# Patient Record
Sex: Male | Born: 1935 | Race: White | Hispanic: No | Smoking: Never smoker
Health system: Southern US, Community
[De-identification: ages and names within clinical notes are randomized; demographics above are authoritative.]

## PROBLEM LIST (undated history)

## (undated) DIAGNOSIS — Z87442 Personal history of urinary calculi: Secondary | ICD-10-CM

## (undated) DIAGNOSIS — I10 Essential (primary) hypertension: Secondary | ICD-10-CM

## (undated) DIAGNOSIS — E785 Hyperlipidemia, unspecified: Secondary | ICD-10-CM

## (undated) DIAGNOSIS — H269 Unspecified cataract: Secondary | ICD-10-CM

## (undated) DIAGNOSIS — I509 Heart failure, unspecified: Secondary | ICD-10-CM

## (undated) DIAGNOSIS — N200 Calculus of kidney: Secondary | ICD-10-CM

## (undated) DIAGNOSIS — R011 Cardiac murmur, unspecified: Secondary | ICD-10-CM

## (undated) DIAGNOSIS — I341 Nonrheumatic mitral (valve) prolapse: Secondary | ICD-10-CM

## (undated) DIAGNOSIS — I2510 Atherosclerotic heart disease of native coronary artery without angina pectoris: Secondary | ICD-10-CM

## (undated) DIAGNOSIS — K5790 Diverticulosis of intestine, part unspecified, without perforation or abscess without bleeding: Secondary | ICD-10-CM

## (undated) DIAGNOSIS — R339 Retention of urine, unspecified: Secondary | ICD-10-CM

## (undated) DIAGNOSIS — I219 Acute myocardial infarction, unspecified: Secondary | ICD-10-CM

## (undated) HISTORY — DX: Heart failure, unspecified: I50.9

## (undated) HISTORY — DX: Cardiac murmur, unspecified: R01.1

## (undated) HISTORY — DX: Diverticulosis of intestine, part unspecified, without perforation or abscess without bleeding: K57.90

## (undated) HISTORY — DX: Unspecified cataract: H26.9

## (undated) HISTORY — DX: Nonrheumatic mitral (valve) prolapse: I34.1

## (undated) HISTORY — PX: CORONARY ANGIOPLASTY: SHX604

## (undated) HISTORY — DX: Hyperlipidemia, unspecified: E78.5

## (undated) HISTORY — PX: CORONARY ARTERY BYPASS GRAFT: SHX141

## (undated) HISTORY — DX: Calculus of kidney: N20.0

## (undated) HISTORY — DX: Essential (primary) hypertension: I10

## (undated) HISTORY — DX: Atherosclerotic heart disease of native coronary artery without angina pectoris: I25.10

## (undated) HISTORY — PX: MITRAL VALVE REPAIR: SHX2039

## (undated) HISTORY — DX: Acute myocardial infarction, unspecified: I21.9

## (undated) HISTORY — PX: CARDIAC CATHETERIZATION: SHX172

---

## 2001-10-28 ENCOUNTER — Emergency Department (HOSPITAL_COMMUNITY): Admission: EM | Admit: 2001-10-28 | Discharge: 2001-10-28 | Payer: Self-pay | Admitting: Emergency Medicine

## 2001-10-28 ENCOUNTER — Encounter: Payer: Self-pay | Admitting: Emergency Medicine

## 2003-06-12 DIAGNOSIS — K5790 Diverticulosis of intestine, part unspecified, without perforation or abscess without bleeding: Secondary | ICD-10-CM

## 2003-06-12 HISTORY — DX: Diverticulosis of intestine, part unspecified, without perforation or abscess without bleeding: K57.90

## 2004-12-15 ENCOUNTER — Ambulatory Visit: Payer: Self-pay | Admitting: Cardiology

## 2004-12-29 ENCOUNTER — Ambulatory Visit: Payer: Self-pay

## 2005-05-26 ENCOUNTER — Ambulatory Visit: Payer: Self-pay | Admitting: Cardiology

## 2005-07-13 ENCOUNTER — Ambulatory Visit: Payer: Self-pay | Admitting: Cardiology

## 2005-11-02 ENCOUNTER — Ambulatory Visit: Payer: Self-pay | Admitting: Cardiology

## 2005-12-28 ENCOUNTER — Encounter: Payer: Self-pay | Admitting: Cardiology

## 2005-12-28 ENCOUNTER — Ambulatory Visit: Payer: Self-pay

## 2006-05-17 ENCOUNTER — Ambulatory Visit: Payer: Self-pay | Admitting: Cardiology

## 2006-11-29 ENCOUNTER — Ambulatory Visit: Payer: Self-pay | Admitting: Cardiology

## 2006-11-29 ENCOUNTER — Encounter: Payer: Self-pay | Admitting: Cardiology

## 2006-11-29 ENCOUNTER — Ambulatory Visit: Payer: Self-pay

## 2006-12-27 ENCOUNTER — Ambulatory Visit: Payer: Self-pay | Admitting: Cardiology

## 2006-12-27 ENCOUNTER — Ambulatory Visit (HOSPITAL_COMMUNITY): Admission: RE | Admit: 2006-12-27 | Discharge: 2006-12-27 | Payer: Self-pay | Admitting: Cardiology

## 2007-01-17 ENCOUNTER — Ambulatory Visit: Payer: Self-pay | Admitting: Cardiology

## 2007-02-27 ENCOUNTER — Ambulatory Visit: Payer: Self-pay | Admitting: Cardiology

## 2007-02-27 LAB — CONVERTED CEMR LAB
Chloride: 109 meq/L (ref 96–112)
Creatinine, Ser: 1.2 mg/dL (ref 0.4–1.5)
Eosinophils Absolute: 0.2 10*3/uL (ref 0.0–0.6)
Eosinophils Relative: 6.7 % — ABNORMAL HIGH (ref 0.0–5.0)
Glucose, Bld: 84 mg/dL (ref 70–99)
HCT: 38.6 % — ABNORMAL LOW (ref 39.0–52.0)
Hemoglobin: 13 g/dL (ref 13.0–17.0)
MCV: 94.6 fL (ref 78.0–100.0)
Monocytes Absolute: 0.5 10*3/uL (ref 0.2–0.7)
Neutrophils Relative %: 40.7 % — ABNORMAL LOW (ref 43.0–77.0)
RBC: 4.08 M/uL — ABNORMAL LOW (ref 4.22–5.81)
RDW: 12.6 % (ref 11.5–14.6)
Sodium: 142 meq/L (ref 135–145)
WBC: 3.5 10*3/uL — ABNORMAL LOW (ref 4.5–10.5)

## 2007-03-06 ENCOUNTER — Ambulatory Visit: Payer: Self-pay | Admitting: Cardiology

## 2007-03-06 ENCOUNTER — Inpatient Hospital Stay (HOSPITAL_BASED_OUTPATIENT_CLINIC_OR_DEPARTMENT_OTHER): Admission: RE | Admit: 2007-03-06 | Discharge: 2007-03-06 | Payer: Self-pay | Admitting: Cardiology

## 2007-03-06 ENCOUNTER — Observation Stay (HOSPITAL_COMMUNITY): Admission: RE | Admit: 2007-03-06 | Discharge: 2007-03-07 | Payer: Self-pay | Admitting: Cardiology

## 2007-03-22 ENCOUNTER — Ambulatory Visit: Payer: Self-pay | Admitting: Cardiology

## 2007-03-28 ENCOUNTER — Ambulatory Visit: Payer: Self-pay | Admitting: Thoracic Surgery (Cardiothoracic Vascular Surgery)

## 2007-04-05 ENCOUNTER — Ambulatory Visit (HOSPITAL_COMMUNITY)
Admission: RE | Admit: 2007-04-05 | Discharge: 2007-04-05 | Payer: Self-pay | Admitting: Thoracic Surgery (Cardiothoracic Vascular Surgery)

## 2007-04-05 ENCOUNTER — Encounter: Payer: Self-pay | Admitting: Thoracic Surgery (Cardiothoracic Vascular Surgery)

## 2007-04-09 ENCOUNTER — Inpatient Hospital Stay (HOSPITAL_COMMUNITY)
Admission: RE | Admit: 2007-04-09 | Discharge: 2007-04-13 | Payer: Self-pay | Admitting: Thoracic Surgery (Cardiothoracic Vascular Surgery)

## 2007-04-09 ENCOUNTER — Encounter: Payer: Self-pay | Admitting: Thoracic Surgery (Cardiothoracic Vascular Surgery)

## 2007-04-11 ENCOUNTER — Ambulatory Visit: Payer: Self-pay | Admitting: Thoracic Surgery (Cardiothoracic Vascular Surgery)

## 2007-04-15 ENCOUNTER — Encounter
Admission: RE | Admit: 2007-04-15 | Discharge: 2007-04-15 | Payer: Self-pay | Admitting: Thoracic Surgery (Cardiothoracic Vascular Surgery)

## 2007-04-15 ENCOUNTER — Ambulatory Visit: Payer: Self-pay | Admitting: Thoracic Surgery (Cardiothoracic Vascular Surgery)

## 2007-04-19 ENCOUNTER — Encounter
Admission: RE | Admit: 2007-04-19 | Discharge: 2007-04-19 | Payer: Self-pay | Admitting: Thoracic Surgery (Cardiothoracic Vascular Surgery)

## 2007-04-22 ENCOUNTER — Encounter
Admission: RE | Admit: 2007-04-22 | Discharge: 2007-04-22 | Payer: Self-pay | Admitting: Thoracic Surgery (Cardiothoracic Vascular Surgery)

## 2007-04-22 ENCOUNTER — Ambulatory Visit: Payer: Self-pay | Admitting: Thoracic Surgery (Cardiothoracic Vascular Surgery)

## 2007-04-25 ENCOUNTER — Ambulatory Visit: Payer: Self-pay | Admitting: Cardiovascular Disease

## 2007-04-25 ENCOUNTER — Ambulatory Visit: Payer: Self-pay

## 2007-04-25 ENCOUNTER — Encounter: Payer: Self-pay | Admitting: Cardiology

## 2007-05-01 ENCOUNTER — Ambulatory Visit: Payer: Self-pay | Admitting: Cardiology

## 2007-05-02 LAB — CONVERTED CEMR LAB
BUN: 31 mg/dL — ABNORMAL HIGH (ref 6–23)
Basophils Absolute: 0.2 10*3/uL — ABNORMAL HIGH (ref 0.0–0.1)
Eosinophils Absolute: 1.5 10*3/uL — ABNORMAL HIGH (ref 0.0–0.6)
GFR calc Af Amer: 63 mL/min
GFR calc non Af Amer: 52 mL/min
Hemoglobin: 11.1 g/dL — ABNORMAL LOW (ref 13.0–17.0)
Lymphocytes Relative: 18.4 % (ref 12.0–46.0)
MCHC: 32.3 g/dL (ref 30.0–36.0)
Monocytes Absolute: 0.4 10*3/uL (ref 0.2–0.7)
Monocytes Relative: 6.9 % (ref 3.0–11.0)
Neutro Abs: 2.7 10*3/uL (ref 1.4–7.7)
Potassium: 4.8 meq/L (ref 3.5–5.1)

## 2007-05-08 ENCOUNTER — Encounter: Admission: RE | Admit: 2007-05-08 | Discharge: 2007-05-08 | Payer: Self-pay | Admitting: Cardiology

## 2007-05-10 ENCOUNTER — Ambulatory Visit: Payer: Self-pay | Admitting: Cardiology

## 2007-05-14 ENCOUNTER — Ambulatory Visit: Payer: Self-pay | Admitting: Cardiology

## 2007-05-14 LAB — CONVERTED CEMR LAB
Basophils Absolute: 0.1 10*3/uL (ref 0.0–0.1)
Chloride: 106 meq/L (ref 96–112)
Creatinine, Ser: 1.3 mg/dL (ref 0.4–1.5)
HCT: 34.8 % — ABNORMAL LOW (ref 39.0–52.0)
MCHC: 32.3 g/dL (ref 30.0–36.0)
Neutrophils Relative %: 47.2 % (ref 43.0–77.0)
RBC: 3.71 M/uL — ABNORMAL LOW (ref 4.22–5.81)
RDW: 13.6 % (ref 11.5–14.6)
Sodium: 140 meq/L (ref 135–145)
WBC: 4.7 10*3/uL (ref 4.5–10.5)

## 2007-05-20 ENCOUNTER — Encounter
Admission: RE | Admit: 2007-05-20 | Discharge: 2007-05-20 | Payer: Self-pay | Admitting: Thoracic Surgery (Cardiothoracic Vascular Surgery)

## 2007-05-20 ENCOUNTER — Ambulatory Visit: Payer: Self-pay | Admitting: Thoracic Surgery (Cardiothoracic Vascular Surgery)

## 2007-05-28 ENCOUNTER — Encounter (HOSPITAL_COMMUNITY): Admission: RE | Admit: 2007-05-28 | Discharge: 2007-08-26 | Payer: Self-pay | Admitting: Cardiology

## 2007-06-18 ENCOUNTER — Ambulatory Visit: Payer: Self-pay | Admitting: Cardiology

## 2007-06-18 LAB — CONVERTED CEMR LAB
BUN: 28 mg/dL — ABNORMAL HIGH (ref 6–23)
Basophils Relative: 0 % (ref 0.0–1.0)
Calcium: 9.5 mg/dL (ref 8.4–10.5)
Creatinine, Ser: 1.2 mg/dL (ref 0.4–1.5)
Eosinophils Absolute: 0.2 10*3/uL (ref 0.0–0.7)
Eosinophils Relative: 4.8 % (ref 0.0–5.0)
GFR calc Af Amer: 76 mL/min
GFR calc non Af Amer: 63 mL/min
HCT: 38.8 % — ABNORMAL LOW (ref 39.0–52.0)
MCV: 91.4 fL (ref 78.0–100.0)
Monocytes Absolute: 0.7 10*3/uL (ref 0.1–1.0)
Neutro Abs: 1.9 10*3/uL (ref 1.4–7.7)
Platelets: 216 10*3/uL (ref 150–400)
RBC: 4.25 M/uL (ref 4.22–5.81)
WBC: 4 10*3/uL — ABNORMAL LOW (ref 4.5–10.5)

## 2007-07-26 ENCOUNTER — Ambulatory Visit: Payer: Self-pay | Admitting: Cardiology

## 2007-07-26 LAB — CONVERTED CEMR LAB
Basophils Absolute: 0 10*3/uL (ref 0.0–0.1)
CO2: 28 meq/L (ref 19–32)
GFR calc Af Amer: 76 mL/min
Glucose, Bld: 137 mg/dL — ABNORMAL HIGH (ref 70–99)
Lymphocytes Relative: 32 % (ref 12.0–46.0)
Monocytes Relative: 13.8 % — ABNORMAL HIGH (ref 3.0–12.0)
Platelets: 200 10*3/uL (ref 150–400)
Potassium: 4.3 meq/L (ref 3.5–5.1)
RDW: 13.6 % (ref 11.5–14.6)
Sodium: 141 meq/L (ref 135–145)

## 2007-07-31 ENCOUNTER — Ambulatory Visit: Payer: Self-pay | Admitting: Cardiology

## 2007-08-28 ENCOUNTER — Encounter (HOSPITAL_COMMUNITY): Admission: RE | Admit: 2007-08-28 | Discharge: 2007-11-26 | Payer: Self-pay | Admitting: Cardiology

## 2007-10-23 ENCOUNTER — Encounter: Payer: Self-pay | Admitting: Cardiology

## 2007-10-23 ENCOUNTER — Ambulatory Visit: Payer: Self-pay

## 2007-10-30 ENCOUNTER — Ambulatory Visit: Payer: Self-pay | Admitting: Cardiology

## 2007-11-18 ENCOUNTER — Ambulatory Visit: Payer: Self-pay | Admitting: Thoracic Surgery (Cardiothoracic Vascular Surgery)

## 2007-11-28 ENCOUNTER — Encounter (HOSPITAL_COMMUNITY): Admission: RE | Admit: 2007-11-28 | Discharge: 2008-02-24 | Payer: Self-pay | Admitting: Cardiology

## 2008-02-28 ENCOUNTER — Encounter (HOSPITAL_COMMUNITY): Admission: RE | Admit: 2008-02-28 | Discharge: 2008-04-27 | Payer: Self-pay | Admitting: Cardiology

## 2008-03-11 ENCOUNTER — Ambulatory Visit: Payer: Self-pay | Admitting: Cardiology

## 2008-03-11 DIAGNOSIS — E785 Hyperlipidemia, unspecified: Secondary | ICD-10-CM | POA: Insufficient documentation

## 2008-03-11 DIAGNOSIS — I25810 Atherosclerosis of coronary artery bypass graft(s) without angina pectoris: Secondary | ICD-10-CM | POA: Insufficient documentation

## 2008-03-11 DIAGNOSIS — E7800 Pure hypercholesterolemia, unspecified: Secondary | ICD-10-CM | POA: Insufficient documentation

## 2008-03-25 ENCOUNTER — Ambulatory Visit: Payer: Self-pay | Admitting: Internal Medicine

## 2008-03-25 ENCOUNTER — Encounter: Payer: Self-pay | Admitting: Internal Medicine

## 2008-07-16 ENCOUNTER — Ambulatory Visit: Payer: Self-pay | Admitting: Cardiology

## 2008-07-16 DIAGNOSIS — I059 Rheumatic mitral valve disease, unspecified: Secondary | ICD-10-CM | POA: Insufficient documentation

## 2008-07-21 ENCOUNTER — Telehealth: Payer: Self-pay | Admitting: Cardiology

## 2008-08-03 IMAGING — CR DG CHEST 2V
2 series · 2 of 2 positions shown · non-contrast
Comparison: 05/08/07

CLINICAL DATA: Post CABG with cough and shortness of breath. 
 CHEST - 2 VIEW:

[w chest pa]
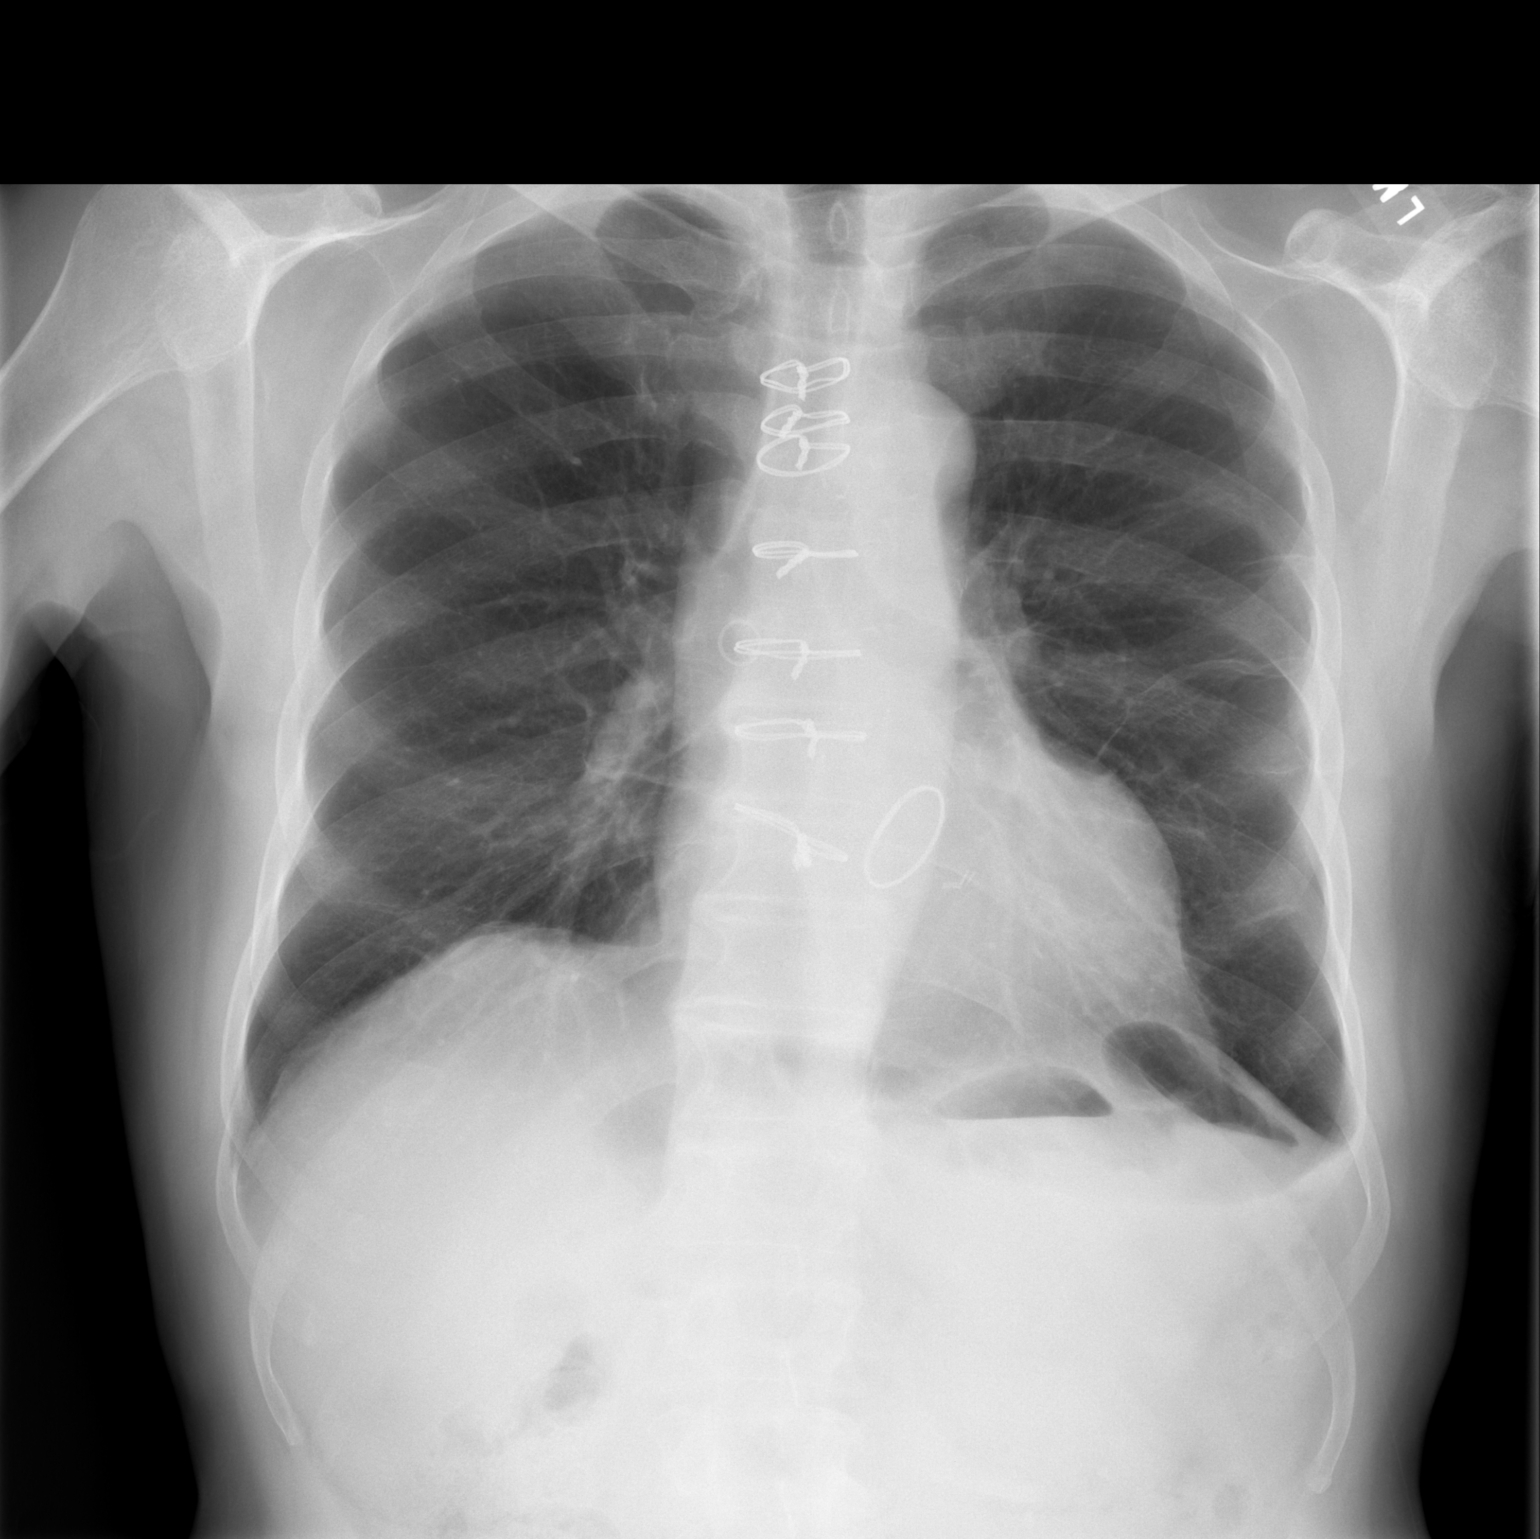

[w chest lat]
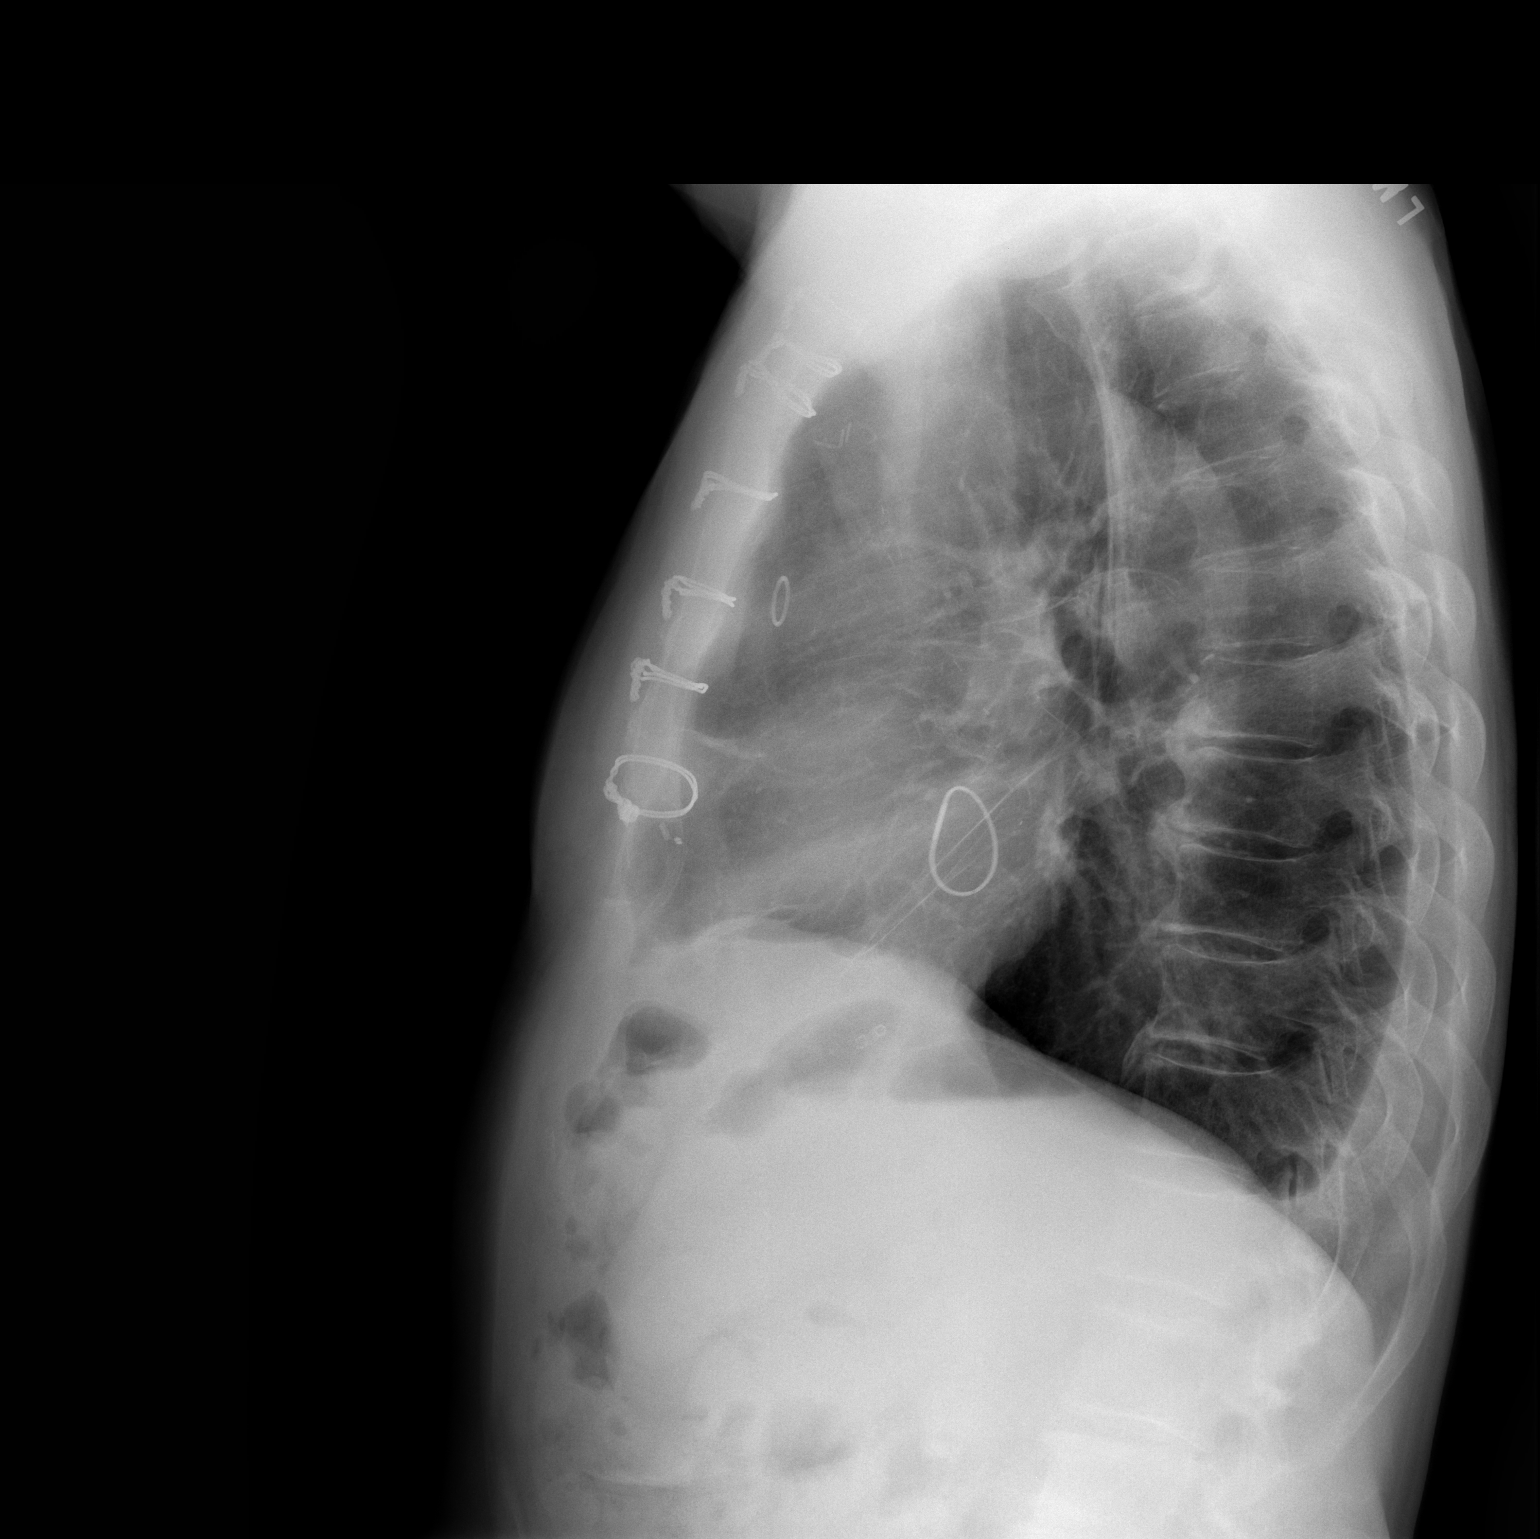

[2 of 2 positions shown; findings below may reference images not displayed]

FINDINGS: Trachea is midline.  Heart size stable.  Sternotomy wires are unchanged in position and alignment.  Pleural parenchymal scarring is seen in the left hemithorax.  Cannot exclude a tiny left pleural effusion.  Right lung clear.
IMPRESSION: Pleural parenchymal scarring in the left hemithorax.  Cannot exclude a tiny left pleural effusion.

## 2008-12-31 ENCOUNTER — Telehealth: Payer: Self-pay | Admitting: Cardiology

## 2009-01-13 ENCOUNTER — Ambulatory Visit: Payer: Self-pay | Admitting: Cardiology

## 2009-01-13 DIAGNOSIS — R0602 Shortness of breath: Secondary | ICD-10-CM | POA: Insufficient documentation

## 2009-01-28 ENCOUNTER — Encounter: Payer: Self-pay | Admitting: Cardiology

## 2009-01-28 ENCOUNTER — Ambulatory Visit: Payer: Self-pay

## 2009-01-28 ENCOUNTER — Ambulatory Visit: Payer: Self-pay | Admitting: Cardiology

## 2009-01-28 ENCOUNTER — Ambulatory Visit (HOSPITAL_COMMUNITY): Admission: RE | Admit: 2009-01-28 | Discharge: 2009-01-28 | Payer: Self-pay | Admitting: Cardiology

## 2009-02-09 LAB — CONVERTED CEMR LAB
CO2: 28 meq/L (ref 19–32)
Chloride: 108 meq/L (ref 96–112)
Creatinine, Ser: 1.2 mg/dL (ref 0.4–1.5)
Potassium: 4.9 meq/L (ref 3.5–5.1)

## 2009-06-30 ENCOUNTER — Ambulatory Visit: Payer: Self-pay | Admitting: Cardiology

## 2009-11-25 ENCOUNTER — Telehealth: Payer: Self-pay | Admitting: Cardiology

## 2009-12-22 ENCOUNTER — Ambulatory Visit: Payer: Self-pay | Admitting: Ophthalmology

## 2009-12-22 ENCOUNTER — Telehealth (INDEPENDENT_AMBULATORY_CARE_PROVIDER_SITE_OTHER): Payer: Self-pay | Admitting: *Deleted

## 2009-12-29 ENCOUNTER — Ambulatory Visit: Payer: Self-pay | Admitting: Ophthalmology

## 2010-01-19 ENCOUNTER — Ambulatory Visit: Payer: Self-pay | Admitting: Internal Medicine

## 2010-01-19 ENCOUNTER — Encounter: Payer: Self-pay | Admitting: Cardiology

## 2010-01-19 ENCOUNTER — Ambulatory Visit: Payer: Self-pay | Admitting: Cardiology

## 2010-01-19 ENCOUNTER — Ambulatory Visit: Payer: Self-pay

## 2010-01-19 ENCOUNTER — Ambulatory Visit (HOSPITAL_COMMUNITY): Admission: RE | Admit: 2010-01-19 | Discharge: 2010-01-19 | Payer: Self-pay | Admitting: Cardiology

## 2010-03-27 LAB — CONVERTED CEMR LAB
BUN: 24 mg/dL — ABNORMAL HIGH (ref 6–23)
Chloride: 106 meq/L (ref 96–112)
Magnesium: 2.3 mg/dL (ref 1.5–2.5)
Potassium: 4.2 meq/L (ref 3.5–5.1)
Sodium: 138 meq/L (ref 135–145)

## 2010-03-29 NOTE — Assessment & Plan Note (Signed)
Summary: [name] [name]   Visit Type:  Follow-up Primary Provider:  [facility]   History of Present Illness: no cardiology complaints patient had a echo done today 6 mth fu.  Overall he is doing pretty well.  He is able to do most things without any significant symptoms.  He has mild shortness of breath when he goes up stairs or does alot of activity, but it is not limiting.  Echo done today in office.   Current Medications (verified): 1)  Plavix 75 Mg Tabs (Clopidogrel Bisulfate) .... Take One Tablet By Mouth Daily 2)  Metoprolol Succinate 25 Mg Xr24h-Tab (Metoprolol Succinate) .... Take One Tablet By Mouth Daily 3)  Potassium Chloride Cr 10 Meq Cr-Tabs (Potassium Chloride) .Marland Kitchen.. 1 Tab Every Other Day 4)  Simvastatin 80 Mg Tabs (Simvastatin) .... Take 1/2 Tablet At Bedtime 5)  Furosemide 20 Mg Tabs (Furosemide) .... Take One Tablet By Mouth Every Other Day. 6)  Multivitamins  Tabs (Multiple Vitamin) .... Take 1 Tablet By Mouth Once A Day 7)  Aspirin 81 Mg Tbec (Aspirin) .... Take One Tablet By Mouth Daily 8)  Triazolam 0.25 Mg Tabs (Triazolam) .... Take 1 Tab By Mouth At Bedtime  Allergies (verified): No Known Drug Allergies  Comments:  Nurse/Medical Assistant: patient brought med list we reviewed stated all meds are correct  Past History:  Past Medical History: Last updated: 03/11/2008 hx of acute mi v12.59 hx of murmurs 785.2 hx of kidney  stones CAD Hyperlipidemia Hypertension  Past Surgical History: Last updated: 03/11/2008 CABG 2/09 Valve Repair-Mitral (S/P) 2/09  Family History: Last updated: 03/11/2008 Family History of Hypertension:   Social History: Last updated: 03/11/2008 Full Time Married  Tobacco Use - No.   Vital Signs:  Patient profile:   77 year old male Weight:      154 pounds BMI:     23.50 Pulse rate:   44 / minute BP sitting:   122 / 60  (right arm)  Vitals Entered By: Dreama Saa, CNA (January 19, 2010 11:31 AM)  Physical Exam  General:   Well developed, well nourished, in no acute distress. Head:  normocephalic and atraumatic Eyes:  PERRLA/EOM intact; conjunctiva and lids normal. Lungs:  Clear bilaterally to auscultation and percussion. Heart:  Normal S1 and S2. 1-2/6  SEM most notable at apical area.   No evident DM.   No rub or gallop. Pulses:  pulses normal in all 4 extremities Extremities:  No clubbing or cyanosis. Neurologic:  Alert and oriented x 3.   EKG  Procedure date:  01/19/2010  Findings:      Sinus bradycardia.  Incomplete RBBB.  leftward axis.  Echocardiogram  Procedure date:  01/19/2010  Findings:       Left ventricle: The cavity size was normal. Wall thickness was       increased in a pattern of moderate LVH. Systolic function was       normal. The estimated ejection fraction was in the range of 50% to       55%. Hypokinesis of the inferior and inferoseptal myocardium.     - Ventricular septum: Septal motion showed abnormal function and       dyssynergy.     - Aortic valve: Transvalvular velocity was minimally increased.       There was very mild stenosis. Mild regurgitation. Valve area:       2.1cm 2(VTI). Valve area: 2.24cm 2 (Vmax).     - Mitral valve: The findings are consistent with mild to  moderate       stenosis. Valve area by pressure half-time: 1.33cm 2. Valve area       by continuity equation (using LVOT flow): 1.9cm 2.     - Left atrium: The atrium was moderately dilated.     - Right atrium: The atrium was moderately dilated.  Impression & Recommendations:  Problem # 1:  MITRAL VALVE PROLAPSE, NON-RHEUMATIC (ICD-424.0) status post repair with findings on echo as noted.  Mild exertional dyspnea poss secondary residual impact of valve repair.  Not limited in significant way. His updated medication list for this problem includes:    Metoprolol Succinate 25 Mg Xr24h-tab (Metoprolol succinate) .Marland Kitchen... Take one tablet by mouth daily    Furosemide 20 Mg Tabs (Furosemide) .Marland Kitchen... Take one  tablet by mouth every other day.  Problem # 2:  CAD, ARTERY BYPASS GRAFT (ICD-414.04) No current angina. stable overall status His updated medication list for this problem includes:    Plavix 75 Mg Tabs (Clopidogrel bisulfate) .Marland Kitchen... Take one tablet by mouth daily    Metoprolol Succinate 25 Mg Xr24h-tab (Metoprolol succinate) .Marland Kitchen... Take one tablet by mouth daily    Aspirin 81 Mg Tbec (Aspirin) .Marland Kitchen... Take one tablet by mouth daily  Problem # 3:  HYPERLIPIDEMIA-MIXED (ICD-272.4) Followed by Dr. Chilton Si.  His updated medication list for this problem includes:    Simvastatin 80 Mg Tabs (Simvastatin) .Marland Kitchen... Take 1/2 tablet at bedtime  Patient Instructions: 1)  Your physician recommends that you schedule a follow-up appointment in: 6 months. 2)  Your physician recommends that you continue on your current medications as directed. Please refer to the Current Medication list given to you today.

## 2010-03-29 NOTE — Progress Notes (Signed)
Summary: Records Request  Faxed EKG to [facility] at [facility] Pre-Admit Testing ([phone]). Debby Freiberg  December 22, 2009 12:43 PM

## 2010-03-29 NOTE — Progress Notes (Signed)
Summary: pt's eye dr calling re going off plavix for 7 days  Phone Note From Other Clinic   Caller: mary  Summary of Call: mary with the pt's eye dr and they need her to be off plavix for 7 days pls call [phone] or fax to [phone] Initial call taken by: Glynda Jaeger,  November 25, 2009 2:40 PM  Follow-up for Phone Call        [facility] not in office today. Left message on nurse line to call back. Dossie Arbour, RN, BSN  November 26, 2009 8:43 AM   Additional Follow-up for Phone Call Additional follow up Details #1::        The patients records were reviewed in detail.  Has DES in CFX and PDA, but CABG to these vessels for mod disease.  He could come off of PLavix for seven days.  The relative contribution of stents vs bypass for blood supply might be unknown, so I would favor continuing ASA through procedure.   Additional Follow-up by: Ronaldo Miyamoto, MD, [facility],  November 29, 2009 11:08 AM     Appended Document: pt's eye dr calling re going off plavix for 7 days**LMTCB** The correct fax number is [phone]. I left a message for the pt to call our office in regards to instructions on plavix.   Appended Document: pt's eye dr calling re going off plavix for 7 days**LMTCB** I spoke with the pt and made him aware that he can hold Plavix for 7 days, but CANNOT hold ASA for procedure.  Pt agreed with plan.

## 2010-03-29 NOTE — Assessment & Plan Note (Signed)
Summary: f54m   Visit Type:  6 months folow up Primary Provider:  [facility]  CC:  No complains.  History of Present Illness: [name] [name] is in for followup.  He continues to do well, but does note a very mild degree of shortness of breath at higher levels of activity, such as going up stairs.  Denies chest pain, and his overall functional status remains quite good.  No major symptoms at this point.    Current Medications (verified): 1)  Plavix 75 Mg Tabs (Clopidogrel Bisulfate) .... Take One Tablet By Mouth Daily 2)  Metoprolol Succinate 25 Mg Xr24h-Tab (Metoprolol Succinate) .... Take One Tablet By Mouth Daily 3)  Potassium Chloride Cr 10 Meq Cr-Tabs (Potassium Chloride) .Marland Kitchen.. 1 Tab Every Other Day 4)  Simvastatin 80 Mg Tabs (Simvastatin) .... Take 1/2 Tablet At Bedtime 5)  Furosemide 20 Mg Tabs (Furosemide) .... Take One Tablet By Mouth Every Other Day. 6)  Multivitamins  Tabs (Multiple Vitamin) .... Take 1 Tablet By Mouth Once A Day 7)  Aspirin 81 Mg Tbec (Aspirin) .... Take One Tablet By Mouth Daily 8)  Triazolam 0.25 Mg Tabs (Triazolam) .... Take 1 Tab By Mouth At Bedtime  Allergies (verified): No Known Drug Allergies  Vital Signs:  Patient profile:   78 year old male Height:      68 inches Weight:      156.50 pounds BMI:     23.88 Pulse rate:   55 / minute Pulse rhythm:   irregular Resp:     18 per minute BP sitting:   140 / 76  (left arm) Cuff size:   large  Vitals Entered By: Vikki Ports (Jun 30, 2009 3:13 PM)  Physical Exam  General:  Well developed, well nourished, in no acute distress. Head:  normocephalic and atraumatic Eyes:  PERRLA/EOM intact; conjunctiva and lids normal. Lungs:  Clear bilaterally to auscultation and percussion. Heart:  PMI non displaced.  Apical murmur 2/6 no significant diastolic murmur.  No rub or S3 gallop. Abdomen:  Bowel sounds positive; abdomen soft and non-tender without masses, organomegaly, or hernias noted. No  hepatosplenomegaly. Pulses:  pulses normal in all 4 extremities Extremities:  No clubbing or cyanosis.  No edema. Neurologic:  Alert and oriented x 3.   EKG  Procedure date:  06/30/2009  Findings:      Sinus bradycardia.  Incomplete RBBB.  Echocardiogram  Procedure date:  01/28/2009  Findings:              - Left ventricle: The cavity size was normal. Wall thickness was       increased in a pattern of mild LVH. Systolic function was normal.       The estimated ejection fraction was in the range of 55% to 60%.       There does appear to be some septal-lateral dyssynchrony. Wall       motion was normal; there were no regional wall motion       abnormalities. Features are consistent with a pseudonormal left       ventricular filling pattern, with concomitant abnormal relaxation       and increased filling pressure (grade 2 diastolic dysfunction).     - Aortic valve: Trileaflet; moderately calcified leaflets. Sclerosis       without stenosis. Trivial regurgitation.     - Aorta: Mildly dilated aortic root. Aortic root dimension: 42mm       (ED).     - Mitral valve: Status post  mitral valve repair with annuloplasty       ring. Minimal mitral stenosis with mean gradient 3 mmHg and MVA by       PHT 1.34 cm 2. Trivial regurgitation. Valve area by pressure       half-time: 1.34cm 2.     - Left atrium: The atrium was mildly dilated.     - Right ventricle: The cavity size was normal. Systolic function was       normal.     - Pulmonary arteries: PA peak pressure: 32mm Hg (S).     - Inferior vena cava: The vessel was normal in size; the       respirophasic diameter changes were in the normal range (= 50%) ;       findings are consistent with normal central venous pressure.     Impressions:         Impression & Recommendations:  Problem # 1:  MITRAL VALVE PROLAPSE, NON-RHEUMATIC (ICD-424.0) sp repair.  Apical murmur 2/6---see echo, no sig MR.  LV diastolic abnormality.  Continue  every other day diuretic and K supplement.  repeat echo in December OV His updated medication list for this problem includes:    Metoprolol Succinate 25 Mg Xr24h-tab (Metoprolol succinate) .Marland Kitchen... Take one tablet by mouth daily    Furosemide 20 Mg Tabs (Furosemide) .Marland Kitchen... Take one tablet by mouth every other day.  Orders: EKG w/ Interpretation (93000)  Problem # 2:  DYSPNEA (ICD-786.05) Very mild, prob combo of diastolic and other findings. His updated medication list for this problem includes:    Metoprolol Succinate 25 Mg Xr24h-tab (Metoprolol succinate) .Marland Kitchen... Take one tablet by mouth daily    Furosemide 20 Mg Tabs (Furosemide) .Marland Kitchen... Take one tablet by mouth every other day.    Aspirin 81 Mg Tbec (Aspirin) .Marland Kitchen... Take one tablet by mouth daily  Orders: EKG w/ Interpretation (93000)  Problem # 3:  HYPERCHOLESTEROLEMIA  IIA (ICD-272.0) needs lipid and liver profile.  Sees Dr. Chilton Si next month.   His updated medication list for this problem includes:    Simvastatin 80 Mg Tabs (Simvastatin) .Marland Kitchen... Take 1/2 tablet at bedtime  Problem # 4:  HYPERCHOLESTEROLEMIA  IIA (ICD-272.0)  His updated medication list for this problem includes:    Simvastatin 80 Mg Tabs (Simvastatin) .Marland Kitchen... Take 1/2 tablet at bedtime  Patient Instructions: 1)  Your physician recommends that you continue on your current medications as directed. Please refer to the Current Medication list given to you today. 2)  Your physician wants you to follow-up in: 6 MONTHS.  You will receive a reminder letter in the mail two months in advance. If you don't receive a letter, please call our office to schedule the follow-up appointment. 3)  Your physician has requested that you have an echocardiogram in 6 MONTHS.  Echocardiography is a painless test that uses sound waves to create images of your heart. It provides your doctor with information about the size and shape of your heart and how well your heart's chambers and valves are working.   This procedure takes approximately one hour. There are no restrictions for this procedure.

## 2010-06-29 ENCOUNTER — Encounter: Payer: Self-pay | Admitting: Cardiology

## 2010-06-30 ENCOUNTER — Encounter: Payer: Self-pay | Admitting: Cardiology

## 2010-06-30 ENCOUNTER — Ambulatory Visit (INDEPENDENT_AMBULATORY_CARE_PROVIDER_SITE_OTHER): Payer: Self-pay | Admitting: Cardiology

## 2010-06-30 VITALS — BP 120/80 | HR 48 | Resp 18 | Ht 68.0 in | Wt 151.1 lb

## 2010-06-30 DIAGNOSIS — I2510 Atherosclerotic heart disease of native coronary artery without angina pectoris: Secondary | ICD-10-CM

## 2010-06-30 DIAGNOSIS — Z9889 Other specified postprocedural states: Secondary | ICD-10-CM

## 2010-06-30 DIAGNOSIS — I25810 Atherosclerosis of coronary artery bypass graft(s) without angina pectoris: Secondary | ICD-10-CM

## 2010-06-30 NOTE — Patient Instructions (Signed)
Your physician recommends that you schedule a follow-up appointment in: 6 MONTHS   Your physician recommends that you continue on your current medications as directed. Please refer to the Current Medication list given to you today.  

## 2010-07-07 DIAGNOSIS — I2510 Atherosclerotic heart disease of native coronary artery without angina pectoris: Secondary | ICD-10-CM | POA: Insufficient documentation

## 2010-07-07 DIAGNOSIS — Z9889 Other specified postprocedural states: Secondary | ICD-10-CM | POA: Insufficient documentation

## 2010-07-07 NOTE — Assessment & Plan Note (Signed)
Overall seems to be doing well.  Has very mild shortness of breath going up stairs or hills.  Would continue medical treatment.

## 2010-07-07 NOTE — Assessment & Plan Note (Signed)
See my note.  Basically he is asymptomatic.  DAPT continues because of first generation stents and their indications.  Not bleeding.  Discussed at length.

## 2010-07-07 NOTE — Progress Notes (Signed)
HPI:  He really is doing pretty well, and he and his wife and I had a visit with a thorough discussion today.  In specific, we reviewed the issues related to use of DAPT, and why we would consider continuing this.  Potential for graft occlusion early on in a patent stented area, combined with DES first generation in an ACS, particularly STEMI environment constituted the main reason.  He denies other ongoing problems at present.  He does note that when he climbs stairs he gets short of breath, and I reviewed this with him in detail.  Current Outpatient Prescriptions  Medication Sig Dispense Refill  . aspirin 81 MG tablet Take 81 mg by mouth daily.        . clopidogrel (PLAVIX) 75 MG tablet Take 75 mg by mouth daily.        . furosemide (LASIX) 20 MG tablet Take 20 mg by mouth every other day.       . metoprolol succinate (TOPROL-XL) 25 MG 24 hr tablet Take 25 mg by mouth daily.        . Multiple Vitamin (MULTIVITAMIN) tablet Take 1 tablet by mouth daily.        . potassium chloride (KLOR-CON) 10 MEQ CR tablet Take 10 mEq by mouth every other day.       . simvastatin (ZOCOR) 40 MG tablet Take 40 mg by mouth at bedtime.        . triazolam (HALCION) 0.25 MG tablet Take 0.25 mg by mouth at bedtime as needed.          No Known Allergies  Past Medical History  Diagnosis Date  . Acute MI   . Undiagnosed cardiac murmurs   . Kidney stones   . Coronary artery disease   . Hyperlipidemia   . Hypertension     Past Surgical History  Procedure Date  . Coronary artery bypass graft     2/09  . Mitral valve repair     2/09    Family History  Problem Relation Age of Onset  . Hypertension Other     History   Social History  . Marital Status: Married    Spouse Name: N/A    Number of Children: N/A  . Years of Education: N/A   Occupational History  . full time    Social History Main Topics  . Smoking status: Never Smoker   . Smokeless tobacco: Not on file  . Alcohol Use: Not on file  .  Drug Use: Not on file  . Sexually Active: Not on file   Other Topics Concern  . Not on file   Social History Narrative  . No narrative on file    ROS: Please see the HPI.  All other systems reviewed and negative.  PHYSICAL EXAM:  BP 120/80  Pulse 48  Resp 18  Ht 5\' 8"  (1.727 m)  Wt 151 lb 1.9 oz (68.548 kg)  BMI 22.98 kg/m2  General: Well developed, well nourished, in no acute distress. Head:  Normocephalic and atraumatic. Neck: no JVD Lungs: Clear to auscultation and percussion. Heart: Normal S1 and S2.  SEM at apex.  Not holosystolic. No rub Abdomen:  Normal bowel sounds; soft; non tender; no organomegaly Pulses: Pulses normal in all 4 extremities. Extremities: No clubbing or cyanosis. No edema. Neurologic: Alert and oriented x 3.  EKG:  Marked SB.  Incomplete RBBB.    ASSESSMENT AND PLAN:

## 2010-07-12 NOTE — H&P (Signed)
HISTORY AND PHYSICAL EXAMINATION   March 28, 2007   Re:  [name] [name], [name] [name]            DOB:  10-May-1928   Date of planned hospital admission April 09, 2007.   PRESENTING CHIEF COMPLAINT:  Exertional shortness of breath.   HISTORY OF PRESENT ILLNESS:  [name] is a 77 year old gentleman from  [facility] followed by Dr. Shawnie Pons and Dr. Nila Nephew with a  known history of mitral valve prolapse with mitral regurgitation and  coronary artery disease.  The patient reports that he first developed  symptoms of mild exertional shortness of breath five or six years ago.  He was noted to have a murmur on physical exam. An echocardiogram  revealed mitral valve prolapse with mitral regurgitation. This has been  followed conservatively since that time. In 2006, the patient suffered  an acute myocardial infarction complicated by V-fib arrest requiring DC  cardioversion in the field. 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  This occurred  in [place], [place] where he underwent percutaneous coronary  intervention and stenting of the left circumflex coronary artery in the  posterior descending coronary artery.  Since then, he has done well from  a cardiac standpoint. [name] continues to report stable symptoms of  class 2 exertional shortness of breath. He remains quite active  physically, and shortness of breath only occurs with fairly strenuous  activity. He denies any chest pain, chest tightness, chest pressure  eight with activity or at rest. He denies any resting shortness of  breath, PND, orthopnea, lower extremity edema, syncope, palpitations. He  has been followed recently by Dr. Riley Kill. Regular echocardiograms have  revealed progression of mitral regurgitation. In October, he underwent  transesophageal echocardiogram, which confirmed the presence of severe  (4+) mitral regurgitation. He  recently underwent follow up left and  right heart catheterization on March 06, 2007, by Dr. Riley Kill. This  also revealed progression of coronary artery disease with significant  three-vessel coronary artery disease. Pulmonary artery pressures  remained normal. [name] has now been referred for elective mitral  valve repair and coronary artery bypass grafting.   REVIEW OF SYSTEMS:  GENERAL: The patient reports overall feeling well.  His appetite is good. He has not been gaining or losing weight. He is 5  foot 8 inches tall and weighs roughly 158 pounds. He remains quite  active physically.  CARDIAC: Notable for stable symptoms of exertional shortness of breath.  RESPIRATORY: Notable for the absence of productive cough, hemoptysis,  wheezing.  GASTROINTESTINAL: Negative. The patient has no difficulty swallowing. He  denies hematochezia, hematemesis, melena.  GENITOURINARY: Negative. The patient denies urinary urgency or  frequency.  PERIPHERAL VASCULAR: Negative. The patient denies symptoms suggestive of  claudication. He has had sclerotherapy on varicose veins in both lower  legs in the past.  NEUROLOGICAL: Negative. The patient denies symptoms suggestive of  previous TIA or stroke.  MUSCULOSKELETAL: Negative. The patient denies significant problems with  arthritis or arthralgias.  PSYCHIATRIC: Negative.  HEENT: Negative. The patient sees his dentist regularly and has been  treated with routine antibiotics for dental prophylaxis in the past.  HEMATOLOGICAL: Negative. The patient denies any bleeding problems,  although he has been on Plavix since 2006.   PAST MEDICAL HISTORY:  1. Mitral valve prolapse with severe mitral regurgitation.  2. Coronary artery disease, status post acute myocardial infarction  July 2006. Complicated by V-fib arrest.  3. Hypertension.  4. Hyperlipidemia.  5. Kidney stones.   PAST SURGICAL HISTORY:  Superficial sclerotherapy for bilateral lower   extremity varicose veins.   FAMILY HISTORY:  Notable for the absence of premature coronary disease,  although several family members have had heart attacks and congestive  heart failure.   SOCIAL HISTORY:  The patient is married and lives with his wife here in  [place].  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  This requires a fair  amount of traveling. He remains very active physically and exercises  regularly.  He is a nonsmoker. He denies excessive alcohol consumption.   CURRENT MEDICATIONS:  1. Plavix 75 mg daily.  2. Aspirin 81 mg daily.  3. Lisinopril 5 mg daily.  4. Zocor 80 mg daily.  5. Multi-vitamin  one tablet daily.   DRUG ALLERGIES:  None known.   PHYSICAL EXAMINATION:  The patient is a well-appearing male, who appears  somewhat younger than his stated age in no acute distress.  Blood  pressure is 160/75. Pulse 44. Oxygen saturation 99% on room air.Marland Kitchen He is  afebrile.  HEENT EXAM: Is grossly unrevealing. The neck is supple. There  is no cervical nor supraclavicular lymphadenopathy. There is no jugular  venous distention. No carotid bruits are noted. Auscultation of the  chest demonstrates clear breath sounds, which are symmetrical  bilaterally. No wheezes or rhonchi are noted. CARDIOVASCULAR EXAM:  Reveals regular rate and rhythm. There is a prominent grade 4/6  holosystolic murmur heard at the apex with radiation all across the  precordium into the axilla and back.  No diastolic murmurs are noted.  ABDOMEN: Is soft and nontender. There are no palpable masses. Bowel  sounds are present. EXTREMITIES: Are warm and well perfused.  There is  no lower extremity edema. Distal pulses are palpable in the dorsalis  pedis position bilaterally. There are mild changes of superficial spider  veins and varicose veins in both lower legs with some scars from what  appears to be sclerotherapy. There are no scars on  the thighs. No signs  of significant varicosities above the knee.  RECTAL AND GU EXAMS: Are  both deferred. NEUROLOGIC EXAMINATION: Is grossly nonfocal and  symmetrical throughout.   DIAGNOSTIC TESTS:  Transesophageal echocardiogram performed December 27, 2006, by Dr. Simona Huh is reviewed.  This demonstrates severe (4+)  mitral regurgitation. There is mitral valve prolapse with a small flail  segment of what is probably a portion of the posterior leaflet that  seems to be centrally located towards the anterior commissure.  Both the  anterior and posterior leaflets have some additional prolapse redundancy  and thickening of the leaflets themselves. There is a broad jet of  mitral regurgitation with estimated regurgitant volume of 149 ml  corresponding to an effective regurgitant orifice area of 0.64.  The  aortic valve appears normal with trivial aortic regurgitation.  Left  ventricular size and function appears normal. There is mild left  ventricular hypertrophy. There is some atheroma in the descending  thoracic aorta.   Cardiac catheterization performed March 06, 2007, by Dr. Riley Kill is  reviewed.  This demonstrates three-vessel coronary artery disease with  at least moderate mitral regurgitation.  The left anterior descending  coronary artery does not have any significant flow limiting lesions.  There is 70 to 80% proximal stenosis of the first diagonal branch. There  is long segment 70% stenosis of the mid left circumflex coronary artery  involving bifurcation with the large circumflex marginal branch.  There  is diffuse atheroma in the right coronary artery, although only 30 to  40% lesions in the proximal and  mid portion.  There is long segment 60  to 70% stenosis in the distal right coronary artery. The posterior  descending coronary artery has a patent stent proximally. There is right  dominant coronary circulation. Pulmonary artery pressures were measured  30/10 with a  pulmonary capillary light pressure of 10, but a V wave of  17. Cardiac output at rest was 3.6 liters per minute corresponding to a  cardiac index of 1.9 with thermodilution .   IMPRESSION:  Mitral valve prolapse with severe (4+) mitral  regurgitation, stable symptoms of exertional shortness of breath, and  three-vessel coronary artery disease with preserved left ventricular  function.  I believe that [name] would best be treated with elective  coronary artery bypass grafting and mitral valve repair.   PLAN:  I have discussed options at length with [name] and his wife  here in the office today. Alternative treatments have been discussed.  Based upon on the appearance of transesophageal echocardiogram performed  last fall, I suspect there is a high likelihood that [name] valve  should be reparable with an excellent result.  I do not favor minimally  invasive approach due to the presence of atheroma in the descending  thoracic aorta and the need for concomitant coronary artery bypass  grafting.  [name] and his wife understand and accept all potential  associated risks of surgery including, but not limited to the risk of  death, stroke, myocardial infarction, congestive heart failure,  respiratory failure, pneumonia, bleeding requiring blood transfusion,  arrhythmia, infection and recurrent coronary artery disease as well as  late complications related to valve repair.  They understand there is a  small chance that valve replacement would be necessary, and if this were  the case, [name] specifically requests that a bioprosthetic tissue  valve be utilized in an effort to avoid the need for long term  anticoagulation with Coumadin. This seems to be relatively unlikely as I  believe his valve should be reparable.  All their prior questions have  been addressed.  We tentatively plan to proceed with surgery on Tuesday,  February 10.   Salvatore Decent. Cornelius [name] [name], M.D.  Electronically  Signed   CHO/MEDQ  D:  03/28/2007  T:  03/29/2007  Job:  [id number]   cc:   Arturo Morton. Riley Kill, MD, [facility]  Erskine Speed, M.D.

## 2010-07-12 NOTE — Assessment & Plan Note (Signed)
[facility]                            CARDIOLOGY OFFICE NOTE   SURYA, [name] [name]                         MRN:          [id number]  DATE:05/10/2007                            DOB:          1928/11/27    [name] is in for follow-up.  We spoke to him over the weekend. His  shortness of breath seemed to be slowly and gradually improving over  time.  He is still quite concerned about it, but he seems to be getting  gradually and slowly better.   CURRENT MEDICATIONS:  1. Plavix 75 mg daily.  2. Multivitamin one daily.  3. Toprol XL 25 mg daily.  4. Potassium 20 mEq daily.  5. Aspirin 81 mg daily.  6. Zocor 80 mg daily.  7. Lasix 40 mg 1-1/2 tablets daily.   PHYSICAL EXAMINATION:  The blood pressure is 112/70.  The pulse is 88.  On examination his jugular veins are not distended.  The lung fields are actually quite clear on exam.  The cardiac rhythm is regular.  There is a very soft minimal murmur at  the apex, but prominent mitral regurgitation is not noted.  The abdomen is soft.  There is not significant extremity edema.   [name] [name] and [name] [name] and I reviewed his chest x-ray in great detail  today.  No definite interstitial edema is noted although there is  blunting of both areas compatible with small effusion.   Echocardiogram done recently April 25, 2007 reveals some abnormal  septal motion with mild dilatation of the LV with EF in the 50-55%  range.  There is moderate calcification aortic valve but without  significant abnormality.  There is mild aortic root dilatation.  The  left atrium was minimally dilated. The pulmonary veins were grossly  normal and there was very small pleural and pericardial effusions noted.   I have tried to reassure [name] [name]. He will continue to try to exercise  on a regular basis.  If he is not reasonably improved in a fairly short  order, we may elect to go ahead and get a CT scan, but there is very  little evidence to suggest pulmonary embolus at the present time.  His  recent laboratory studies have included a rising hemoglobin of 11.1, and  his BUN and creatinine have been 31 and 1.4.  As a result, we will  continue to just follow him closely with the hope that he  will improve.  Of interest, his eosinophil count is somewhat elevated,  and I think we will go ahead and have him repeat his lab work.     Arturo Morton. Riley Kill, MD, [facility]  Electronically Signed    TDS/MedQ  DD: 05/14/2007  DT: 05/14/2007  Job #: [id number]

## 2010-07-12 NOTE — Assessment & Plan Note (Signed)
OFFICE VISIT   WILSON, DUSENBERY  DOB:  04-19-1928                                        November 18, 2007  CHART #:  [id number]   HISTORY OF PRESENT ILLNESS:  The patient returns for routine followup  now more than 6 months status post mitral valve repair and coronary  artery bypass grafting x2.  He was last seen here in our office on May 20, 2007.  Since then the patient has done well.  He was seen recently  in followup by Dr. Riley Kill and a 2D echocardiogram was performed.  This  confirmed the presence of essentially normal left ventricular size and  function with ejection fraction estimated 55%.  The mitral valve repair  looked good with trivial residual mitral regurgitation and no  significant mitral stenosis.  No other abnormalities were noted.  The  patient reports that overall, he is doing quite well.  His exercise  tolerance is pretty good, although he is eager to push things a bit  further.  He no longer has any residual soreness in his chest.  He  states that his breathing is back to being at least as good as it was  prior to surgery, if not, perhaps somewhat better.  The remainder of his  review of systems is unremarkable.  The remainder of his past medical  history is unchanged.   CURRENT MEDICATIONS:  Plavix, aspirin, Toprol-XL, Zocor, Lasix,  potassium, and multivitamin.   PHYSICAL EXAMINATION:  GENERAL:  Notable for well-appearing male.  VITAL SIGNS:  Blood pressure 115/70, pulse 56 and regular, and oxygen  saturation 98% on room air.  CHEST:  A median sternotomy scar that has healed completely.  The  sternum is stable on palpation and nontender.  Auscultation of the chest  demonstrates clear breath sounds, which are symmetrical bilaterally.  No  wheezes or rhonchi are noted.  CARDIOVASCULAR:  Regular rate and rhythm.  I do not appreciate a murmur  on exam.  ABDOMEN:  Soft and nontender.  EXTREMITIES:  Warm and well perfused.  There is  no lower extremity  edema.  The remainder of his physical exam is unremarkable.   DIAGNOSTIC TEST:  Report from recent 2D echocardiogram performed at the  [facility] is noted.  Images from this exam are reviewed.  The mitral valve repair looks good.   PLAN:  I have think the patient continues to increase his activity as  tolerated without any specific limitations with respect to his recent  surgery.  We will leave any further long-term adjustment to his  medications to Dr. Riley Kill and colleagues.  In the future, the patient  will call and return to see [place] as needed.  All of his questions have  been addressed.   Salvatore Decent. Cornelius Moras, M.D.  Electronically Signed   CHO/MEDQ  D:  11/18/2007  T:  11/18/2007  Job:  [id number]   cc:   Arturo Morton. Riley Kill, MD, [facility]  Erskine Speed, M.D.

## 2010-07-12 NOTE — Letter (Signed)
June 18, 2007    Erskine Speed, M.D.  [phone] [address], [address]  [place], [place] [address]   RE:  [name], [name]  MRN:  [id number]  /  DOB:  March 09, 1928   Dear Rosanne Ashing:   Your nice patient, [name] [name], was in the office today in a follow-up  visit.  He is slowly and gradually improving.  His shortness of breath  has improved rather substantially.  He denies any ongoing chest pain.  He still gets perhaps a slight degree of shortness of breath at night.   PHYSICAL EXAMINATION:  VITAL SIGNS:  On examination today, the weight is  154 pounds, blood pressure 118/74, the pulse is 79.  CHEST:  The mediastinotomy is well-healed.  HEART:  There is absolutely no murmur.  LUNGS:  The lung fields are clear all the way down to the angles, with  no dullness to percussion.  EXTREMITIES:  The extremities revealed no significant edema.   We have taken the opportunity of getting a CBC and basic metabolic  profile.  I have asked him to decrease his Lasix to 40 mg a day and also  to reduce his potassium to 10 mEq daily.  Over the next few weeks, we  will try to finish his potassium and his Lasix.  We have also gotten a  CBC in part, because he had borderline elevation of his eosinophils.  If  this persists, I will have  him see you in followup to evaluate this further.  He is on no  medications that would likely precipitate this.  We will have him to cut  down his Lasix as I mentioned to 40 mg daily, with the hope of getting  him off this completely.  I think he has had a very nice result from his  mitral valve surgery.  My hope is that he will continue to improve.  Best regards.    Sincerely,      Arturo Morton. Riley Kill, MD, [facility]  Electronically Signed    TDS/MedQ  DD: 06/18/2007  DT: 06/18/2007  Job #: [id number]

## 2010-07-12 NOTE — Assessment & Plan Note (Signed)
[facility]                            CARDIOLOGY OFFICE NOTE   KAYN, HAYMORE                         MRN:          [id number]  DATE:01/17/2007                            DOB:          14-Dec-1928    Mr. Leoni comes in for followup.  I recently had him undergo a  transesophageal echocardiography.  The study was performed by Dr.  Diona Browner.  Overall, left ventricular function was well preserved.  The  aortic valve leaflets were intact.  There was noted atheroma to the  ascending aorta.  There was a thickened mitral valve with moderate  prolapse of the posterior leaflet and evidence of partial sub-leaflet  tear with small freely mobile portion of the subvalvular apparatus.  There was moderate to moderately severe mitral regurgitation.  The left  atrium was mildly dilated.  No left atrial appendage thrombus was  identified.   The patient has done really quite well.  In careful questioning, he says  perhaps he gets some shortness of breath at maximum exercise.   CURRENT MEDICATIONS:  1. Plavix 75 mg daily.  2. Aspirin 325 mg daily.  3. Lisinopril 5 mg daily.  4. Zocor 80 mg daily.  5. Multivitamin daily.   PHYSICAL:  The blood pressure is 150/76, pulse 60.  The lung fields are clear.  There is a fairly prominent holosystolic murmur compatible with mitral  valve regurgitation with extension into the left axillary area.  The extremities reveal no edema.   Mr. Molchan and I have talked about the various options.  The patient is  status post multivessel stenting in [place].  He has moderate  severe to severe mitral regurgitation, but somewhat advanced age.  He  will clearly require catheterization before consideration of surgery,  and if he considers surgery, he should has strongly considered going to  [facility] for treatment by Dr. Lisabeth Register.  The  patient will set up a time.  We will consider going ahead with the  catheterization to identify his coronary anatomy, and also to get  hemodynamics before any consideration is given to this.     Arturo Morton. Riley Kill, MD, [facility]  Electronically Signed    TDS/MedQ  DD: 02/01/2007  DT: 02/01/2007  Job #: [id number]   cc:   Erskine Speed, M.D.

## 2010-07-12 NOTE — Discharge Summary (Signed)
NAME:  KENZIE, FLAKES NO.:  [id number]   MEDICAL RECORD NO.:  [id number]          PATIENT TYPE:  INP   LOCATION:  2018                         FACILITY:  [facility]   PHYSICIAN:  Salvatore Decent. Cornelius Moras, M.D. DATE OF BIRTH:  [birth year]   DATE OF ADMISSION:  04/09/2007  DATE OF DISCHARGE:  04/13/2007                               DISCHARGE SUMMARY   FINAL DIAGNOSES:  1. Mitral valve prolapse with severe mitral regurgitation.  2. Three-vessel coronary artery disease.   IN-HOSPITAL DIAGNOSES:  1. Volume overload postoperatively.  2. Acute blood loss anemia postoperatively.  3. Acute renal insufficiency postoperatively.   SECONDARY DIAGNOSES:  1. History of coronary artery disease, status post acute myocardial      infarction in July 2006 complicated by ventricular fibrillation      arrest.  2. Hypertension.  3. Hyperlipidemia.  4. History of kidney stones.  5. Status post superficial sclerotherapy for bilateral lower extremity      varicose veins.   IN-HOSPITAL OPERATIONS AND PROCEDURES:  1. Mitral valve repair with quadrangular resection posterior leaflet,      with 28 mm electrophysiology ring annuloplasty.  2. Coronary artery bypass grafting x2, using a left internal mammary      artery to circumflex marginal branch, saphenous vein graft to      posterior descending coronary artery.  3. Endoscopic saphenous vein harvest from bilateral lower legs.   HISTORY AND PHYSICAL AND HOSPITAL COURSE:  The patient is a 77 year old  gentleman with known history of mitral valve prolapse and mitral  regurgitation.  This was first identified several years ago, and he was  noted to have a heart murmur on routine physical exam.  The patient also  has history of coronary artery disease dating back to 2006, when he  suffered an acute myocardial infarction complicated by ventricular  fibrillation over cardiac arrest.  He was treated with percutaneous  coronary intervention and  stenting of the left circumflex coronary  artery and a posterior descending coronary artery.  Since then he has  done well.  He has been followed regularly by Dr. Riley Kill.   Follow-up echocardiograms have identified progression of severity of the  mitral regurgitation, to the point it is verified 4+ mitral  regurgitation.  Left ventricular function remains preserved.  The  patient has stable symptoms of exertional shortness of breath, that  remained fairly mild.  Cardiac catheterization done revealed progression  of coronary artery disease and preserved left ventricular function.   Following this, Dr. Cornelius Moras was consulted.  Dr. Cornelius Moras evaluated and  discussed with the patient about undergoing coronary artery bypass  grafting as well as mitral valve repair.  He discussed the risks and  benefits of this procedure with the patient.  The patient acknowledged  understanding and agreed to proceed.  The surgery was scheduled for  April 09, 2007.  For details of the patient's past medical history  and physical exam, please see dictated H&P.   The patient was taken to the operating room on April 09, 2007, where  he underwent mitral valve repair with  quadrangular resection of  posterior leaflet, with 28 mm electrophysiology ring annuloplasty.  He  also had coronary bypass grafting x2 done, using a left internal mammary  artery to a circumflex marginal branch, a saphenous vein graft to a  posterior descending coronary artery.  Endoscopic saphenous vein  harvesting from bilateral lower legs was done.   The patient tolerated this procedure well and was transferred to the  intensive care unit in stable condition.   Postoperatively the patient was noted to be hemodynamically stable.  He  was extubated the evening of surgery.  Post extubation the patient was  placed on nasal cannula.  Saturations were greater than 90%.  He was  noted to be alert and oriented x4.  Neurologically intact post   extubation.   A follow-up chest x-ray on postop day #1 was stable.  He had minimal  drainage from chest tubes, and the chest tube was discontinued in normal  fashion.  Repeat chest x-ray on postop day #2 remained stable.  The  patient was able to be weaned off oxygen, with saturations greater than  90% on room air.  He was continued to use his incentive spirometer.  Postoperatively on day #1 the patient developed acute blood loss anemia.  Hemoglobin and hematocrit dropped to 7.4 and 21.9%.  He received 2 units  of packed red blood cells at this point.  The patient's platelet count  was also noted to be low at this lab work, down 272.  Aspirin and Plavix  were placed on hold.  Repeat CBC in the a.m. showed improved in his  hemoglobin and hematocrit to 9.9 and 28.9 and  also had increased.  His  aspirin and Plavix were restarted.  Repeat CBC done on postop day #3  showed hemoglobin and hematocrit remaining stable at 9.8 and 28.3%.  Platelet count was also increased up further to 89.  No further  transfusions were required.   Postoperatively, the patient required a pacing AAI.  Underlying rhythm  was noted to be brady, with heart rate in the 50s.  Blood pressure was  stable.  He was weaned from all drips on postoperative day #1.  The  patient remained A-pacing on postoperative day #2 at 80.  Blood pressure  was stable and he was started on low-dose beta blocker, as well as  restarted on his ACE inhibitor.  By postoperative day #3 the patient's  underlying rhythm was in the 60s at normal sinus.  External pacer was  discontinued.   The patient remained on his beta blocker as well as his ACE inhibitor,  and heart rate and blood pressure remained stable.  External pacing  wires were discontinued in normal fashion.  The patient tolerated this  well.   Postoperatively the patient had some volume overload.  He was started on  diuretics.  Daily weights obtained.  The patient's volume overload  was  improving and his weight was back near baseline prior to discharge home.  He did receive several doses of IV Lasix, and his creatinine had  increased to 1.56 by postop day #3.  He was started on p.o. Lasix postop  day #3, and will repeat a BMP in the a.m. for evaluation of creatinine  level.  Postoperatively the patient was ambulating well with cardiac  rehabilitation.  He was tolerating diet well.  No nausea or vomiting  noted.   On Postop day #3 the patient's vital signs were stable.  He was  afebrile.  He  was sating greater than 90% on room air.   LABS:  White count 6.9, hemoglobin 9.8, hematocrit 28.3, platelet count  89.  Sodium 138, potassium 4.1, chloride 102, bicarb 30, BUN 17,  creatinine 1.56, glucose 116.   The patient was noted to be in normal sinus rhythm.  Heart rate in the  60s.  Pulmonary status was stable.  Incisions were all clean, dry and  intact and healing well.  The patient is felt to be stable and ready for  discharge home the next 1-2 days.   FOLLOW-UP APPOINTMENTS:  A follow-up appointment has been arranged with  Dr. Cornelius Moras for May 20, 2007 at 1:45 p.m..  The patient will need to  obtain a PMI chest x-ray 30 minutes prior to this appointment.  The  patient will need to follow up with Dr. Riley Kill in 2 weeks.  He will  need to contact Dr. Rosalyn Charters office to make these arrangements.  Prior  to the appointment with Dr. Cornelius Moras, the patient will need to obtain a PMI  chest x-ray 30 minutes beforehand.   ACTIVITY:  The patient is instructed no driving until released to do so,  and no lifting over 10 pounds.  He is told to ambulate 3-4 times per day  and progress as tolerated; and to continue his breathing exercises.   INCISIONAL CARE:  The patient is told to shower, washing his incisions  using soap and water.  He is to contact the office if he develops any  drainage or opening from any of his incision sites.   DIET:  The patient educated on diet to be  low-fat, low-salt.   DISCHARGE MEDICATIONS:  1. Aspirin 81 mg daily.  2. Toprol XL 25 mg daily.  3. Lisinopril 5 mg daily.  4. Zocor 80 mg at night.  5. Lasix 40 mg daily times 7 days.  6. Potassium chloride 20 mEq daily times 7 days.  7. Plavix 75 mg daily.  8. Multivitamin daily.  9. Oxycodone 5 mg 1-2 tablets q.4-6 h. p.r.n..      Theda Belfast, PA      Salvatore Decent. Cornelius Moras, M.D.  Electronically Signed    KMD/MEDQ  D:  04/12/2007  T:  04/13/2007  Job:  [id number]   cc:   Arturo Morton. Riley Kill, MD, [facility]

## 2010-07-12 NOTE — Assessment & Plan Note (Signed)
OFFICE VISIT   [name], [name] [name]  DOB:  1928-12-02                                        May 20, 2007  CHART #:  [id number]   HISTORY OF PRESENT ILLNESS:  This is a 77 year old male, a patient of  Dr. Riley [name], who is status post mitral valve repair (28-mm annuloplasty  ring)  and 2-vessel CABG (LIMA to LAD and SVG to PDA) on 04/09/2007 with  Dr. [name].  Previously he was seen postoperatively and was treated for  bronchitis.  He was also given Lasix and potassium supplement for volume  overload.  In the interim, he also had complaints of some dizziness.  His  Lisinopril has been stopped by Dr. Riley [name] since then and currently on  today's office visit, the patient states that he is no longer dizzy and  he had less shortness of breath.  Most of his shortness of breath occurs  later in the evening but it is less so than previously.  The patient is  currently on Lasix 60 mg p.o. daily.  It should be noted that the patient had an echocardiogram performed on  04/25/2007, which showed that the EF was preserved at 50-55%.  The left  atrium was mildly dilated.  Small pericardial effusion.  Left pleural  effusion.  No mitral stenosis and very trivial mitral valve regurg.   PHYSICAL EXAMINATION:  Vital signs:  As follows, his pulse rate is 86,  respirations 18, O2 sat 97% on room air, BP is 110/74.  Cardiovascular:  Regular rate and rhythm.  S1 S2 without any murmurs, gallops, or rubs.  Pulmonary:  Lungs are clear to auscultation bilaterally.  No rales,  wheezes, or rhonchi.  Extremities:  No cyanosis, clubbing, or edema.  Wounds:  Sternum is solid.  No crepitus noted.  Wound is clean and dry.  Leg incision is clean and dry.   Chest x-ray done today shows a small left pleural effusion.   IMPRESSION AND PLAN:  The patient is going to continue taking his Lasix  until Dr. Riley [name] stops it.  It should be noted that the patient has  stopped his Lisinopril.  This was  discontinued because of hypotension.  The patient was instructed he may drive.  He was also instructed he may  not return to work until at least 3 months postoperatively.  The patient  inquired about resuming golf and he was instructed he may chip and putt  only at this time.  Dr. [name] is going to see the patient in 6 months  followup.  He is to call for any questions or concerns in the meantime  and in addition, he will see Dr. Riley [name] on June 18, 2007.   [name] [name] [name] dictator   /MEDQ  D:  05/20/2007  T:  05/20/2007  Job:  [id number]   cc:   [name] [name] [name]. [name] [name] Kill, MD, [facility]

## 2010-07-12 NOTE — Assessment & Plan Note (Signed)
[facility]                            CARDIOLOGY OFFICE NOTE   [name] [name], [name] [name]                         MRN:          [id number]  DATE:05/01/2007                            DOB:          April 18, 1928    [name] [name] is in for a follow-up visit.  To basically summarize, he was  seen recently by Dr. Excell Seltzer in the office in my absence.  He had been  restarted on diuretics as well as potassium supplementation.  [name] [name]  has been moderately short of breath and has had some difficulty lying  down has been sitting up most of the time.  However, follow-up studies  have not been highly suggestive of significant volume overload.  He has  remained on diuretics, and has really kind of improved in the last day  or two.  A follow-up echocardiogram was done on April 25, 2007.  Overall left ventricular function was thought to be normal.  There was  mild concentric hypertrophy, and there were thought to be some mild  abnormal left ventricular relaxation abnormalities.  The aortic valve  was intact but moderately calcified.  There was mild ascending aortic  dilatation.  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  There was no evidence for significant mitral  stenosis, and no significant mitral valvular regurgitation.  The LA was  mildly dilated, and RV studies were felt to be unremarkable.  There was  a small posterior pericardial effusion and left pleural effusion as  well.  He was seen by the surgeons, and also placed on diuretics and  potassium.  He says the past 48 hours he has actually been somewhat  better.   Today on examination, the blood pressure is 110/70, and the pulse is 95.  The lung fields actually are quite clear all the way down.  There may be  very decreased breath sounds at the bases.  His median sternotomy is  healing reasonably well.  There is a fairly prominent apical impulse.  There is a soft systolic ejection  murmur.  I cannot appreciate an apical  mitral regurgitation murmur.  The abdomen is soft, and the extremities  reveal basically no significant edema.   The patient currently is stable on a medical regimen.  He is back on his  Plavix, given his underlying coronary disease.  We should go ahead, and  check a CBC as well as basic metabolic profile.  I will see him back in  followup in approximately 1 week's time.  At that time, we will repeat  his chest x-ray to make sure we are on the mend.  My hope is that he  will continue to slowly improve.  It does appear that he has gotten a  good operation.     Arturo Morton. Riley Kill, MD, [facility]  Electronically Signed    TDS/MedQ  DD: 05/02/2007  DT: 05/02/2007  Job #: [id number]

## 2010-07-12 NOTE — Assessment & Plan Note (Signed)
OFFICE VISIT   [name] [name], [name] [name]  DOB:  March 24, 1928                                        April 22, 2007  CHART #:  [id number]   REASON FOR FOLLOW-UP:  Shortness of breath status post treatment with  diuretics and antibiotics.   The patient is a 77 year old gentleman who underwent coronary artery  bypass grafting x2 and mitral valve repair on February 10.  He did not  have any significant postoperative complications.  He was discharged  home.  He was seen last week by Salvatore Decent. Cornelius Moras, M.D. for complaints of  shortness of breath.  He had abnormal sputum.  He was started on Avelox  for bronchitis and also treated with Tussionex and also given a  prescription for Lasix and potassium for volume overload.  He now  returns for one-week follow-up.  He says he has noted improvement,  although, not resolution of his shortness of breath.  His main complaint  today is dizziness when he first stands up or gets up to walk.  It is  transient and then improves and he is able to move along after that.  He  finished his Lasix yesterday.   PHYSICAL EXAMINATION:  GENERAL:  The patient is a 77 year old gentleman  in no acute distress.  VITAL SIGNS:  Blood pressure 94/65, pulse 89, and respirations 18.  Oxygen saturation is 99% on room air.  LUNGS:  Diminished breath sounds at both bases.  HEART:  Regular rate and rhythm, questionable soft rub.  His sternal  incision is clean, dry, and intact.  Sternum is stable.  Leg incision is  healing well.  He does have some mild edema at the left lower extremity  at the ankle.   Chest x-ray shows small bilateral effusions with some resolution of his  right perihilar atelectasis.   IMPRESSION:  The patient seems to be making progress at this point in  time.  His cough has improved.  He has completed a one-week course of  Avelox and I do not see any reason to continue antibiotics at this point  in time.  He does have some continued  shortness of breath and hopefully  that will improve over time.  He complains of dizziness and his symptoms  are very orthostatic in nature.  He just finished his Lasix and I do not  want to continue that.  I asked him to hold his lisinopril until he sees  Dr. Riley Kill next week and then he can decide based on his blood pressure  and symptoms whether or not to restart that medication.  He will  continue to take his Toprol.   Salvatore Decent Dorris Fetch, M.D.  Electronically Signed   SCH/MEDQ  D:  04/22/2007  T:  04/22/2007  Job:  [id number]

## 2010-07-12 NOTE — H&P (Signed)
NAME:  [name] [name], TSCHANTZ NO.:  [id number]   MEDICAL RECORD NO.:  [id number]          PATIENT TYPE:  OIB   LOCATION:  1961                         FACILITY:  [facility]   PHYSICIAN:  Arturo Morton. Riley Kill, MD, FACCDATE OF BIRTH:  1928-04-17   DATE OF ADMISSION:  03/06/2007  DATE OF DISCHARGE:                              HISTORY & PHYSICAL   CHIEF COMPLAINT:  Exertional fatigue.   HISTORY OF PRESENT ILLNESS:  [name] [name] is a delightful 77 year old  gentleman who underwent prior stenting of both the diagonal and  circumflex coronary arteries at [place] in 2006. He  was exercising at a [employer] and developed chest pain and subsequently went in  for emergent treatment.  He was noted at that time to have mitral  regurgitation, and serial echocardiograms have been performed. Most  recently he had a TEE study.  This revealed a normal LV function with EF  of 60-65%, atheroma of the descending aorta, and some leaflet tear of  the mitral valve with moderately severe to severe mitral regurgitation.  The transthoracic felt that there was a flail, and this was confirmed by  the TEE.  It was felt that the MR was significant and possibly some  change from 2007.  The LAD was dilated.  As a result of this, we have  had extensive discussions with the patient who noticed perhaps some  exertional fatigue.  As a result, mitral valve repair has been  considered.  Cardiac catheterization is being done to further assess the  situation.   PAST MEDICAL HISTORY:  Remarkable for renal calculi and dyslipidemia.  He had stents in 2006 at which time a 2.75 x 32 TAXUS was placed and a  2.5 x 8 TAXUS placed.  This was done on the diagonal and circumflex   FAMILY HISTORY:  Father died at 5 and his mother at 72.  He has a  brother that has a stent.   SOCIAL HISTORY:  The patient is a nonsmoker.  He has been married and  has two daughters.  He is an Art gallery manager.   REVIEW OF SYSTEMS:   Noncontributory.   MEDICATIONS:  1. Plavix 75 mg daily.  2. Aspirin 325 mg daily.  3. Lisinopril 5 mg daily.  4. Zocor 80 mg daily.  5. Multivitamin daily.   PHYSICAL EXAMINATION:  GENERAL:  He is alert and oriented, in no  distress.  VITAL SIGNS:  Blood pressure 150/76, pulse 60.  LUNGS:  Lung fields clear.  CARDIOVASCULAR:  There is a loud apical systolic murmur compatible with  mitral regurgitation.  ABDOMEN:  Soft.  EXTREMITIES:  Pulses are intact.  NEUROLOGIC:  No neurologic findings.   EKG reveals sinus bradycardia with right bundle branch block.   Chest x-ray reveals normal heart size.  There was DJD in the lower  thoracic spine noted.   White count is 3500. BUN 30, creatinine 1.2. Hematocrit 36   IMPRESSION:  1. Coronary artery disease with prior stenting of the diagonal and      circumflex coronary arteries.  2. 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   PLAN:  The patient will undergo right and left heart catheterization.  The risks and options have been discussed with the patient in detail  when we met with both the patient and his wife.  He is agreeable to  proceed.      Arturo Morton. Riley Kill, MD, [facility]  Electronically Signed     TDS/MEDQ  D:  03/06/2007  T:  03/06/2007  Job:  [id number]   cc:   Arturo Morton. Riley Kill, MD, [facility]  Erskine Speed, M.D.  CV Laboratory

## 2010-07-12 NOTE — Assessment & Plan Note (Signed)
[facility]                            CARDIOLOGY OFFICE NOTE   [name] [name], [name] [name]                         MRN:          [id number]  DATE:10/30/2007                            DOB:          [birth year]    [name] [name] is in for followup.  In general, really he is doing well.  He is  slowly and surely getting better.  He denies any ongoing chest pain.  He  feels well overall.  He still thinks he is little short of breath with  exertion.  He underwent 2-D echocardiography.  This suggested that the  mitral valve repair with annuloplasty ring was without significant  abnormalities.  The mitral valve area was thought to be between 1.4 and  1.7 cm with mild left atrial dilatation, right ventricle dilatation but  with normal systolic function, PA pressure was estimated at 33, EF was  55% with overall well-preserved LV function overall.  In general, he is  doing well.  There was no pericardial effusion noted.   CURRENT MEDICATIONS:  1. Plavix 75 mg daily.  2. Multivitamins daily.  3. Toprol XL 25 mg daily.  4. Aspirin 81 mg daily.  5. Zocor 80 mg daily.  6. Potassium 10 mEq daily.  7. Lasix 20 mEq daily.   PHYSICAL EXAMINATION:  He is alert and oriented in no distress.  The  blood pressure is 110/64, the pulse is 60.  There is a very soft  systolic ejection murmur compatible with aortic sclerosis.  No diastolic  murmurs appreciated.  There is no apical systolic murmur noted.   Electrocardiogram demonstrates normal sinus rhythm with right bundle-  branch block.   IMPRESSION:  1. Status post mitral valve repair.  2. Coronary artery disease status post multivessel stenting.  3. Hypercholesterolemia.  4. Advanced age.   PLAN:  1. Continue current medical regimen.  2. Follow up in clinic in 4-6 months.     Arturo Morton. Riley Kill, MD, [facility]  Electronically Signed    TDS/MedQ  DD: 10/30/2007  DT: 10/31/2007  Job #: [id number]

## 2010-07-12 NOTE — Op Note (Signed)
NAME:  [name] [name], [name] [name] NO.:  [id number]   MEDICAL RECORD NO.:  [id number]          PATIENT TYPE:  INP   LOCATION:  2308                         FACILITY:  [facility]   PHYSICIAN:  Salvatore Decent. Cornelius Moras, M.D. DATE OF BIRTH:  07-23-1928   DATE OF PROCEDURE:  04/09/2007  DATE OF DISCHARGE:                               OPERATIVE REPORT   PREOPERATIVE DIAGNOSIS:  Mitral valve prolapse with severe mitral  regurgitation and three-vessel coronary artery disease.   POSTOPERATIVE DIAGNOSIS:  Mitral valve prolapse with severe mitral  regurgitation and three-vessel coronary artery disease.   PROCEDURE:  Median sternotomy for mitral valve repair (quadrangular  resection of the posterior leaflet with 28 mm Edwards Physio ring  annuloplasty) and coronary artery bypass grafting x2 (left internal  mammary artery to circumflex marginal branch, saphenous vein graft to  posterior descending coronary artery, endoscopic saphenous vein harvest  from bilateral lower legs).   SURGEON:  Dr. Purcell Nails.   ASSISTANT:  Ms. Zadie Rhine.   ANESTHESIA:  General.   BRIEF CLINICAL NOTE:  The patient is a 77 year old gentleman with known  history of mitral valve prolapse and mitral regurgitation.  This was  first identified several years ago when he was noted to have a heart  murmur on routine physical exam.  The patient also has history of  coronary artery disease dating back to 2006 when he suffered an acute  myocardial infarction complicated by V-fib cardiac arrest.  He was  treated with percutaneous coronary intervention and stenting of the left  circumflex coronary artery and the posterior descending coronary artery.  Since then he has done well and he has been followed regularly by Dr.  Bonnee Quin.  Follow-up echocardiograms have identified progression of  the severity of mitral regurgitation to the point of severe (4+) mitral  regurgitation.  Left ventricular function remains  preserved.  The  patient has stable symptoms of exertional shortness of breath that  remained fairly mild.  Cardiac catheterization revealed progression of  coronary artery disease and preserved left ventricular function.  A full  consultation has been dictated previously.  The patient and his wife  have been counseled at length regarding the indications, risks, and  potential benefits of surgery.  Alternative treatment strategies have  been discussed.  They desire to proceed as described.   OPERATIVE FINDINGS:  1. Normal left ventricular systolic function.  2. 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  3. Good-quality left internal mammary artery conduit for grafting.  4. Poor quality saphenous vein and both lower extremities related to      previous sclerotherapy.  5. Good-quality target vessels for grafting.  6. No residual mitral regurgitation after successful mitral valve      repair on follow-up transesophageal echocardiogram.   OPERATIVE NOTE IN DETAIL:  The patient is brought to the operating room  on the above-mentioned date and central monitoring was established  by  the anesthesia service under the care and direction of Dr. Judie Petit.  Specifically, a Swan-Ganz catheter is placed through the right  internal jugular approach.  A radial arterial line is placed.  Intravenous antibiotics were administered.  Following induction with  general endotracheal anesthesia, Foley catheter is placed.  The  patient's chest, abdomen, both groins, and both lower extremities were  prepared, draped in sterile manner.  Baseline transesophageal  echocardiogram was performed by Dr. Randa Evens.  This confirms findings  similar to the preoperative transesophageal echocardiogram with evidence  of prolapse involving a portion of the  posterior leaflet of mitral valve  with severe mitral regurgitation.  The aortic valve appears normal with  mild sclerosis in each of the commissures.  There is normal left  ventricular function.   A median sternotomy incision is performed and left internal mammary  artery is dissected from the chest wall and prepared for bypass  grafting.  The left internal mammary artery is good-quality conduit.  Simultaneously saphenous vein is obtained from both of the patient's  lower legs.  Initially small incisions were made just above the knee on  both thighs.  The greater saphenous vein was located on both lower  extremities, with further endoscopic exploration, both greater saphenous  veins appeared to stop acutely in scar tissue a short distance above the  knee.  Therefore the saphenous vein was harvested from below the knee  using endoscopic vein harvest technique on both lower legs.  After the  saphenous vein is been removed, portions of the vein are unusable due to  sclerosis as well as a few areas of chronic scarring and thrombosis.  Ultimately one segment of saphenous vein is identified which is felt to  be satisfactory to be utilized for conduit for grafting.  The remainder  of the saphenous vein was not usable and is discarded.  The patient is  heparinized systemically and the left internal mammary artery is  transected distally.  The left internal mammary artery is noted to have  excellent flow.   The pericardium is opened.  The ascending aorta is mildly dilated but  otherwise normal in appearance.  The ascending aorta was cannulated for  cardiopulmonary bypass.  A venous cannula is placed directly in the  superior vena cava and a second venous cannula was placed low in the  right atrium with tip extending down the inferior vena cava.  A  retrograde cardioplegic catheter was placed through the right atrium  into the coronary sinus.  Adequate heparinization is verified.   Cardiopulmonary bypass is begun and the surface of the heart was  inspected.  Distal target vessels were selected for coronary bypass  grafting.  Of note, the diagonal branch off the left anterior descending  coronary artery is quite small and given the lack of suitable conduit,  felt to be not necessary as a target vessel for grafting.  There is no  significant disease in the left anterior descending coronary artery.  A  temperature probe is placed in left ventricular septum and a  cardioplegic catheter is placed in the ascending aorta.   The patient is allowed to cool passively of 28 degrees systemic  temperature.  The aortic crossclamp was applied and cold blood  cardioplegia is delivered initially in antegrade fashion through the  aortic root.  Iced saline slush was applied for topical hypothermia and  supplemental cardioplegia is administered retrograde through the  coronary sinus catheter.  The initial cardioplegic arrest  and myocardial  cooling are felt to be excellent.  Repeat doses of cardioplegia are  administered intermittently throughout the crossclamp portion of the  operation through the aortic root, down the subsequently placed vein  graft, and retrograde through the coronary sinus catheter to maintain  left ventricular septal temperature below 15 degrees centigrade.   The following distal coronary anastomoses were performed:  1. The posterior descending coronary artery is grafted with a      saphenous vein graft in end-to-side fashion.  This vessel measured      1.5 mm in diameter and is a good-quality target vessel at the site      of distal grafting.  There is a stent immediately proximal to the      site of distal grafting in this vessel.  2. The circumflex marginal branch is grafted with left internal      mammary artery in end-to-side fashion.  This vessel measures 2.0 mm      in diameter and is a good-quality target vessel for grafting.   The left atriotomy  incision is performed posteriorly through the  interatrial groove.  The mitral valve was exposed using a self-retaining  retractor.  Exposure is felt to be excellent.  The mitral valve was  carefully analyzed.  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  There is mild thickening of both the anterior and posterior  leaflets.  There is one flail segment of a portion of the middle scallop  (P2) of the posterior leaflet.  This is somewhat eccentrically located  towards the posterior commissure.  At this level there is a single  primary chordae tendineae which was ruptured and flail.  There is mild  prolapse otherwise within the posterior leaflet and the posterior  commissure.  There is no prolapse involving the anterior leaflet and the  remainder of the subvalvular apparatus appears normal.  There is no  significant calcification in the mitral annulus.   Mitral valve repair was accomplished uneventfully.  Initially 2-0  Ethibond horizontal mattress sutures were placed circumferentially  around the entire mitral annulus ultimately to be utilized for  annuloplasty ring placement and to facilitate exposure of this portion  of the procedure.  A small quadrangular resection of the flail segment  of the posterior leaflet (P2) is subsequently performed.  The amount of  leaflet removed is quite small and encompasses only a small portion of  the middle scallop.  The remaining vertical defect in the posterior  leaflet is closed using interrupted 4-0 Ethibond everting simple  sutures.  Compression sutures were not necessary due to the small size  of the quadrangular resection.  Ring annuloplasty is now completed after  sizing the mitral valve to a 28 mm ring.  An Edwards Physio ring (model  number [id number], serial number [id number]) is secured in place uneventfully.  After completion of valve repair, the valve was tested  by instilling  iced saline into the left ventricular chamber.  There is a symmetrical  line of coaptation of the anterior posterior leaflet with broad surface  area of coaptation and no residual leak.  Rewarming was begun.   The left atrial appendage is oversewn from within the left atrium using  a two-layer closure of running 3-0 Prolene suture.  The left atriotomy  incision is closed using a two-layer closure of running 3-0 Prolene  suture.  The single proximal saphenous vein anastomoses is performed  directly to the ascending aorta prior to removal of the aortic  crossclamp.  One final dose of warm retrograde hot shot cardioplegia is  administered.  The patient is placed in Trendelenburg position and the  lungs were ventilated to evacuate any residual air through the aortic  root.  The aortic crossclamp was removed after total crossclamp time of  123 minutes.   The heart was defibrillated and initially junctional rhythm resumes.  The proximal and all distal coronary anastomoses are inspected for  hemostasis and appropriate graft orientation.  Epicardial pacing wires  fixed to the right ventricular free wall and to the right atrial  appendage.  The patient is rewarmed to 37 degrees centigrade  temperature.  The patient is weaned from cardiopulmonary bypass without  difficulty.  The patient's rhythm at separation from bypass is sinus  rhythm with second-degree AV block.  AV sequential pacing is employed.  Total cardiopulmonary bypass time for the operation is 144 minutes.  No  inotropic support is required.  Follow-up transesophageal echocardiogram  performed by Dr. Randa Evens after separation from bypass demonstrates  normal left ventricular function.  There is a well seated annuloplasty  ring in the mitral position.  There is no residual mitral regurgitation.  There is no significant residual air.   The aortic root vent is removed.  The IVC cannula is removed and its  cannulation site  oversewn with Prolene suture.  The SVC cannula is  similarly removed and the aortic cannula is removed uneventfully.  Protamine is administered to reverse anticoagulation.  The mediastinum  was irrigated with saline solution containing vancomycin.  Meticulous  surgical hemostasis ascertained.  The mediastinum and both pleural  spaces were drained with four chest tubes exited through separate stab  incisions inferiorly.  The pericardium and soft tissues anterior to the  aorta are reapproximated loosely.  The sternum was closed with double-  strength sternal wire.  The On-Q continuous pain management system is  utilized to facilitate postoperative pain control.  Two 10-inch  catheters supplied with the On-Q kit are tunneled into the deep  subcutaneous tissues and positioned just lateral to the lateral border  of the sternum on either side.  Each catheter is flushed with 5 mL of  0.5% bupivacaine solution and ultimately connected to continuous  infusion pump.  The soft tissues anterior to the sternum are closed in  multiple layers and the skin is closed with a running subcuticular skin  closure.   The patient tolerated the procedure well and was transported to the  surgical intensive care unit in stable condition.  There are no  intraoperative complications.  All sponge, instrument and needle counts  verified correct at completion of the operation.  No blood products were  administered.      Salvatore Decent. Cornelius Moras, M.D.  Electronically Signed     CHO/MEDQ  D:  04/09/2007  T:  04/10/2007  Job:  [id number]   cc:   Arturo Morton. Riley Kill, MD, [facility]  Erskine Speed, M.D.

## 2010-07-12 NOTE — Letter (Signed)
July 31, 2007    Erskine Speed, M.D.  [address], [address]  [place], [place] [address]   RE:  [name] [name], [name] [name]  MRN:  [id number]  /  DOB:  February 07, 1929   Dear [name],   Your are going to be seeing Su Grand in the near future.  We have been  following his coronary revascularization surgery, and his mitral valve  repair.  As you know, he has done reasonably well.  He has noticed a  mild degree of shortness of breath, but the shortness of breath has not  limited him from walking 3 miles, up hills and a variety of other  things.  He denies any current chest discomfort.  He did undergo an  echocardiogram after his surgery.  This revealed left ventricular  function that was overall normal with mild dilatation of the LV. The  mitral ring was intact and there was no significant evidence of  significant mitral regurgitation.  RV structures at that time were  normal and the inferior vena cava was not dilated.  The right atrial  size was normal as well.  There has been a small left pleural effusion  and he has been on some diuretic therapy.  He notices just mild  shortness of breath.   CURRENT MEDICATIONS:  1. Plavix 75 mg daily.  2. Multivitamin daily.  3. Toprol XL 25 mg daily.  4. Aspirin 81 mg daily.  5. Zocor 80 mg daily.  6. Lasix 40 mg daily.  7. Potassium 10 mEq daily.   PHYSICAL EXAM:  He is alert and oriented, in no distress.  The weight is 157 pounds, the blood pressure 126/70 and the pulse is 62.  The jugular veins are not distended.  The lung fields are entirely clear to auscultation and percussion.  Careful examination over the precordium does not reveal a significant  murmur.  The extremities do not reveal significant edema.   The electrocardiogram demonstrates normal sinus rhythm with incomplete  right bundle and nonspecific T-wave abnormality.   Recent laboratory studies have been performed.  The notable findings are  a BUN of 30 and creatinine of 1.2.  The glucose is 137.   As a function  of that, we have elected to decrease his furosemide.   In addition,  his white cell count was 2800.  His hemoglobin is normal  at 13 and his hematocrit 38.5.  He has borderline monocytes and  eosinophils at 13.8% and 5.3% , both just above the normal range.   In going back through his records, his white cell count has never been  significantly elevated.  At times it has been higher, 3600 in your  office in 2007, 3200 in 2007.   In general, he has been stable.  We will decrease his Lasix to 20 mg.  I  have asked him to discuss with you the lower white cell counts.  Whether  this needs to be evaluated further,  we would certainly leave up to your  discretion.  From a cardiac standpoint, I think he is doing well.  With  the mild shortness of breath, we are keeping a close eye on his overall  situation.  We will have him come in for a 2-D echo in another 2 months,  which will be 6 months after his surgery.  We will also do a chest x-ray  at that time to ensure that things have resolved.  My hope is that he  will continue  to improve.  I did mention that there was a possibility  that his exercise tolerance might not be quite as good as he had prior  to surgery, but I think the echocardiogram will be helpful in helping [place]  assess that.  Nonetheless, my hope is that he will do well.   Thanks so much for allowing me to share in his care.  I will forward to  your office a copy of his records.  Again, thanks for referring him.    Sincerely,      Arturo Morton. Riley Kill, MD, [facility]  Electronically Signed    TDS/MedQ  DD: 07/31/2007  DT: 07/31/2007  Job #: [id number]

## 2010-07-12 NOTE — Assessment & Plan Note (Signed)
OFFICE VISIT   [name], [name]  DOB:  12-28-1928                                        April 15, 2007  CHART #:  [id number]   HISTORY OF PRESENT ILLNESS:  The patient recently underwent mitral valve  repair and coronary artery bypass grafting x2 on April 09, 2007, for  mitral valve prolapse with severe mitral regurgitation and 3-vessel  coronary artery disease.  His initial postoperative recovery was  uneventful.  He was discharged from the hospital 2 days ago.  Since  then, he has developed increased productive cough as well as some  shortness of breath.  He telephoned over the weekend and was instructed  to come to the office today for further evaluation.  He denies any  fevers or chills.  He has been coughing up a small amount of yellowish,  thick sputum over the last 48 hours.  This has been somewhat  intermittent.  He has had some shortness of breath that stems back to  the time of surgery.  His weight has continued to gradually fall over  the last 3 days, and he states that he thinks he is responding nicely to  the oral diuretic.  His appetite is good.  He is not sleeping well at  night yet but, otherwise, he feels well.   PHYSICAL EXAMINATION:  Notable for a well-appearing male with blood  pressure 129/80, pulse 83 and regular.  Oxygen saturation is 91% on room  air.  He is afebrile.  Chest:  Notable for median sternotomy incision  that is healing nicely.  The sternum is stable on palpation.  Auscultation of the chest demonstrates somewhat diminished breath sounds  at the right lung base with a few inspiratory crackles scattered  bilaterally.  No wheezes or rales are noted.  Cardiovascular:  Notable  for regular rate and rhythm.  No murmurs, rubs, or gallops noted.  Abdomen:  Soft and nontender.  Extremities:  Warm and well-perfused.  There is no lower extremity edema.  The saphenous vein harvest incisions  are healing nicely.   DIAGNOSTIC  TESTS:  Chest x-ray obtained, today, at the [facility], is reviewed.  This demonstrates mild bibasilar  atelectasis and pleural effusions, more so on the right than the left.  This may be slightly worse than the last x-ray prior to hospital  discharge, particularly on the right side.  The lung fields are, for the  most part, otherwise clear.  No other abnormalities are noted.   LABORATORY DATA:  The laboratory data obtained today is notable for a  hemoglobin of 10.0, hematocrit 30%, white blood count 6,400, platelet  count 290,000.   IMPRESSION:  Worsening productive cough with bibasilar atelectasis and  small pleural effusions, right greater than left.  Overall, the patient  looks pretty well but he is coughing up some thick, yellowish sputum.  We will treat him, empirically, for possible tracheobronchitis.   PLAN:  I have given the patient a prescription for Avelox 400 mg p.o.  daily to begin today.  He will continue daily Lasix as prescribed at the  time of hospital discharge for treatment of volume excess.  He will  return in 1 week with a repeat chest x-ray to make sure that his  atelectasis and pleural effusions are improving.   [name] Decent.  [name], M.D.  Electronically Signed   CHO/MEDQ  D:  04/15/2007  T:  04/16/2007  Job:  [id number]   cc:   Arturo Morton. Riley Kill, MD, [facility]  Erskine Speed, M.D.

## 2010-07-12 NOTE — Assessment & Plan Note (Signed)
[facility]                            CARDIOLOGY OFFICE NOTE   [name] [name], [name] [name]                         MRN:          [id number]  DATE:05/10/2007                            DOB:          Nov 28, 1928    [name] is in for followup.  Overall, I think he is doing a little bit  better.  In general, he is perhaps not quite as short of breath.  We  continue to examine him closely following his surgery.  His wife is  still not quite sure that he is up to where he was previously.   PHYSICAL EXAMINATION:  VITAL SIGNS:  The blood pressure is 112/70, pulse  is 88.  NECK:  The neck veins are not distended.  CARDIAC:  He has a minimal if any systolic murmur at the apex.   Of note, his recent echocardiogram reveals a minimally dilated left  ventricle.  There is trivial mitral valve regurgitation noted.  He has a  small pericardial effusion and a left pleural effusion, although his  recent x-ray followup was okay.  Recent laboratory studies include 25%  eosinophils, and we will get him back in for a followup on this.  The  white count overall is normal.     Arturo Morton. Riley Kill, MD, [facility]  Electronically Signed    TDS/MedQ  DD: 05/20/2007  DT: 05/20/2007  Job #: [id number]

## 2010-07-12 NOTE — Discharge Summary (Signed)
NAME:  COREE, BRAME NO.:  [id number]   MEDICAL RECORD NO.:  [id number]          PATIENT TYPE:  OBV   LOCATION:  3728                         FACILITY:  [facility]   PHYSICIAN:  Arturo Morton. Riley Kill, MD, FACCDATE OF BIRTH:  07-06-1928   DATE OF ADMISSION:  03/06/2007  DATE OF DISCHARGE:  03/07/2007                               DISCHARGE SUMMARY   PRIMARY CARDIOLOGIST:  Maisie Fus D. Riley Kill, M.D.   PRIMARY CARE PHYSICIAN:  Erskine Speed, M.D.   DISCHARGE DIAGNOSIS:  Right femoral venous bleeding following diagnostic  cardiac catheterization.   SECONDARY DIAGNOSIS:  1. Prior history of coronary artery disease, status post stenting, now      with nonobstructive disease and patent stent on catheterization      this admission.  2. Moderate to severe mitral regurgitation secondary to leaflet tear      of the mitral valve.  3. Hyperlipidemia.  4. History of renal calculi.  5. Mild hypertension.   ALLERGIES:  No known drug allergies.   PROCEDURES:  Right and left heart cardiac catheterization.   HISTORY OF PRESENT ILLNESS:  Mr. Saxton is a 77 year old male with prior  history of moderate to severe mitral regurgitation felt to be secondary  to leaflet tear as well as coronary artery disease status post prior  stenting to the diagonal and circumflex.  He has been followed by Dr.  Riley Kill and recently has been noted to have exertional fatigue and it  was felt the patient would benefit from right and left heart diagnostic  cardiac catheterization to re-evaluate his coronary anatomy as well as  his left and right heart pressures and determine the significance of his  mitral regurgitation.   HOSPITAL COURSE:  Mr. Kirksey underwent diagnostic catheterization in our  outpatient lab on March 06, 2007.  This revealed nonobstructive  coronary artery disease and normal right heart pressures.  Pulmonary  capillary wedge pressure was 10 with an LVEDP of 5.  Please refer to  catheterization dictation for complete details.  It was felt that the  patient suffered from moderate to severe mitral regurgitation.  Initially plans were for discharge from the outpatient laboratory,  however, post catheterization the patient developed recurrent bleeding  following sheath pull requiring additional manual compression along with  injection of Lidocaine with epinephrine.  Hemostasis was achieved and  the patient was observed overnight without complications and stable  hemoglobin and hematocrit.  Mr. Fiveash has been ambulating this morning  and will be discharged home in satisfactory condition with plans for  followup with Dr. Riley Kill on Friday, March 22, 2007 at 10:00 A.M.   DISCHARGE LABORATORY DATA:  Hemoglobin 11.2, hematocrit 32.7, white  blood cell count 4.8, platelet count 201,000.   DISPOSITION:  The patient is being discharged home today.   FOLLOWUP PLANS AND APPOINTMENTS:  Follow up with Dr. Riley Kill on March 22, 2007 at 10:00 A.M.   DISCHARGE MEDICATIONS:  1. Plavix 75 mg daily.  2. Aspirin 81 mg daily.  3. Lisinopril 5 mg daily.  4. Zocor 80 mg daily.  5. Multivitamin  one daily.   OUTSTANDING LAB STUDIES:  None.   Duration discharge encounter:  30 minutes including physician time.      Nicolasa Ducking, ANP      Arturo Morton. Riley Kill, MD, [facility]  Electronically Signed    CB/MEDQ  D:  03/07/2007  T:  03/07/2007  Job:  [id number]   cc:   Erskine Speed, M.D.

## 2010-07-12 NOTE — Letter (Signed)
June 18, 2007    Erskine Speed, MD.  [phone] [address], [address]  [place], [place], [address]   RE:  [name] [name], [name] [name]  MRN:  [id number]  /  DOB:  [birth year]    Sincerely,      [name] Morton. Riley Kill, MD, [facility]    TDS/MedQ  DD: 06/18/2007  DT: 06/18/2007  Job #: [id number]

## 2010-07-12 NOTE — Assessment & Plan Note (Signed)
[facility]                            CARDIOLOGY OFFICE NOTE   [name] [name], [name] [name]                         MRN:          [id number]  DATE:04/25/2007                            DOB:          December 28, 1928    [name] [name] was seen in follow-up at [facility] office on  April 25, 2007.  He was scheduled to follow up with Dr. Riley Kill, but  Dr. Riley Kill had an emergency angioplasty, and I am seeing [name] [name].  He is a 77 year old gentleman with mitral valve  prolapse and severe mitral regurgitation, who underwent mitral valve  repair with a 28-mm annuloplasty ring and two vessel CABG using a LIMA  to OM branch and saphenous vein to PDA branch on February 10 by Dr.  Cornelius Moras.  [name] [name] has had some had an excellent initial recovery from  surgery and was discharged from the hospital on February 14.  Since  discharge home,  he has had some difficulty with lightheadedness, as  well as shortness of breath.  He has had a left pleural effusion, and  his shortness of breath worsened after stopping his Lasix several days  ago.  He was started back on Lasix yesterday, and feels like he has had  slight improvement in his breathing since then.  He describes orthopnea,  but has not had PND.  He has had no edema.  He denies chest pain other  than expected post sternotomy pain.  [name] [name] admits to postural  lightheadedness, but this resolves quickly after a few seconds.  He has  not had any episodes of near syncope.   CURRENT MEDICATIONS:  1. Plavix 75 mg daily.  2. Aspirin 81 mg daily.  3. Zocor 80 mg daily.  4. Multivitamin daily.  5. Toprol XL 25 mg daily.  6. Lasix 40 mg daily.  7. Potassium 20 mEq daily.   EXAMINATION:  GENERAL:  On exam, he is alert and oriented, in no acute  distress.  VITAL SIGNS:  Weight 150, blood pressure 102/70, heart rate 95,  respiratory rate is 16.  HEENT:  Normal.  NECK:  Normal carotid upstrokes without  bruits.  Jugular venous pressure  is normal.  LUNGS:  Lungs are clear bilaterally.  HEART:  Regular rate and rhythm with a soft systolic ejection murmur at  the base.  There are no apical murmurs present.  ABDOMEN:  Soft,  nontender no organomegaly.  EXTREMITIES:  No clubbing, cyanosis or edema.  CHEST:  Shows a well-healing sternotomy scar.   EKG shows sinus rhythm with a incomplete right bundle branch block and  lateral T-wave abnormality.  Echocardiogram done in the office, just  prior to his visit, showed normal left ventricular systolic function  with an LVEF of 50% to 55% with marked increase in LV wall thickness and  abnormal.  The mitral valve repair was intact with myxomatous  proliferation of the valve.  The mitral valve area was calculated at 3.3  cm2.  There is a small pericardial effusion, and a left pleural effusion  was  noted as well.   ASSESSMENT:  [name] [name] is making a slow recovery from surgery, although  he is only a few weeks out.  I have advised them to continue on Lasix  and potassium, until he sees Dr. Riley Kill back in followup next week.  I  agree with continuing to hold his lisinopril, due to his borderline  blood pressure.  He will remain on his other medications without  changes.  He has been monitoring his home blood pressure carefully, and  I asked him to hold his Lasix if he has any systolic blood pressures  less than 90 mmHg.  His chest x-ray shows a resolving left pleural  effusion in his clinical exam that is not suggestive of significant  volume overload.  Etiology of his dyspnea is not completely clear to me,  but it may be related to his pleural effusion or possibly due to  diastolic dysfunction with marked LV hypertrophy by echo.  In any event,  I think he is slowly improving, and he will follow up next week as  outlined above.     Veverly Fells. Excell Seltzer, MD  Electronically Signed    MDC/MedQ  DD: 04/25/2007  DT: 04/26/2007  Job #: [id number]   cc:    Arturo Morton. Riley Kill, MD, [facility]  Salvatore Decent. Cornelius Moras, M.D.

## 2010-07-12 NOTE — Assessment & Plan Note (Signed)
[facility]                            CARDIOLOGY OFFICE NOTE   LASHAN, GLUTH                         MRN:          [id number]  DATE:03/11/2008                            DOB:          1928-07-31    Mr. Marineau is in for followup.  He has undergone mitral valve repair by  Dr. Barry Dienes.  He has done well and subsequently been released from Dr.  Barry Dienes' followup.  The patient has had prior stenting as well.  The  mitral valve repair appears to been successful.  Follow-up  echocardiography has revealed normal left ventricular size and overall  ejection fraction in the range of 55%.  There was mild diastolic  dysfunction.  There was moderate calcification of the aortic valve with  no evidence of significant valvular stenosis.  The valve repair  consistent of angioplasty ring.  There was not significant mitral valve  stenosis.  There was mild right ventricular dilatation.  Pulmonary  artery pressure estimates are in the range of 33.  Overall, the patient  has continued to remain stable.  He does get short of breath when he  goes upstairs as a result, he remains a little bit concerned about these  findings.   MEDICATIONS:  1. Plavix 75 mg daily.  2. Multivitamin daily.  3. Toprol-XL 25 mg daily.  4. Aspirin 81 mg daily.  5. Zocor 80 mg daily.  6. Potassium 10 mEq daily.  7. Lasix 20 mg daily.   EKG reveals sinus bradycardia with incomplete right bundle-branch block.   Last chest x-ray dated March 2009 revealed pleural parenchymal scarring  in the left hemithorax and possibly a tiny left effusion.   IMPRESSION:  1. Status post mitral valve repair and coronary bypass graft surgery.  2. Hypercholesterolemia.  3. Advanced age.   RECOMMENDATIONS:  1. Continued followup with Dr. Nila Nephew.  2. The patient has suggested and we have agreed that he can have some      pulmonary function studies as well as repeat chest x-ray.  3. He will return to  Cardiology clinic in 4 months at which time we      will recommend repeat 2-D echocardiogram.     Arturo Morton. Riley Kill, MD, [facility]  Electronically Signed    TDS/MedQ  DD: 03/11/2008  DT: 03/12/2008  Job #: [id number]   cc:   Erskine Speed, M.D.

## 2010-07-12 NOTE — Assessment & Plan Note (Signed)
[facility]                            CARDIOLOGY OFFICE NOTE   NATASHA, PAULSON                           MRN:          [id number]  DATE:11/29/2006                            DOB:          [birth year]    Trip is in for a follow up visit. In general, he is doing well. He  denies any ongoing chest pain or shortness of breath. He continues to  remain very active, and is working on a regular basis. The patient has  had prior stenting in multiple vessels. He is known to have mitral  regurgitation. Dr. Jens Som and I have reviewed the echocardiograms in  the past in great detail and we elected this spring to defer further  evaluation until current follow up. He denies any exertional shortness  of breath or major cardiac symptoms.   MEDICATIONS:  1. Plavix 75 mg daily.  2. Aspirin 325 mg daily.  3. Lisinopril 5 mg daily.  4. Zocor 80 mg daily.  5. Multivitamin daily.   PHYSICAL EXAMINATION:  GENERAL:  He is alert, oriented, and in no acute  distress.  VITAL SIGNS:  Blood pressure 140/70, pulse 42 and regular.  LUNGS:  Fields are entirely clear.  CARDIAC:  There is an apical systolic murmur that would be graded at 3/6  compatible with mitral regurgitation.   Electrocardiogram today reveals sinus bradycardia with incomplete right  bundle morphology.   2D echocardiogram demonstrates overall well preserved left ventricular  function. There is mild increase in aortic valve thickening. There is  mild prolapse in the interior mitral leaflet and moderate prolapse to  the posterior leaflet. There is probably not much change in the  echocardiogram in terms of the mitral valve findings, and Dr. Myrtis Ser has  suggested that there might be a partial flail. Left atrium was dilated  with an atrial size of 43 millimeters. Trey Paula thought that the MR was  possibly changed from before.   IMPRESSION:  1. Coronary artery disease status post multivessel percutaneous drug-     eluting stent implantation.  2. Moderately severe to severe mitral regurgitation, currently      asymptomatic.   RECOMMENDATIONS:  I have discussed this with the patient. My  recommendation would be that we consider doing a transesophageal  echocardiogram. I have recommended this for evaluation of the severity  of his mitral regurgitation, and even though and despite his advanced  age, I think that this would be worthwhile. I have explained this to him  in detail and we will proceed when it is convenient for the patient. It  is being set up for Dr. Diona Browner to do.     Arturo Morton. Riley Kill, MD, [facility]  Electronically Signed    TDS/MedQ  DD: 12/09/2006  DT: 12/09/2006  Job #: [id number]   cc:   Erskine Speed, M.D.  Jonelle Sidle, MD

## 2010-07-12 NOTE — Assessment & Plan Note (Signed)
[facility]                            CARDIOLOGY OFFICE NOTE   [name] [name], [name] [name]                         MRN:          [id number]  DATE:03/22/2007                            DOB:          28-Oct-1928    [name] [name] is in for follow-up.  He underwent repeat cardiac  catheterization.  To summarize, he had right and left heart  catheterization with some difficulty getting into the femoral vein.  Once we hydrated him, we were subsequently able to access this.  With  this, he did not have significant elevations pulmonary pressures.  His  stents were patent, but he does have some progression of disease.  Specifically, there is a 70% diagonal stenosis in a moderate size  vessel.  The stent in the circumflex is widely patent, but there is  segmental plaquing of 70% leading into this.  Marginal also has a focal  stenosis of about 70%.  The right coronary artery has a 50-60% stenosis  distally and the previously placed stent in the PDA remains widely  patent.   Given the patient's coronary disease, it seems likely that he would need  to have his mitral valve repair in the setting of coronary artery bypass  graft surgery.  We have talked about a variety of possibilities, and  these include having his surgery here or alternative places.  He seems  to be comfortable with the possibility of Dr. Cornelius Moras.  At the present time  we will pursue that path.     [name] [name]. Riley Kill, MD, [facility]  Electronically Signed    TDS/MedQ  DD: 04/17/2007  DT: 04/17/2007  Job #: [id number]

## 2010-07-15 NOTE — Cardiovascular Report (Signed)
NAME:  [name] [name], [name] [name] NO.:  [id number]   MEDICAL RECORD NO.:  [id number]          PATIENT TYPE:  OIB   LOCATION:  1961                         FACILITY:  [facility]   PHYSICIAN:  [name] Morton. Riley Kill, MD, FACCDATE OF BIRTH:  November 15, 1928   DATE OF PROCEDURE:  03/05/2004  DATE OF DISCHARGE:                            CARDIAC CATHETERIZATION   INDICATIONS:  [name] [name] is a delightful 77 year old gentleman well-known  to me.  He previously presented with a myocardial infarction in 2006 and  had drug-eluting stents placed in the circumflex artery and posterior  descending artery.  He has had known mitral regurgitation and a  transesophageal ultrasound study suggested fairly severe MR.  As a  result, we have discussed the possible options and he was brought in for  right and left heart catheterization today.   PROCEDURE:  1. Right and left heart catheterization  2. Selective coronary arteriography.  3. Selective left ventriculography.   DESCRIPTION OF PROCEDURE:  The patient was brought to the  catheterization laboratory and prepped and draped in usual fashion.  We  attempted using a SMART needle to gain access to the right femoral vein.  We were initially unsuccessful.  We then attempted the left femoral  vein.  Multiple attempts using the SMART needle were tried, and without  success despite careful analysis of landmarks.  As a result, we elected  to give the patient IV fluids.  We then performed left heart  catheterization including coronary arteriography and left  ventriculography.  Following this, an additional attempt was made in the  right femoral vein and we were able to gain access after about 500 mL of  fluid.  The femoral vein sheath was then placed.  A thermodilution Swan-  Ganz catheter was then taken to the superior vena cava where a  saturation was obtained.  Serial right heart pressures were then  obtained.  Following wedge pressure, the thermodilution cardiac  outputs  were performed.  We then obtained additional superior vena cava  saturation and RA saturation because of some differential between the  SVC and the PA.  Following this, the Swan-Ganz catheter was subsequently  removed.  All catheters were subsequently removed and hemostasis  achieved by direct manual compression.  There were no significant  complications.  I then reviewed the findings with the patient and then  subsequently with his wife.   HEMODYNAMIC DATA:  1. Right atrial pressure 4.  2. RV 30/6.  3. Pulmonary artery 30/10.  4. Pulmonary capillary wedge mean 10 with a V to 17.  5. LV 112/50, mean 73.  6. LV 110/5.  7. No gradient on pullback across the aortic valve.  8. Superior vena cava saturation 56/58.  9. RA saturation 63%.  10.Pulmonary artery saturation 65%.  11.Thermodilution cardiac output 3.6 L per minute.  12.Thermodilution cardiac index 1.9 L per minute per meters squared.  13.Fick cardiac output 3.2 L per minute.  14.Fick cardiac index 1.7 L per minute per meters squared.   ANGIOGRAPHIC DATA:  1. Ventriculography was done in the RAO projection.  Systolic function  appeared to be preserved with ejection fraction estimate of around      55-60%.  There was moderate mitral regurgitation angiographically      graded at 3+.  2. The left main is free of critical disease.  3. The LAD has some calcification.  The diagonal has a 70% stenosis      and is a moderate-sized vessel.  The LAD just beyond this has mild      luminal irregularity and the distal LAD is a smaller caliber      vessel.  The septal has 70% narrowing.  4. The circumflex has a previously stented vessel.  The stent is      widely patent.  There is segmental plaquing of about 70% leading      into the marginal.  The marginal has a focal stenosis of about 70%      at the takeoff of the AV circumflex.  5. The right coronary artery has about 30% mid narrowing, then a 50-      60% stenosis  distally.  The proximal PDA, which has been previously      stented, is widely patent.   CONCLUSIONS:  1. Preserved overall left ventricular function.  2. Mitral regurgitation 3+ with relatively normal right heart      pressures after intravenous fluids.  3. Continued patency of the circumflex and posterior descending artery      stents.  4. A 70% obtuse marginal, 70% diagonal, and 50-60% right coronary      artery stenoses as noted in the above text.   DISPOSITION:  I plan to have the patient return to the office for a  thorough discussion of potential options.  We will consider whether or  not referral to a surgeon or continued medical therapy with possible  percutaneous intervention is warranted.      [name] Morton. Riley Kill, MD, [facility]  Electronically Signed     TDS/MEDQ  D:  03/06/2007  T:  03/06/2007  Job:  [id number]   cc:   Erskine Speed, M.D.  [facility]

## 2010-07-26 ENCOUNTER — Other Ambulatory Visit: Payer: Self-pay | Admitting: Cardiology

## 2010-07-27 ENCOUNTER — Other Ambulatory Visit: Payer: Self-pay | Admitting: Cardiology

## 2010-09-05 ENCOUNTER — Other Ambulatory Visit: Payer: Self-pay | Admitting: Cardiology

## 2010-09-19 ENCOUNTER — Other Ambulatory Visit: Payer: Self-pay | Admitting: Cardiology

## 2010-09-21 ENCOUNTER — Encounter: Payer: Self-pay | Admitting: Gastroenterology

## 2010-09-21 ENCOUNTER — Ambulatory Visit (INDEPENDENT_AMBULATORY_CARE_PROVIDER_SITE_OTHER): Payer: No Typology Code available for payment source | Admitting: Gastroenterology

## 2010-09-21 DIAGNOSIS — I499 Cardiac arrhythmia, unspecified: Secondary | ICD-10-CM | POA: Insufficient documentation

## 2010-09-21 DIAGNOSIS — Z1211 Encounter for screening for malignant neoplasm of colon: Secondary | ICD-10-CM

## 2010-09-21 NOTE — Progress Notes (Signed)
History of Present Illness: [name] [name] is a pleasant 77 year old white male here for followup colonoscopy. Screening colonoscopy in 2005 was negative except for diverticulosis. He has no GI complaints including change of bowel habits, abdominal pain, melena or hematochezia. He received a notification that he is due for followup colonoscopy. He is on Plavix for because of coronary artery disease and mitral valve prolapse.    Review of Systems: He complains of occasional dyspnea on exertion Pertinent positive and negative review of systems were noted in the above HPI section. All other review of systems were otherwise negative.    Current Medications, Allergies, Past Medical History, Past Surgical History, Family History and Social History were reviewed in [employer] electronic medical record  Vital signs were reviewed in today's medical record. Physical Exam: General: Well developed , well nourished, no acute distress Head: Normocephalic and atraumatic Eyes:  sclerae anicteric, EOMI Ears: Normal auditory acuity Mouth: No deformity or lesions Lungs: Clear throughout to auscultation Heart: Heart beat is irregularly irregular. There is a 2-3/6 early systolic murmur heard throughout the precordium. Abdomen: Soft, non tender and non distended. No masses, hepatosplenomegaly or hernias noted. Normal Bowel sounds Rectal:deferred Musculoskeletal: Symmetrical with no gross deformities  Pulses:  Normal pulses noted Extremities: No clubbing, cyanosis, edema or deformities noted Neurological: Alert oriented x 4, grossly nonfocal Psychological:  Alert and cooperative. Normal mood and affect

## 2010-09-21 NOTE — Assessment & Plan Note (Signed)
I instructed the patient to contact Dr. Chilton Si since he is unaware of any history of arrhythmias.

## 2010-09-21 NOTE — Patient Instructions (Signed)
Call back as needed 

## 2010-09-21 NOTE — Assessment & Plan Note (Addendum)
In the absence of symptoms I don't think recall colonoscopy needs to be done at this time. I will reevaluate him in 3 years which would be 10 years since his last colonoscopy. A decision whether or not to do screening colonoscopy will be determined by his medical status at that time.

## 2010-11-17 LAB — CBC
Hemoglobin: 11.2 — ABNORMAL LOW
MCHC: 34.3
RDW: 13.1

## 2010-11-17 LAB — POCT I-STAT 3, VENOUS BLOOD GAS (G3P V)
Acid-base deficit: 1
Acid-base deficit: 2
Acid-base deficit: 7 — ABNORMAL HIGH
Bicarbonate: 19.9 — ABNORMAL LOW
Bicarbonate: 23.5
O2 Saturation: 45
O2 Saturation: 56
O2 Saturation: 63
O2 Saturation: 65
Operator id: 194801
TCO2: 21
TCO2: 25
TCO2: 26
TCO2: 26
pCO2, Ven: 42.2 — ABNORMAL LOW
pCO2, Ven: 43.7 — ABNORMAL LOW
pH, Ven: 7.339 — ABNORMAL HIGH
pO2, Ven: 32
pO2, Ven: 34

## 2010-11-17 LAB — POCT I-STAT 3, ART BLOOD GAS (G3+)
Acid-base deficit: 1
O2 Saturation: 98
TCO2: 23
pCO2 arterial: 32.9 — ABNORMAL LOW
pO2, Arterial: 95

## 2010-11-18 LAB — CBC
HCT: 21.9 — ABNORMAL LOW
HCT: 22.9 — ABNORMAL LOW
HCT: 36.4 — ABNORMAL LOW
Hemoglobin: 7.4 — CL
Hemoglobin: 8 — ABNORMAL LOW
Hemoglobin: 9.8 — ABNORMAL LOW
Hemoglobin: 9.9 — ABNORMAL LOW
MCHC: 33.5
MCHC: 34
MCHC: 34.3
MCV: 94
MCV: 94.1
MCV: 95
Platelets: 196
Platelets: 59 — ABNORMAL LOW
Platelets: 79 — ABNORMAL LOW
Platelets: 86 — ABNORMAL LOW
Platelets: 89 — ABNORMAL LOW
RDW: 13.5
RDW: 13.8
RDW: 13.9
RDW: 14.1
RDW: 14.2
WBC: 3.2 — ABNORMAL LOW
WBC: 4.2

## 2010-11-18 LAB — PREPARE FRESH FROZEN PLASMA

## 2010-11-18 LAB — COMPREHENSIVE METABOLIC PANEL
Albumin: 3.7
BUN: 21
Calcium: 9.1
Creatinine, Ser: 1.29
Potassium: 4.8
Total Protein: 6.3

## 2010-11-18 LAB — BASIC METABOLIC PANEL
BUN: 13
BUN: 14
CO2: 22
CO2: 25
Calcium: 7.8 — ABNORMAL LOW
Calcium: 8.3 — ABNORMAL LOW
Creatinine, Ser: 1.3
GFR calc Af Amer: 53 — ABNORMAL LOW
GFR calc non Af Amer: 43 — ABNORMAL LOW
GFR calc non Af Amer: 54 — ABNORMAL LOW
Glucose, Bld: 116 — ABNORMAL HIGH
Glucose, Bld: 129 — ABNORMAL HIGH
Glucose, Bld: 146 — ABNORMAL HIGH
Glucose, Bld: 103 — ABNORMAL HIGH
Potassium: 4
Potassium: 4.1
Sodium: 135
Sodium: 138
Sodium: 136

## 2010-11-18 LAB — TYPE AND SCREEN: Antibody Screen: NEGATIVE

## 2010-11-18 LAB — POCT I-STAT 3, ART BLOOD GAS (G3+)
Acid-base deficit: 5 — ABNORMAL HIGH
Bicarbonate: 18.9 — ABNORMAL LOW
Bicarbonate: 21.7
Bicarbonate: 22.5
Bicarbonate: 23.4
Operator id: 113031
Operator id: 113031
Operator id: 297551
Operator id: 3406
Patient temperature: 35.8
TCO2: 22
TCO2: 25
pCO2 arterial: 27.7 — ABNORMAL LOW
pCO2 arterial: 30.4 — ABNORMAL LOW
pCO2 arterial: 39.7
pH, Arterial: 7.379
pH, Arterial: 7.406
pH, Arterial: 7.514 — ABNORMAL HIGH
pH, Arterial: 7.591 — ABNORMAL HIGH
pO2, Arterial: 150 — ABNORMAL HIGH
pO2, Arterial: 165 — ABNORMAL HIGH
pO2, Arterial: 194 — ABNORMAL HIGH
pO2, Arterial: 340 — ABNORMAL HIGH

## 2010-11-18 LAB — POCT I-STAT 4, (NA,K, GLUC, HGB,HCT)
Glucose, Bld: 116 — ABNORMAL HIGH
Glucose, Bld: 98
HCT: 17 — ABNORMAL LOW
HCT: 32 — ABNORMAL LOW
HCT: 34 — ABNORMAL LOW
Hemoglobin: 10.9 — ABNORMAL LOW
Hemoglobin: 6.8 — CL
Operator id: 113031
Operator id: 3406
Operator id: 3406
Operator id: 3406
Potassium: 4.5
Potassium: 4.5
Potassium: 6.1 — ABNORMAL HIGH
Sodium: 137
Sodium: 138
Sodium: 138

## 2010-11-18 LAB — CREATININE, SERUM
Creatinine, Ser: 1.03
GFR calc non Af Amer: >60

## 2010-11-18 LAB — BLOOD GAS, ARTERIAL
FIO2: 0.21
O2 Saturation: 97.7
Patient temperature: 98.6
TCO2: 25.3
pH, Arterial: 7.412

## 2010-11-18 LAB — PROTIME-INR
INR: 1
INR: 1.7 — ABNORMAL HIGH
Prothrombin Time: 13.2
Prothrombin Time: 20.9 — ABNORMAL HIGH

## 2010-11-18 LAB — I-STAT EC8
Acid-base deficit: 5 — ABNORMAL HIGH
Bicarbonate: 19.6 — ABNORMAL LOW
Glucose, Bld: 123 — ABNORMAL HIGH
Sodium: 141
TCO2: 21
pH, Arterial: 7.404

## 2010-11-18 LAB — ABO/RH: ABO/RH(D): O NEG

## 2010-11-18 LAB — HEMOGLOBIN AND HEMATOCRIT, BLOOD
HCT: 21.5 — ABNORMAL LOW
Hemoglobin: 7.3 — CL

## 2010-11-18 LAB — MAGNESIUM: Magnesium: 2.5

## 2010-11-18 LAB — URINALYSIS, ROUTINE W REFLEX MICROSCOPIC
Bilirubin Urine: NEGATIVE
Ketones, ur: NEGATIVE
Nitrite: NEGATIVE
Protein, ur: NEGATIVE

## 2010-11-18 LAB — HEMOGLOBIN A1C
Hgb A1c MFr Bld: 5.3
Mean Plasma Glucose: 111

## 2010-11-18 LAB — PREPARE PLATELET PHERESIS

## 2010-11-18 LAB — PLATELET COUNT: Platelets: 93 — ABNORMAL LOW

## 2010-11-18 LAB — APTT: aPTT: 32

## 2011-02-26 ENCOUNTER — Other Ambulatory Visit: Payer: Self-pay | Admitting: Cardiology

## 2011-03-27 ENCOUNTER — Telehealth: Payer: Self-pay | Admitting: Cardiology

## 2011-03-27 NOTE — Telephone Encounter (Signed)
I spoke with the pt and his wife and scheduled the pt to see Dr Riley Kill on 05/03/11.

## 2011-03-27 NOTE — Telephone Encounter (Signed)
New Msg: pt wife calling wanting to speak with nurse to see if pt needs to have 6 month check up or if pt needs to fu with MD yearly. Please return pt call to discuss further.

## 2011-03-27 NOTE — Telephone Encounter (Signed)
Left message to call back.  Pt last seen 06/30/10 and Dr Riley Kill recommended follow-up in 6 MONTHS.

## 2011-05-03 ENCOUNTER — Ambulatory Visit: Payer: No Typology Code available for payment source | Admitting: Cardiology

## 2011-06-05 ENCOUNTER — Ambulatory Visit (INDEPENDENT_AMBULATORY_CARE_PROVIDER_SITE_OTHER): Payer: Medicare Other | Admitting: Cardiology

## 2011-06-05 ENCOUNTER — Encounter: Payer: Self-pay | Admitting: Cardiology

## 2011-06-05 VITALS — BP 122/78 | HR 45 | Ht 68.0 in | Wt 152.8 lb

## 2011-06-05 DIAGNOSIS — I2510 Atherosclerotic heart disease of native coronary artery without angina pectoris: Secondary | ICD-10-CM

## 2011-06-05 DIAGNOSIS — E7800 Pure hypercholesterolemia, unspecified: Secondary | ICD-10-CM

## 2011-06-05 DIAGNOSIS — R0602 Shortness of breath: Secondary | ICD-10-CM

## 2011-06-05 DIAGNOSIS — Z9889 Other specified postprocedural states: Secondary | ICD-10-CM

## 2011-06-05 DIAGNOSIS — I359 Nonrheumatic aortic valve disorder, unspecified: Secondary | ICD-10-CM

## 2011-06-05 DIAGNOSIS — I350 Nonrheumatic aortic (valve) stenosis: Secondary | ICD-10-CM | POA: Insufficient documentation

## 2011-06-05 NOTE — Progress Notes (Signed)
HPI:  He is in for follow up.  He is doing well.  He denies chest pain.  He and his wife walk multiple miles per day, and he really does not get short of breath.  He denies major symptoms, noting only that he will occasionally get mildly short of breath when he starts to go on the treadmill.  He and I again covered in detail the details behind discussion regarding continuation of dual antiplatelet therapy.    Current Outpatient Prescriptions  Medication Sig Dispense Refill  . aspirin 81 MG tablet Take 81 mg by mouth daily.        Marland Kitchen K-DUR 10 MEQ tablet TAKE (1) TABLET EVERY OTHER DAY.  30 each  6  . LASIX 20 MG tablet TAKE (1) TABLET EVERY OTHER DAY.  30 each  12  . Multiple Vitamin (MULTIVITAMIN) tablet Take 1 tablet by mouth daily.        Marland Kitchen PLAVIX 75 MG tablet TAKE 1 TABLET ONCE DAILY.  90 each  3  . simvastatin (ZOCOR) 40 MG tablet Take 40 mg by mouth at bedtime.        . Tamsulosin HCl (FLOMAX) 0.4 MG CAPS Take 0.4 mg by mouth daily.      . TOPROL XL 25 MG 24 hr tablet TAKE 1 TABLET ONCE DAILY.  30 each  9  . triazolam (HALCION) 0.25 MG tablet Take 0.25 mg by mouth at bedtime as needed.          No Known Allergies  Past Medical History  Diagnosis Date  . Acute MI   . Undiagnosed cardiac murmurs   . Kidney stones   . Coronary artery disease   . Hyperlipidemia   . Hypertension   . Diverticulosis 06-12-2003    Colonoscopy  . MVP (mitral valve prolapse)     Past Surgical History  Procedure Date  . Coronary artery bypass graft     2/09  . Mitral valve repair     2/09    Family History  Problem Relation Age of Onset  . Hypertension Other   . Colon cancer Neg Hx     History   Social History  . Marital Status: Married    Spouse Name: N/A    Number of Children: 2  . Years of Education: N/A   Occupational History  . Engineering    Social History Main Topics  . Smoking status: Never Smoker   . Smokeless tobacco: Never Used  . Alcohol Use: Yes     rare  . Drug  Use: No  . Sexually Active: Not on file   Other Topics Concern  . Not on file   Social History Narrative   One caffeine drink daily     ROS: Please see the HPI.  All other systems reviewed and negative.  PHYSICAL EXAM:  BP 122/78  Pulse 45  Ht 5\' 8"  (1.727 m)  Wt 152 lb 12.8 oz (69.31 kg)  BMI 23.23 kg/m2  General: Well developed, well nourished, in no acute distress. Head:  Normocephalic and atraumatic. Neck: no JVD Lungs: Clear to auscultation and percussion.  Slightly prolonged expiration.   Heart: Normal S1 and S2. SEM  About 1-2/6 without diastolic murmur.   Abdomen:  Normal bowel sounds; soft; non tender; no organomegaly Pulses: Pulses normal in all 4 extremities. Extremities: No clubbing or cyanosis. No edema. Neurologic: Alert and oriented x 3.  EKG:  SB.  Borderline first degree av block.  ECHO 2011  Study Conclusions  - Left ventricle: The cavity size was normal. Wall thickness was increased in a pattern of moderate LVH. Systolic function was normal. The estimated ejection fraction was in the range of 50% to 55%. Hypokinesis of the inferior and inferoseptal myocardium. - Ventricular septum: Septal motion showed abnormal function and dyssynergy. - Aortic valve: Transvalvular velocity was minimally increased. There was very mild stenosis. Mild regurgitation. Valve area: 2.1cm^2(VTI). Valve area: 2.24cm^2 (Vmax). - Mitral valve: The findings are consistent with mild to moderate stenosis. Valve area by pressure half-time: 1.33cm^2. Valve area by continuity equation (using LVOT flow): 1.9cm^2. - Left atrium: The atrium was moderately dilated. - Right atrium: The atrium was moderately dilated. - Atrial septum: No defect or patent foramen ovale was identified. - Pulmonary arteries: PA peak pressure: 31mm Hg (S).    ASSESSMENT AND PLAN:

## 2011-06-05 NOTE — Patient Instructions (Signed)
Please have your potassium and kidney function checked with your PCP.  Your physician wants you to follow-up in: 6 MONTHS.  You will receive a reminder letter in the mail two months in advance. If you don't receive a letter, please call our office to schedule the follow-up appointment.  Your physician recommends that you continue on your current medications as directed. Please refer to the Current Medication list given to you today.

## 2011-06-05 NOTE — Assessment & Plan Note (Signed)
May be a combo of factors.  Does well on very low dose furosemide with KCL supplement.  Needs to have BMET checked when seeing Dr. Chilton Si.

## 2011-06-05 NOTE — Assessment & Plan Note (Signed)
Had patent stents at time of cath, but first generation placed in setting of MI.  As such, there is risk of VLST.  Issues discussed.  He will remain on DAPT.  Patient agreeable.  If risk of bleeding goes up, then revise strategy.

## 2011-06-05 NOTE — Assessment & Plan Note (Signed)
Patient had mitral repair.  Has mild AS and compatible murmur with this.  Recommend continued observation.  Would repeat echo at the end of this year  (for AS)

## 2011-06-05 NOTE — Assessment & Plan Note (Signed)
Has murmur and echo.  No real symptoms.  Gradient low.  Will need follow up echo over time.

## 2011-06-05 NOTE — Assessment & Plan Note (Signed)
Needs lipid and liver assessed with Dr. Chilton Si.

## 2011-06-27 ENCOUNTER — Other Ambulatory Visit: Payer: Self-pay

## 2011-06-27 ENCOUNTER — Other Ambulatory Visit: Payer: Self-pay | Admitting: Cardiology

## 2011-06-27 MED ORDER — METOPROLOL SUCCINATE ER 25 MG PO TB24: 25.0000 mg | ORAL_TABLET | Freq: Every day | ORAL | Status: DC

## 2011-07-21 ENCOUNTER — Other Ambulatory Visit: Payer: Self-pay | Admitting: Cardiology

## 2011-07-21 MED ORDER — CLOPIDOGREL BISULFATE 75 MG PO TABS: 75.0000 mg | ORAL_TABLET | Freq: Every day | ORAL | Status: DC

## 2011-10-04 ENCOUNTER — Other Ambulatory Visit: Payer: Self-pay | Admitting: Cardiology

## 2011-10-04 NOTE — Telephone Encounter (Signed)
Fax Received. Refill Completed. [name] [name] (R.M.A)   

## 2011-12-04 ENCOUNTER — Encounter: Payer: Self-pay | Admitting: Cardiology

## 2011-12-04 ENCOUNTER — Ambulatory Visit (INDEPENDENT_AMBULATORY_CARE_PROVIDER_SITE_OTHER): Payer: Medicare Other | Admitting: Cardiology

## 2011-12-04 VITALS — BP 126/80 | HR 58 | Resp 18 | Ht 68.0 in | Wt 149.0 lb

## 2011-12-04 DIAGNOSIS — Z9889 Other specified postprocedural states: Secondary | ICD-10-CM

## 2011-12-04 DIAGNOSIS — I2510 Atherosclerotic heart disease of native coronary artery without angina pectoris: Secondary | ICD-10-CM

## 2011-12-04 DIAGNOSIS — I350 Nonrheumatic aortic (valve) stenosis: Secondary | ICD-10-CM

## 2011-12-04 DIAGNOSIS — I25810 Atherosclerosis of coronary artery bypass graft(s) without angina pectoris: Secondary | ICD-10-CM

## 2011-12-04 DIAGNOSIS — R0602 Shortness of breath: Secondary | ICD-10-CM

## 2011-12-04 DIAGNOSIS — I359 Nonrheumatic aortic valve disorder, unspecified: Secondary | ICD-10-CM

## 2011-12-04 NOTE — Progress Notes (Signed)
   HPI:  The patient is doing extremely well. He has perhaps very mild shortness of breath with exertion. He's not had any unusual bleeding, and he, his wife, and I went into great detail today about the issue of first generation stents, or placement in acute coronary syndromes, and continued or not continued use of dual antiplatelet therapy. He feels really quite well and has had regular followup with Dr. Chilton Si including laboratory studies.  Current Outpatient Prescriptions  Medication Sig Dispense Refill  . aspirin 81 MG tablet Take 81 mg by mouth daily.        . clopidogrel (PLAVIX) 75 MG tablet Take 1 tablet (75 mg total) by mouth daily.  90 tablet  3  . K-DUR 10 MEQ tablet TAKE (1) TABLET EVERY OTHER DAY.  30 each  6  . LASIX 20 MG tablet TAKE (1) TABLET EVERY OTHER DAY.  15 each  5  . metoprolol succinate (TOPROL XL) 25 MG 24 hr tablet Take 1 tablet (25 mg total) by mouth daily.  30 tablet  5  . Multiple Vitamin (MULTIVITAMIN) tablet Take 1 tablet by mouth daily.        . simvastatin (ZOCOR) 40 MG tablet Take 40 mg by mouth at bedtime.        . Tamsulosin HCl (FLOMAX) 0.4 MG CAPS Take 0.4 mg by mouth daily.      . triazolam (HALCION) 0.25 MG tablet Take 0.25 mg by mouth at bedtime as needed.          No Known Allergies  Past Medical History  Diagnosis Date  . Acute MI   . Undiagnosed cardiac murmurs   . Kidney stones   . Coronary artery disease   . Hyperlipidemia   . Hypertension   . Diverticulosis 06-12-2003    Colonoscopy  . MVP (mitral valve prolapse)     Past Surgical History  Procedure Date  . Coronary artery bypass graft     2/09  . Mitral valve repair     2/09    Family History  Problem Relation Age of Onset  . Hypertension Other   . Colon cancer Neg Hx     History   Social History  . Marital Status: Married    Spouse Name: N/A    Number of Children: 2  . Years of Education: N/A   Occupational History  . Engineering    Social History Main Topics  .  Smoking status: Never Smoker   . Smokeless tobacco: Never Used  . Alcohol Use: Yes     rare  . Drug Use: No  . Sexually Active: Not on file   Other Topics Concern  . Not on file   Social History Narrative   One caffeine drink daily     ROS: Please see the HPI.  All other systems reviewed and negative.  PHYSICAL EXAM:  BP 126/80  Pulse 58  Resp 18  Ht 5\' 8"  (1.727 m)  Wt 149 lb (67.586 kg)  BMI 22.66 kg/m2  General: Well developed, well nourished, in no acute distress. Head:  Normocephalic and atraumatic. Neck: no JVD Lungs: Clear to auscultation and percussion. Heart: Normal S1 and S2.  2/6 SEM.  No DM>   Pulses: Pulses normal in all 4 extremities. Extremities: No clubbing or cyanosis. No edema. Neurologic: Alert and oriented x 3.  EKG:  NSR.  First degree av block.  Left axis deviation.  ASSESSMENT AND PLAN:

## 2011-12-04 NOTE — Assessment & Plan Note (Signed)
The patient's last chest x-ray was in 2009. His shortness of breath has not changed since then.  Will repeat.

## 2011-12-04 NOTE — Patient Instructions (Addendum)
Your physician has requested that you have an echocardiogram in February 2014. Echocardiography is a painless test that uses sound waves to create images of your heart. It provides your doctor with information about the size and shape of your heart and how well your heart's chambers and valves are working. This procedure takes approximately one hour. There are no restrictions for this procedure.  Your physician recommends that you schedule a follow-up appointment in: February 2014 with Dr Riley Kill  A chest x-ray takes a picture of the organs and structures inside the chest, including the heart, lungs, and blood vessels. This test can show several things, including, whether the heart is enlarges; whether fluid is building up in the lungs; and whether pacemaker / defibrillator leads are still in place. PLEASE HAVE THIS PERFORMED IN THE [facility] ACROSS FROM [facility].  Your physician recommends that you continue on your current medications as directed. Please refer to the Current Medication list given to you today.

## 2011-12-04 NOTE — Assessment & Plan Note (Signed)
Stable at the present time.  Will have him get a follow up echo in February when he is here for his final visit.

## 2011-12-04 NOTE — Assessment & Plan Note (Signed)
The patient will need to have followup, we will repeat his echo in February. At that time we will reassess his aortic valve. I will see him back in February, and after that I will turn over the care of Dr. Excell Seltzer.

## 2011-12-04 NOTE — Assessment & Plan Note (Signed)
The patient still has an intact drug-eluting stent placed in the setting of acute coronary syndrome. We have had a lengthy discussion regarding his Plavix. He's not had a problem with bleeding. As such, he will continue for now and we'll make a final decision will see him back early next spring.  He has a first generation drug-eluting stent the proximal portion of his posterior descending artery.

## 2011-12-25 ENCOUNTER — Other Ambulatory Visit: Payer: Self-pay | Admitting: Cardiology

## 2012-03-18 ENCOUNTER — Ambulatory Visit (INDEPENDENT_AMBULATORY_CARE_PROVIDER_SITE_OTHER)
Admission: RE | Admit: 2012-03-18 | Discharge: 2012-03-18 | Disposition: A | Discharge status: Home / Self Care | Payer: Medicare Other | Source: Ambulatory Visit | Attending: Cardiology | Admitting: Cardiology

## 2012-03-18 ENCOUNTER — Other Ambulatory Visit: Payer: Self-pay

## 2012-03-18 DIAGNOSIS — Z9889 Other specified postprocedural states: Secondary | ICD-10-CM

## 2012-03-18 DIAGNOSIS — I350 Nonrheumatic aortic (valve) stenosis: Secondary | ICD-10-CM

## 2012-03-18 DIAGNOSIS — I359 Nonrheumatic aortic valve disorder, unspecified: Secondary | ICD-10-CM

## 2012-03-18 DIAGNOSIS — I2510 Atherosclerotic heart disease of native coronary artery without angina pectoris: Secondary | ICD-10-CM

## 2012-03-18 DIAGNOSIS — R0602 Shortness of breath: Secondary | ICD-10-CM

## 2012-03-18 MED ORDER — FUROSEMIDE 20 MG PO TABS: ORAL_TABLET | ORAL | Status: DC

## 2012-03-18 MED ORDER — POTASSIUM CHLORIDE ER 10 MEQ PO TBCR: EXTENDED_RELEASE_TABLET | ORAL | Status: DC

## 2012-04-03 ENCOUNTER — Other Ambulatory Visit (HOSPITAL_COMMUNITY): Payer: Medicare Other

## 2012-04-08 ENCOUNTER — Other Ambulatory Visit: Payer: Self-pay

## 2012-04-08 ENCOUNTER — Ambulatory Visit (HOSPITAL_COMMUNITY): Payer: Medicare Other | Attending: Cardiology | Admitting: Radiology

## 2012-04-08 DIAGNOSIS — I2510 Atherosclerotic heart disease of native coronary artery without angina pectoris: Secondary | ICD-10-CM

## 2012-04-08 DIAGNOSIS — I080 Rheumatic disorders of both mitral and aortic valves: Secondary | ICD-10-CM | POA: Insufficient documentation

## 2012-04-08 DIAGNOSIS — Z9889 Other specified postprocedural states: Secondary | ICD-10-CM

## 2012-04-08 DIAGNOSIS — I359 Nonrheumatic aortic valve disorder, unspecified: Secondary | ICD-10-CM

## 2012-04-08 DIAGNOSIS — R0602 Shortness of breath: Secondary | ICD-10-CM

## 2012-04-08 DIAGNOSIS — I059 Rheumatic mitral valve disease, unspecified: Secondary | ICD-10-CM

## 2012-04-08 DIAGNOSIS — I252 Old myocardial infarction: Secondary | ICD-10-CM | POA: Insufficient documentation

## 2012-04-08 DIAGNOSIS — E785 Hyperlipidemia, unspecified: Secondary | ICD-10-CM | POA: Insufficient documentation

## 2012-04-08 DIAGNOSIS — I350 Nonrheumatic aortic (valve) stenosis: Secondary | ICD-10-CM

## 2012-04-08 DIAGNOSIS — R0609 Other forms of dyspnea: Secondary | ICD-10-CM | POA: Insufficient documentation

## 2012-04-08 DIAGNOSIS — I10 Essential (primary) hypertension: Secondary | ICD-10-CM | POA: Insufficient documentation

## 2012-04-08 DIAGNOSIS — R0989 Other specified symptoms and signs involving the circulatory and respiratory systems: Secondary | ICD-10-CM | POA: Insufficient documentation

## 2012-04-08 NOTE — Progress Notes (Signed)
Echocardiogram performed.  

## 2012-04-10 ENCOUNTER — Ambulatory Visit (INDEPENDENT_AMBULATORY_CARE_PROVIDER_SITE_OTHER): Payer: Medicare Other | Admitting: Cardiology

## 2012-04-10 ENCOUNTER — Encounter: Payer: Self-pay | Admitting: Cardiology

## 2012-04-10 VITALS — BP 132/70 | HR 47 | Ht 68.0 in | Wt 151.0 lb

## 2012-04-10 DIAGNOSIS — I359 Nonrheumatic aortic valve disorder, unspecified: Secondary | ICD-10-CM

## 2012-04-10 DIAGNOSIS — I25810 Atherosclerosis of coronary artery bypass graft(s) without angina pectoris: Secondary | ICD-10-CM

## 2012-04-10 DIAGNOSIS — Z9889 Other specified postprocedural states: Secondary | ICD-10-CM

## 2012-04-10 DIAGNOSIS — E7800 Pure hypercholesterolemia, unspecified: Secondary | ICD-10-CM

## 2012-04-10 DIAGNOSIS — I350 Nonrheumatic aortic (valve) stenosis: Secondary | ICD-10-CM

## 2012-04-10 DIAGNOSIS — R0602 Shortness of breath: Secondary | ICD-10-CM

## 2012-04-10 DIAGNOSIS — I2510 Atherosclerotic heart disease of native coronary artery without angina pectoris: Secondary | ICD-10-CM

## 2012-04-10 LAB — BASIC METABOLIC PANEL
GFR: 69.68 mL/min (ref >60.00)
Glucose, Bld: 80 mg/dL (ref 70–99)
Potassium: 4 mEq/L (ref 3.5–5.1)
Sodium: 136 mEq/L (ref 135–145)

## 2012-04-10 NOTE — Assessment & Plan Note (Signed)
Had CABG but also prior PCI with first gen stent.  Flow to any of these territories is suspect as he probably had normal pre op flow.  As such, supply to those territories uncertain and protected by first GEN DES placed for MI.  As such, would continue clopidogrel which he has taken since first placement.  Prior studies discussed with patient.

## 2012-04-10 NOTE — Patient Instructions (Addendum)
BMET today.  Your physician wants you to follow-up in: 4 months with Dr. Shirlee Latch. You will receive a reminder letter in the mail two months in advance. If you don't receive a letter, please call our office to schedule the follow-up appointment.

## 2012-04-10 NOTE — Assessment & Plan Note (Signed)
Followed by Dr Green 

## 2012-04-10 NOTE — Assessment & Plan Note (Signed)
Chronic and unchanged.  Continue monitoring with follow up echo.

## 2012-04-10 NOTE — Assessment & Plan Note (Signed)
He has class II symptoms that precede an AV disease.  Natural history reviewed.  He will likely need TAVR when this gets worse.

## 2012-04-10 NOTE — Assessment & Plan Note (Signed)
Stable at present.  Continue to monitor.  Prior surgery by Barry Dienes.

## 2012-04-10 NOTE — Progress Notes (Signed)
HPI:  This very nice gentleman returns today in a followup visit. To briefly summarize, he has had mitral valve repair. His prior myocardial infarction with placement of first generation drug-eluting stents. As such, based upon the clinical presentation and first generation we have recommended continued dual antiplatelet therapy. He's always had some mild shortness of breath, and this basically has not changed. We reviewed in detail today with the findings of his chest x-ray an echocardiogram. He has no new or progressive symptoms. I have arranged his followup with Dr. Jearld Pies.    Current Outpatient Prescriptions  Medication Sig Dispense Refill  . aspirin 81 MG tablet Take 81 mg by mouth daily.        . clopidogrel (PLAVIX) 75 MG tablet Take 1 tablet (75 mg total) by mouth daily.  90 tablet  3  . furosemide (LASIX) 20 MG tablet TAKE (1) TABLET EVERY OTHER DAY.  15 tablet  5  . K-DUR 10 MEQ tablet TAKE (1) TABLET EVERY OTHER DAY.  30 each  6  . metoprolol succinate (TOPROL-XL) 25 MG 24 hr tablet TAKE 1 TABLET ONCE DAILY.  30 tablet  6  . Multiple Vitamin (MULTIVITAMIN) tablet Take 1 tablet by mouth daily.        . potassium chloride (K-DUR) 10 MEQ tablet TAKE (1) TABLET EVERY OTHER DAY.  15 tablet  5  . simvastatin (ZOCOR) 40 MG tablet Take 40 mg by mouth at bedtime.        . Tamsulosin HCl (FLOMAX) 0.4 MG CAPS Take 0.4 mg by mouth daily.      . triazolam (HALCION) 0.25 MG tablet Take 0.25 mg by mouth at bedtime as needed.         No current facility-administered medications for this visit.    No Known Allergies  Past Medical History  Diagnosis Date  . Acute MI   . Undiagnosed cardiac murmurs   . Kidney stones   . Coronary artery disease   . Hyperlipidemia   . Hypertension   . Diverticulosis 06-12-2003    Colonoscopy  . MVP (mitral valve prolapse)     Past Surgical History  Procedure Laterality Date  . Coronary artery bypass graft      2/09  . Mitral valve repair      2/09     Family History  Problem Relation Age of Onset  . Hypertension Other   . Colon cancer Neg Hx     History   Social History  . Marital Status: Married    Spouse Name: N/A    Number of Children: 2  . Years of Education: N/A   Occupational History  . Engineering    Social History Main Topics  . Smoking status: Never Smoker   . Smokeless tobacco: Never Used  . Alcohol Use: Yes     Comment: rare  . Drug Use: No  . Sexually Active: Not on file   Other Topics Concern  . Not on file   Social History Narrative   One caffeine drink daily     ROS: Please see the HPI.  All other systems reviewed and negative.  PHYSICAL EXAM:  BP 132/70  Pulse 47  Ht 5\' 8"  (1.727 m)  Wt 151 lb (68.493 kg)  BMI 22.96 kg/m2  SpO2 99%  General: Well developed, well nourished, in no acute distress. Head:  Normocephalic and atraumatic. Neck: no JVD Lungs: Clear to auscultation and percussion. Heart: Normal S1 and S2. SEM 2-3/6  No diastolic  murmur.  Preserved S2.  No rumble.   Abdomen:  Normal bowel sounds; soft; non tender; no organomegaly Pulses: Pulses normal in all 4 extremities. Extremities: No clubbing or cyanosis. No edema. Neurologic: Alert and oriented x 3.  EKG:  SB with first degree av block.  i RBBB.  ECHO  Study Conclusions  - Left ventricle: The cavity size was normal. There was mild focal basal hypertrophy of the septum. Systolic function was normal. The estimated ejection fraction was in the range of 50% to 55%. There is hypokinesis of the mid-distalinferior myocardium. - Aortic valve: There was mild stenosis. Mild regurgitation. Mean gradient: 12mm Hg (S). Peak gradient: 24mm Hg (S). - Mitral valve: Prior procedures included surgical repair. The findings are consistent with mild to moderate stenosis. Mild regurgitation. Mean gradient: 3mm Hg (D). Peak gradient: 11mm Hg (D). Valve area by pressure half-time: 1.36cm^2. Valve area by continuity equation (using  LVOT flow): 1.62cm^2. - Left atrium: The atrium was mildly to moderately dilated. - Right ventricle: The cavity size was mildly dilated. - Pulmonary arteries: PA peak pressure: 37mm Hg (S).   CXR  RADIOLOGY REPORT*  Clinical Data: Shortness of breath.  CHEST - 2 VIEW  Comparison: 05/20/2007  Findings: No confluent airspace opacities or effusions. Heart is normal size. Prior CABG and valve replacement. No acute bony abnormality.  IMPRESSION: No acute cardiopulmonary disease.   Original Report Authenticated By: Charlett Nose, M.D.        Last Resulted: 03/18/12 11:14 AM             ASSESSMENT AND PLAN:

## 2012-04-13 ENCOUNTER — Other Ambulatory Visit: Payer: Self-pay

## 2012-04-17 ENCOUNTER — Other Ambulatory Visit: Payer: Self-pay

## 2012-04-17 MED ORDER — CLOPIDOGREL BISULFATE 75 MG PO TABS: 75.0000 mg | ORAL_TABLET | Freq: Every day | ORAL | Status: DC

## 2012-07-22 ENCOUNTER — Other Ambulatory Visit: Payer: Self-pay | Admitting: Cardiology

## 2012-08-07 ENCOUNTER — Ambulatory Visit (INDEPENDENT_AMBULATORY_CARE_PROVIDER_SITE_OTHER): Payer: Medicare Other | Admitting: Cardiology

## 2012-08-07 ENCOUNTER — Encounter: Payer: Self-pay | Admitting: Cardiology

## 2012-08-07 VITALS — BP 132/80 | HR 54 | Ht 68.0 in | Wt 145.0 lb

## 2012-08-07 DIAGNOSIS — I350 Nonrheumatic aortic (valve) stenosis: Secondary | ICD-10-CM

## 2012-08-07 DIAGNOSIS — I359 Nonrheumatic aortic valve disorder, unspecified: Secondary | ICD-10-CM

## 2012-08-07 DIAGNOSIS — Z9889 Other specified postprocedural states: Secondary | ICD-10-CM

## 2012-08-07 DIAGNOSIS — R0602 Shortness of breath: Secondary | ICD-10-CM

## 2012-08-07 DIAGNOSIS — I2510 Atherosclerotic heart disease of native coronary artery without angina pectoris: Secondary | ICD-10-CM

## 2012-08-07 DIAGNOSIS — E785 Hyperlipidemia, unspecified: Secondary | ICD-10-CM

## 2012-08-07 NOTE — Patient Instructions (Addendum)
Your physician wants you to follow-up in: 4 months with Dr McLean. (October 2014).  You will receive a reminder letter in the mail two months in advance. If you don't receive a letter, please call our office to schedule the follow-up appointment.  

## 2012-08-08 NOTE — Progress Notes (Signed)
Patient ID: [name] [name], male   DOB: 1928-03-25, 79 y.o.   MRN: [id number] PCP: Dr. Chilton Si  79 yo with history of CAD s/p CABG and mitral regurgitation s/p mitral valve repair presents for cardiology followup.  He has been seen by Dr. Riley Kill in the past and is seen by me for the first time today.  I have reviewed all his recoreds. Patient has a long history of stable exertional dyspnea.  He continues to jog for exercise several times a week.   He will note shortness of breath if he starts jogging too fast initially and does not gradually warm up.  He is short of breath if he jogs up a flight of steps.  Otherwise, he does well in general.  No chest pain, orthopnea, or tachypalpitations.  He exercises on most days.   Labs (2/14): K 4, creatinine 1.1  PMH: 1. CAD: Acute MI in 2006 with ventricular fibrillation arrest.  Patient had 1st generation DES to LCx.  Patient then had CABG 2009 with LIMA-OM and SVG-PDA.  Plan has been to continue DAPT long-term because of 1st generation DES.  2. Nephrolithiasis 3. HTN 4. Hyperlipidemia 5. Diverticulosis 6. Aortic stenosis: Mild on 2/14 echo. 7. Mitral valve prolapse with severe MR: MV repair in 2009.  Echo (2/14) with EF 50-55%, inferior HK, mild AS with mean gradient 12 mmHg, s/p MV repair with mild MR and no significant MS (mean gradient 3 mmHg), mild RV dilation, PA systolic pressure 37 mmHg.   SH: Married with 2 children, lives in [place], runs Public relations account executive company.   FH: HTN  ROS: All systems reviewed and negative except as per HPI.   Current Outpatient Prescriptions  Medication Sig Dispense Refill  . aspirin 81 MG tablet Take 81 mg by mouth daily.        . clopidogrel (PLAVIX) 75 MG tablet Take 1 tablet (75 mg total) by mouth daily.  90 tablet  3  . furosemide (LASIX) 20 MG tablet TAKE (1) TABLET EVERY OTHER DAY.  15 tablet  5  . K-DUR 10 MEQ tablet TAKE (1) TABLET EVERY OTHER DAY.  30 each  6  . metoprolol succinate (TOPROL-XL) 25 MG 24 hr  tablet Take 1 tablet (25 mg total) by mouth daily.  30 tablet  11  . Multiple Vitamin (MULTIVITAMIN) tablet Take 1 tablet by mouth daily.        . simvastatin (ZOCOR) 40 MG tablet Take 40 mg by mouth at bedtime.        . Tamsulosin HCl (FLOMAX) 0.4 MG CAPS Take 0.4 mg by mouth daily.      . triazolam (HALCION) 0.25 MG tablet Take 0.25 mg by mouth at bedtime as needed.         No current facility-administered medications for this visit.    BP 132/80  Pulse 54  Ht 5\' 8"  (1.727 m)  Wt 145 lb (65.772 kg)  BMI 22.05 kg/m2  SpO2 98% General: NAD Neck: No JVD, no thyromegaly or thyroid nodule.  Lungs: Clear to auscultation bilaterally with normal respiratory effort. CV: Nondisplaced PMI.  Heart regular S1/S2, no S3/S4, 3/6 crescendo-decrescendo murmur RUSB with clear S2.  No peripheral edema.  No carotid bruit.  Normal pedal pulses.  Abdomen: Soft, nontender, no hepatosplenomegaly, no distention.   Neurologic: Alert and oriented x 3.  Psych: Normal affect. Extremities: No clubbing or cyanosis.   Assessment/Plan: 1. CAD: S/p 1st generation DES to LCx and later CABG.  Per review of past  notes, patient has been on ASA and Plavix long-term and plan had been to continue this regimen.  He also will continue on statin and beta blocker.  No ischemic symptoms.  2. S/p MV repair: Stable valve on echo this year.  3. Aortic stenosis: Mild on echo this year.  4. Dyspnea: This is chronic and mild.  He has good exercise tolerance overall.  He is not volume overloaded on exam.  Would continue Lasix as he has been doing (every other day).  5. Hyperlipidemia: Will call for lipids from PCP's office.  Continue statin.   Marca Ancona 08/08/2012

## 2012-09-03 ENCOUNTER — Other Ambulatory Visit: Payer: Self-pay | Admitting: *Deleted

## 2012-09-03 MED ORDER — FUROSEMIDE 20 MG PO TABS: ORAL_TABLET | ORAL | Status: DC

## 2012-09-03 MED ORDER — POTASSIUM CHLORIDE ER 10 MEQ PO TBCR: 10.0000 meq | EXTENDED_RELEASE_TABLET | ORAL | Status: DC

## 2012-10-02 ENCOUNTER — Other Ambulatory Visit: Payer: Self-pay

## 2013-01-02 ENCOUNTER — Other Ambulatory Visit: Payer: Self-pay

## 2013-02-23 ENCOUNTER — Other Ambulatory Visit: Payer: Self-pay | Admitting: Cardiology

## 2013-03-10 ENCOUNTER — Ambulatory Visit (INDEPENDENT_AMBULATORY_CARE_PROVIDER_SITE_OTHER): Payer: Medicare Other | Admitting: Cardiology

## 2013-03-10 ENCOUNTER — Encounter: Payer: Self-pay | Admitting: Cardiology

## 2013-03-10 VITALS — BP 144/70 | HR 46 | Ht 68.0 in | Wt 150.4 lb

## 2013-03-10 DIAGNOSIS — I350 Nonrheumatic aortic (valve) stenosis: Secondary | ICD-10-CM

## 2013-03-10 DIAGNOSIS — I2510 Atherosclerotic heart disease of native coronary artery without angina pectoris: Secondary | ICD-10-CM

## 2013-03-10 DIAGNOSIS — Z9889 Other specified postprocedural states: Secondary | ICD-10-CM

## 2013-03-10 DIAGNOSIS — R0602 Shortness of breath: Secondary | ICD-10-CM

## 2013-03-10 DIAGNOSIS — E785 Hyperlipidemia, unspecified: Secondary | ICD-10-CM

## 2013-03-10 DIAGNOSIS — I359 Nonrheumatic aortic valve disorder, unspecified: Secondary | ICD-10-CM

## 2013-03-10 MED ORDER — FUROSEMIDE 20 MG PO TABS: 20.0000 mg | ORAL_TABLET | Freq: Every day | ORAL | Status: DC

## 2013-03-10 MED ORDER — POTASSIUM CHLORIDE ER 10 MEQ PO TBCR: 10.0000 meq | EXTENDED_RELEASE_TABLET | Freq: Every day | ORAL | Status: DC

## 2013-03-10 NOTE — Progress Notes (Signed)
Patient ID: [name] [name], male   DOB: 1929/02/17, 80 y.o.   MRN: [id number] PCP: Dr. Chilton Si  80 yo with history of CAD s/p CABG and mitral regurgitation s/p mitral valve repair presents for cardiology followup.  Patient has a long history of stable exertional dyspnea.  He walks several miles with his wife a 3-4 times a week and occasionally jogs.  He is still working. He notes that right when he first starts to exercise, he feels short of breath.  He then warms up and continues to exercise without dyspnea.   His HR is in the 40s today but has been chronically in the 40s for many years.  Otherwise, he does well in general.  No chest pain, orthopnea, or tachypalpitations.  He exercises on most days.   Labs (2/14): K 4, creatinine 1.1  ECG: NSR, 1st degree AV block, iRBBB  PMH: 1. CAD: Acute MI in 2006 with ventricular fibrillation arrest.  Patient had 1st generation DES to LCx.  Patient then had CABG 2009 with LIMA-OM and SVG-PDA.  Plan has been to continue DAPT long-term because of 1st generation DES.  2. Nephrolithiasis 3. HTN 4. Hyperlipidemia 5. Diverticulosis 6. Aortic stenosis: Mild on 2/14 echo. 7. Mitral valve prolapse with severe MR: MV repair in 2009.  Echo (2/14) with EF 50-55%, inferior HK, mild AS with mean gradient 12 mmHg, s/p MV repair with mild MR and no significant MS (mean gradient 3 mmHg), mild RV dilation, PA systolic pressure 37 mmHg.   SH: Married with 2 children, lives in [place], runs Public relations account executive company.   FH: HTN  ROS: All systems reviewed and negative except as per HPI.   Current Outpatient Prescriptions  Medication Sig Dispense Refill  . aspirin 81 MG tablet Take 81 mg by mouth daily.        . clopidogrel (PLAVIX) 75 MG tablet Take 1 tablet (75 mg total) by mouth daily.  90 tablet  3  . metoprolol succinate (TOPROL-XL) 25 MG 24 hr tablet Take 1 tablet (25 mg total) by mouth daily.  30 tablet  11  . Multiple Vitamin (MULTIVITAMIN) tablet Take 1 tablet by mouth  daily.        . simvastatin (ZOCOR) 40 MG tablet Take 40 mg by mouth at bedtime.        . Tamsulosin HCl (FLOMAX) 0.4 MG CAPS Take 0.4 mg by mouth daily.      . triazolam (HALCION) 0.25 MG tablet Take 0.25 mg by mouth at bedtime as needed.        . furosemide (LASIX) 20 MG tablet Take 1 tablet (20 mg total) by mouth daily.  30 tablet  3  . potassium chloride (KLOR-CON 10) 10 MEQ tablet Take 1 tablet (10 mEq total) by mouth daily.  30 tablet  11   No current facility-administered medications for this visit.    BP 144/70  Pulse 46  Ht 5\' 8"  (1.727 m)  Wt 68.221 kg (150 lb 6.4 oz)  BMI 22.87 kg/m2 General: NAD Neck: No JVD, no thyromegaly or thyroid nodule.  Lungs: Clear to auscultation bilaterally with normal respiratory effort. CV: Nondisplaced PMI.  Heart regular S1/S2, no S3/S4, 2/6 crescendo-decrescendo murmur RUSB with clear S2.  No peripheral edema.  No carotid bruit.  Normal pedal pulses.  Abdomen: Soft, nontender, no hepatosplenomegaly, no distention.   Neurologic: Alert and oriented x 3.  Psych: Normal affect. Extremities: No clubbing or cyanosis.   Assessment/Plan: 1. CAD: S/p 1st generation DES to  LCx and later CABG.  Patient has been on ASA and Plavix long-term and plan to continue this regimen.  He also will continue on statin and beta blocker.  No ischemic symptoms.  2. S/p MV repair: Stable valve on last echo.  3. Aortic stenosis: Mild on last echo.  4. Dyspnea:  He has good exercise tolerance overall but gets short of breath when he first starts to exercise (feels better with time).  He is not volume overloaded on exam.  I am going to have him try taking Lasix and KCl daily rather than qod.  If this helps, continue daily Lasix.  I will check BMET and BNP in 2 wks.  5. Hyperlipidemia: Continue statin. Check lipids today.   Marca AnconaDalton McLean 03/10/2013

## 2013-03-10 NOTE — Patient Instructions (Signed)
Increase lasix(furosemide) to 20mg  daily.  Increase KCL(potassium) to 10 mEq daily.  Your physician recommends that you return for a FASTING lipid profile /BMET/BNP in 2 weeks.   Your physician wants you to follow-up in: 1 year with Dr Shirlee Latch. (January 2016).  You will receive a reminder letter in the mail two months in advance. If you don't receive a letter, please call our office to schedule the follow-up appointment.

## 2013-03-17 ENCOUNTER — Other Ambulatory Visit (INDEPENDENT_AMBULATORY_CARE_PROVIDER_SITE_OTHER): Payer: Medicare Other

## 2013-03-17 DIAGNOSIS — I2510 Atherosclerotic heart disease of native coronary artery without angina pectoris: Secondary | ICD-10-CM

## 2013-03-17 DIAGNOSIS — R0602 Shortness of breath: Secondary | ICD-10-CM

## 2013-03-17 DIAGNOSIS — I359 Nonrheumatic aortic valve disorder, unspecified: Secondary | ICD-10-CM

## 2013-03-17 LAB — BASIC METABOLIC PANEL
BUN: 20 mg/dL (ref 6–23)
CO2: 27 mEq/L (ref 19–32)
Calcium: 8.8 mg/dL (ref 8.4–10.5)
Chloride: 106 mEq/L (ref 96–112)
Creatinine, Ser: 1.1 mg/dL (ref 0.4–1.5)
GFR: 71.81 mL/min (ref >60.00)
Glucose, Bld: 97 mg/dL (ref 70–99)
POTASSIUM: 4 meq/L (ref 3.5–5.1)
SODIUM: 139 meq/L (ref 135–145)

## 2013-03-17 LAB — LIPID PANEL
Cholesterol: 130 mg/dL (ref 0–200)
HDL: 57.3 mg/dL (ref >39.00)
LDL CALC: 64 mg/dL (ref 0–99)
Total CHOL/HDL Ratio: 2
Triglycerides: 42 mg/dL (ref 0.0–149.0)
VLDL: 8.4 mg/dL (ref 0.0–40.0)

## 2013-03-17 LAB — BRAIN NATRIURETIC PEPTIDE: Pro B Natriuretic peptide (BNP): 112 pg/mL — ABNORMAL HIGH (ref 0.0–100.0)

## 2013-03-18 ENCOUNTER — Telehealth: Payer: Self-pay | Admitting: Cardiology

## 2013-03-18 NOTE — Telephone Encounter (Signed)
Pt.notified

## 2013-03-18 NOTE — Telephone Encounter (Signed)
New message     Pt is calling wanting his blood test results

## 2013-03-24 ENCOUNTER — Other Ambulatory Visit: Payer: Medicare Other

## 2013-04-20 ENCOUNTER — Other Ambulatory Visit: Payer: Self-pay | Admitting: Cardiology

## 2013-07-23 ENCOUNTER — Other Ambulatory Visit: Payer: Self-pay | Admitting: Cardiology

## 2014-01-24 ENCOUNTER — Other Ambulatory Visit: Payer: Self-pay | Admitting: Cardiology

## 2014-02-25 ENCOUNTER — Other Ambulatory Visit: Payer: Self-pay | Admitting: Cardiology

## 2014-03-23 ENCOUNTER — Other Ambulatory Visit: Payer: Self-pay | Admitting: Cardiology

## 2014-03-29 ENCOUNTER — Other Ambulatory Visit: Payer: Self-pay | Admitting: Cardiology

## 2014-04-24 ENCOUNTER — Ambulatory Visit (INDEPENDENT_AMBULATORY_CARE_PROVIDER_SITE_OTHER): Payer: Medicare Other | Admitting: Cardiology

## 2014-04-24 ENCOUNTER — Encounter: Payer: Self-pay | Admitting: Cardiology

## 2014-04-24 VITALS — BP 182/62 | HR 57 | Ht 68.0 in | Wt 151.6 lb

## 2014-04-24 DIAGNOSIS — I350 Nonrheumatic aortic (valve) stenosis: Secondary | ICD-10-CM

## 2014-04-24 DIAGNOSIS — I25810 Atherosclerosis of coronary artery bypass graft(s) without angina pectoris: Secondary | ICD-10-CM

## 2014-04-24 DIAGNOSIS — Z9889 Other specified postprocedural states: Secondary | ICD-10-CM

## 2014-04-24 LAB — LIPID PANEL
CHOLESTEROL: 128 mg/dL (ref 0–200)
HDL: 53.1 mg/dL (ref >39.00)
LDL CALC: 66 mg/dL (ref 0–99)
NonHDL: 74.9
TRIGLYCERIDES: 46 mg/dL (ref 0.0–149.0)
Total CHOL/HDL Ratio: 2
VLDL: 9.2 mg/dL (ref 0.0–40.0)

## 2014-04-24 LAB — BASIC METABOLIC PANEL
BUN: 24 mg/dL — ABNORMAL HIGH (ref 6–23)
CHLORIDE: 106 meq/L (ref 96–112)
CO2: 29 meq/L (ref 19–32)
Calcium: 9.1 mg/dL (ref 8.4–10.5)
Creatinine, Ser: 1.03 mg/dL (ref 0.40–1.50)
GFR: 73.23 mL/min (ref >60.00)
Glucose, Bld: 94 mg/dL (ref 70–99)
POTASSIUM: 4.1 meq/L (ref 3.5–5.1)
SODIUM: 138 meq/L (ref 135–145)

## 2014-04-24 NOTE — Patient Instructions (Signed)
Your physician recommends that you have lab work today: bmp/ lipid  Your physician has requested that you have an echocardiogram. Echocardiography is a painless test that uses sound waves to create images of your heart. It provides your doctor with information about the size and shape of your heart and how well your heart's chambers and valves are working. This procedure takes approximately one hour. There are no restrictions for this procedure.  Your physician wants you to follow-up in: 1 year with Dr. Shirlee Latch (February 2016). You will receive a reminder letter in the mail two months in advance. If you don't receive a letter, please call our office to schedule the follow-up appointment.  Please monitor your blood pressure once daily for the next 2 weeks. Please take this at least 30 minutes - 1 hour after taking your medications. Please call us in 2 weeks and let us know how these are doing for you.

## 2014-04-26 NOTE — Progress Notes (Signed)
Patient ID: [name] [name], male   DOB: 1928/06/15, 81 y.o.   MRN: [id number] PCP: Dr. Chilton Si  81 yo with history of CAD s/p CABG and mitral regurgitation s/p mitral valve repair presents for cardiology followup.  Patient has a long history of stable exertional dyspnea.  He walks several miles with his wife a 3-4 times a week and occasionally jogs.  He is still working. He notes that right when he first starts to exercise, he feels short of breath.  He then warms up and continues to exercise without dyspnea.  Otherwise, he does well in general.  No chest pain, orthopnea, or tachypalpitations.  He exercises on most days.    BP is high today.  He has taken his meds.  He says that he has been under a lot of stress today from work.    Labs (2/14): K 4, creatinine 1.1 Labs (1/15): K 4, creatinine 1.1, LDL 64, HDL 57, pro-BNP 112  ECG: NSR, PVC, iRBBB  PMH: 1. CAD: Acute MI in 2006 with ventricular fibrillation arrest.  Patient had 1st generation DES to LCx.  Patient then had CABG 2009 with LIMA-OM and SVG-PDA.  Plan has been to continue DAPT long-term because of 1st generation DES.  2. Nephrolithiasis 3. HTN 4. Hyperlipidemia 5. Diverticulosis 6. Aortic stenosis: Mild on 2/14 echo. 7. Mitral valve prolapse with severe MR: MV repair in 2009.  Echo (2/14) with EF 50-55%, inferior HK, mild AS with mean gradient 12 mmHg, s/p MV repair with mild MR and no significant MS (mean gradient 3 mmHg), mild RV dilation, PA systolic pressure 37 mmHg.   SH: Married with 2 children, lives in [place], runs Public relations account executive company.   FH: HTN  ROS: All systems reviewed and negative except as per HPI.   Current Outpatient Prescriptions  Medication Sig Dispense Refill  . aspirin 81 MG tablet Take 81 mg by mouth daily.      . clopidogrel (PLAVIX) 75 MG tablet TAKE 1 TABLET ONCE DAILY. 30 tablet 0  . furosemide (LASIX) 20 MG tablet TAKE 1 TABLET DAILY. 30 tablet 3  . metoprolol succinate (TOPROL-XL) 25 MG 24 hr tablet  TAKE 1 TABLET ONCE DAILY. 30 tablet 2  . Multiple Vitamin (MULTIVITAMIN) tablet Take 1 tablet by mouth daily.      . potassium chloride (K-DUR) 10 MEQ tablet TAKE 1 TABLET DAILY. 30 tablet 0  . simvastatin (ZOCOR) 40 MG tablet Take 40 mg by mouth at bedtime.      . Tamsulosin HCl (FLOMAX) 0.4 MG CAPS Take 0.4 mg by mouth daily.    . triazolam (HALCION) 0.25 MG tablet Take 0.25 mg by mouth at bedtime as needed.       No current facility-administered medications for this visit.    BP 182/62 mmHg  Pulse 57  Ht  (1.727 m)  Wt 151 lb 9.6 oz (68.765 kg)  BMI 23.06 kg/m2 General: NAD Neck: No JVD, no thyromegaly or thyroid nodule.  Lungs: Clear to auscultation bilaterally with normal respiratory effort. CV: Nondisplaced PMI.  Heart regular S1/S2, no S3/S4, 2/6 crescendo-decrescendo murmur RUSB with clear S2.  No peripheral edema.  No carotid bruit.  Normal pedal pulses.  Abdomen: Soft, nontender, no hepatosplenomegaly, no distention.   Neurologic: Alert and oriented x 3.  Psych: Normal affect. Extremities: No clubbing or cyanosis.   Assessment/Plan: 1. CAD: S/p 1st generation DES to LCx and later CABG.  Patient has been on ASA and Plavix long-term and plan to continue  this regimen.  He also will continue on statin and beta blocker.  No ischemic symptoms.  2. S/p MV repair: I will get an echo to reassess the repaired mitral valve.   3. Aortic stenosis: Mild on last echo. As above, will repeat echo to follow.  4. Dyspnea:  Mild stable dyspnea with very good exercise tolerance.  He takes a low dose of Lasix but this really has not seemed to make much difference. 5. Hyperlipidemia: Continue statin. Check lipids today.  6. HTN: BP high today but he has been under stress.  He will check BP daily at home and we will call in 2 wks for readings.   Marca Ancona 04/26/2014

## 2014-04-29 ENCOUNTER — Other Ambulatory Visit: Payer: Self-pay | Admitting: Cardiology

## 2014-04-29 ENCOUNTER — Other Ambulatory Visit (HOSPITAL_COMMUNITY): Payer: Medicare Other

## 2014-04-29 ENCOUNTER — Ambulatory Visit (HOSPITAL_COMMUNITY): Payer: Medicare Other | Attending: Cardiology | Admitting: Radiology

## 2014-04-29 DIAGNOSIS — Z9889 Other specified postprocedural states: Secondary | ICD-10-CM

## 2014-04-29 DIAGNOSIS — I359 Nonrheumatic aortic valve disorder, unspecified: Secondary | ICD-10-CM | POA: Insufficient documentation

## 2014-04-29 NOTE — Progress Notes (Signed)
Echocardiogram performed.  

## 2014-04-30 ENCOUNTER — Other Ambulatory Visit: Payer: Self-pay

## 2014-04-30 MED ORDER — METOPROLOL SUCCINATE ER 25 MG PO TB24: 25.0000 mg | ORAL_TABLET | Freq: Every day | ORAL | Status: DC

## 2014-04-30 MED ORDER — SIMVASTATIN 40 MG PO TABS: 40.0000 mg | ORAL_TABLET | Freq: Every day | ORAL | Status: DC

## 2014-04-30 MED ORDER — CLOPIDOGREL BISULFATE 75 MG PO TABS: 75.0000 mg | ORAL_TABLET | Freq: Every day | ORAL | Status: DC

## 2014-04-30 MED ORDER — FUROSEMIDE 20 MG PO TABS: 20.0000 mg | ORAL_TABLET | Freq: Every day | ORAL | Status: DC

## 2014-04-30 MED ORDER — POTASSIUM CHLORIDE ER 10 MEQ PO TBCR: 10.0000 meq | EXTENDED_RELEASE_TABLET | Freq: Every day | ORAL | Status: DC

## 2014-05-06 ENCOUNTER — Telehealth: Payer: Self-pay | Admitting: Cardiology

## 2014-05-06 NOTE — Telephone Encounter (Signed)
Follow Up ° ° ° ° ° ° °Pt returning Anne's phone call. °

## 2014-05-06 NOTE — Telephone Encounter (Signed)
LMTCB

## 2014-05-06 NOTE — Telephone Encounter (Signed)
Spoke with patient about recent echo results 

## 2014-05-11 ENCOUNTER — Telehealth: Payer: Self-pay | Admitting: Cardiology

## 2014-05-11 NOTE — Telephone Encounter (Signed)
New message     Pt c/o BP issue: STAT if pt c/o blurred vision, one-sided weakness or slurred speech  1. What are your last 5 BP readings? Feb 26 121/61; feb 27 118/59;  Feb 28 135/58; feb 29 135/59; mar 1 120/61; march 2 128/62; mar 3 118/55; march 4 131/58; march  5 132/55; march 6 136/46; march 7 111/66; march 8 133/61; march 9 145/67; march 10 115/60; march 11 148/65; march 12 127/69; 2. Are you having any other symptoms (ex. Dizziness, headache, blurred vision, passed out)? no 3. What is your BP issue?  Pt is calling to give 2wk bp readings

## 2014-05-11 NOTE — Telephone Encounter (Signed)
Pt.notified

## 2014-05-11 NOTE — Telephone Encounter (Signed)
Busy

## 2014-05-11 NOTE — Telephone Encounter (Signed)
I will forward to Dr McLean for review 

## 2014-05-11 NOTE — Telephone Encounter (Signed)
BP ok 

## 2014-07-23 ENCOUNTER — Other Ambulatory Visit: Payer: Self-pay | Admitting: Cardiology

## 2015-02-20 ENCOUNTER — Other Ambulatory Visit: Payer: Self-pay | Admitting: Cardiology

## 2015-03-26 ENCOUNTER — Other Ambulatory Visit: Payer: Self-pay | Admitting: Cardiology

## 2015-04-14 DIAGNOSIS — J449 Chronic obstructive pulmonary disease, unspecified: Secondary | ICD-10-CM | POA: Diagnosis not present

## 2015-04-14 DIAGNOSIS — I10 Essential (primary) hypertension: Secondary | ICD-10-CM | POA: Diagnosis not present

## 2015-04-24 ENCOUNTER — Other Ambulatory Visit: Payer: Self-pay | Admitting: Cardiology

## 2015-04-26 DIAGNOSIS — N401 Enlarged prostate with lower urinary tract symptoms: Secondary | ICD-10-CM | POA: Diagnosis not present

## 2015-04-26 DIAGNOSIS — N5201 Erectile dysfunction due to arterial insufficiency: Secondary | ICD-10-CM | POA: Diagnosis not present

## 2015-04-26 DIAGNOSIS — R972 Elevated prostate specific antigen [PSA]: Secondary | ICD-10-CM | POA: Diagnosis not present

## 2015-04-26 DIAGNOSIS — R351 Nocturia: Secondary | ICD-10-CM | POA: Diagnosis not present

## 2015-05-17 DIAGNOSIS — H903 Sensorineural hearing loss, bilateral: Secondary | ICD-10-CM | POA: Diagnosis not present

## 2015-05-17 DIAGNOSIS — H61312 Acquired stenosis of left external ear canal secondary to trauma: Secondary | ICD-10-CM | POA: Diagnosis not present

## 2015-05-17 DIAGNOSIS — H6122 Impacted cerumen, left ear: Secondary | ICD-10-CM | POA: Diagnosis not present

## 2015-05-25 ENCOUNTER — Other Ambulatory Visit: Payer: Self-pay | Admitting: Cardiology

## 2015-05-28 ENCOUNTER — Telehealth: Payer: Self-pay | Admitting: Cardiology

## 2015-05-28 NOTE — Telephone Encounter (Signed)
New message   Pt called states that he received 15 pills of Metoprolol and he is requesting a call back to discuss if he will need  To continue with this medication

## 2015-05-31 MED ORDER — METOPROLOL SUCCINATE ER 25 MG PO TB24: 25.0000 mg | ORAL_TABLET | Freq: Every day | ORAL | Status: DC

## 2015-05-31 NOTE — Telephone Encounter (Signed)
Pt advised to continue metoprolol.  Pt advised metoprolol will be refilled until his appt with Dr Shirlee Latch 07/28/15. Pt advised to call if he needs refills of other cardiac medication before appt with Dr Shirlee Latch 07/28/15.

## 2015-06-12 ENCOUNTER — Other Ambulatory Visit: Payer: Self-pay | Admitting: Cardiology

## 2015-07-07 ENCOUNTER — Other Ambulatory Visit: Payer: Self-pay | Admitting: Cardiology

## 2015-07-13 DIAGNOSIS — Z85828 Personal history of other malignant neoplasm of skin: Secondary | ICD-10-CM | POA: Diagnosis not present

## 2015-07-13 DIAGNOSIS — D225 Melanocytic nevi of trunk: Secondary | ICD-10-CM | POA: Diagnosis not present

## 2015-07-13 DIAGNOSIS — L821 Other seborrheic keratosis: Secondary | ICD-10-CM | POA: Diagnosis not present

## 2015-07-13 DIAGNOSIS — D2271 Melanocytic nevi of right lower limb, including hip: Secondary | ICD-10-CM | POA: Diagnosis not present

## 2015-07-13 DIAGNOSIS — D2261 Melanocytic nevi of right upper limb, including shoulder: Secondary | ICD-10-CM | POA: Diagnosis not present

## 2015-07-13 DIAGNOSIS — D2272 Melanocytic nevi of left lower limb, including hip: Secondary | ICD-10-CM | POA: Diagnosis not present

## 2015-07-13 DIAGNOSIS — L814 Other melanin hyperpigmentation: Secondary | ICD-10-CM | POA: Diagnosis not present

## 2015-07-13 DIAGNOSIS — D1801 Hemangioma of skin and subcutaneous tissue: Secondary | ICD-10-CM | POA: Diagnosis not present

## 2015-07-13 DIAGNOSIS — D692 Other nonthrombocytopenic purpura: Secondary | ICD-10-CM | POA: Diagnosis not present

## 2015-07-24 ENCOUNTER — Other Ambulatory Visit: Payer: Self-pay | Admitting: Cardiology

## 2015-07-28 ENCOUNTER — Ambulatory Visit (INDEPENDENT_AMBULATORY_CARE_PROVIDER_SITE_OTHER): Payer: Medicare Other | Admitting: Cardiology

## 2015-07-28 ENCOUNTER — Encounter: Payer: Self-pay | Admitting: Cardiology

## 2015-07-28 VITALS — BP 134/70 | HR 55 | Ht 68.0 in | Wt 141.8 lb

## 2015-07-28 DIAGNOSIS — I25810 Atherosclerosis of coronary artery bypass graft(s) without angina pectoris: Secondary | ICD-10-CM

## 2015-07-28 DIAGNOSIS — I350 Nonrheumatic aortic (valve) stenosis: Secondary | ICD-10-CM

## 2015-07-28 DIAGNOSIS — R0989 Other specified symptoms and signs involving the circulatory and respiratory systems: Secondary | ICD-10-CM | POA: Diagnosis not present

## 2015-07-28 DIAGNOSIS — I6529 Occlusion and stenosis of unspecified carotid artery: Secondary | ICD-10-CM

## 2015-07-28 DIAGNOSIS — Z9889 Other specified postprocedural states: Secondary | ICD-10-CM

## 2015-07-28 DIAGNOSIS — R0602 Shortness of breath: Secondary | ICD-10-CM | POA: Diagnosis not present

## 2015-07-28 LAB — BASIC METABOLIC PANEL
BUN: 23 mg/dL (ref 7–25)
CO2: 25 mmol/L (ref 20–31)
Calcium: 9.4 mg/dL (ref 8.6–10.3)
Chloride: 105 mmol/L (ref 98–110)
Creat: 1.21 mg/dL — ABNORMAL HIGH (ref 0.70–1.11)
Glucose, Bld: 95 mg/dL (ref 65–99)
POTASSIUM: 4.3 mmol/L (ref 3.5–5.3)
SODIUM: 141 mmol/L (ref 135–146)

## 2015-07-28 LAB — LIPID PANEL
CHOL/HDL RATIO: 2 ratio (ref <=5.0)
CHOLESTEROL: 145 mg/dL (ref 125–200)
HDL: 71 mg/dL (ref >=40)
LDL Cholesterol: 57 mg/dL (ref <130)
Triglycerides: 85 mg/dL (ref <150)
VLDL: 17 mg/dL (ref <30)

## 2015-07-28 NOTE — Progress Notes (Signed)
Patient ID: [name] [name], male   DOB: 26-Mar-1928, 82 y.o.   MRN: [id number] PCP: Dr. Chilton Si  82 yo with history of CAD s/p CABG and mitral regurgitation s/p mitral valve repair presents for cardiology followup.  Patient has a long history of stable mild exertional dyspnea.  He jogs several times a week and will note mild dyspnea while jogging. Otherwise, he does well in general.  No dyspnea walking or going up stairs.  No chest pain, orthopnea, or tachypalpitations.  He exercises on most days. He is still working.  BP is controlled.    Labs (2/14): K 4, creatinine 1.1 Labs (1/15): K 4, creatinine 1.1, LDL 64, HDL 57, pro-BNP 112 Labs (9/79): K 4.1, creatinine 1.03, LDL 66, HDl 53  ECG: NSR, iRBBB  PMH: 1. CAD: Acute MI in 2006 with ventricular fibrillation arrest.  Patient had 1st generation DES to LCx.  Patient then had CABG 2009 with LIMA-OM and SVG-PDA.  Plan has been to continue DAPT long-term because of 1st generation DES.  2. Nephrolithiasis 3. HTN 4. Hyperlipidemia 5. Diverticulosis 6. Aortic stenosis: Mild on 3/16 echo. 7. Mitral valve prolapse with severe MR: MV repair in 2009.  Echo (2/14) with EF 50-55%, inferior HK, mild AS with mean gradient 12 mmHg, s/p MV repair with mild MR and no significant MS (mean gradient 3 mmHg), mild RV dilation, PA systolic pressure 37 mmHg.  Echo (3/16) with EF 50-55%, mild focal basal septal hypertrophy, mild aortic stenosis mean gradient 14 mmHg, s/p MV repair with mild MR and no MS, PASP 38 mmHg.   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   FH: HTN  ROS: All systems reviewed and negative except as per HPI.   Current Outpatient Prescriptions  Medication Sig Dispense Refill  . amoxicillin (AMOXIL) 500 MG capsule Take 4 capsules by mouth as needed. (Take one (1) hour prior to dental procedures.)  1  . aspirin 81 MG tablet Take 81 mg by mouth daily.      . clopidogrel (PLAVIX) 75 MG tablet TAKE 1 TABLET ONCE DAILY.  90 tablet 0  . furosemide (LASIX) 20 MG tablet Take 1 tablet (20 mg total) by mouth daily. 90 tablet 3  . metoprolol succinate (TOPROL-XL) 25 MG 24 hr tablet Take 1 tablet (25 mg total) by mouth daily. 30 tablet 1  . Multiple Vitamin (MULTIVITAMIN) tablet Take 1 tablet by mouth daily.      . potassium chloride (K-DUR) 10 MEQ tablet Take 1 tablet (10 mEq total) by mouth daily. 90 tablet 3  . Tamsulosin HCl (FLOMAX) 0.4 MG CAPS Take 0.4 mg by mouth daily.    . simvastatin (ZOCOR) 40 MG tablet Take 1 tablet (40 mg total) by mouth at bedtime.     No current facility-administered medications for this visit.    BP 134/70 mmHg  Pulse 55  Ht 5\' 8"  (1.727 m)  Wt 141 lb 12.8 oz (64.32 kg)  BMI 21.57 kg/m2 General: NAD Neck: No JVD, no thyromegaly or thyroid nodule.  Lungs: Clear to auscultation bilaterally with normal respiratory effort. CV: Nondisplaced PMI.  Heart regular S1/S2, no S3/S4, 2/6 crescendo-decrescendo murmur RUSB with clear S2.  No peripheral edema.  Soft left carotid bruit.  Normal pedal pulses.  Abdomen: Soft, nontender, no hepatosplenomegaly, no distention.   Neurologic: Alert and oriented x 3.  Psych: Normal affect. Extremities: No clubbing or cyanosis.   Assessment/Plan: 1. CAD: S/p 1st generation DES to LCx and later  CABG.  Patient has been on ASA and Plavix long-term and plan to continue this regimen.  He also will continue on statin and beta blocker.  No ischemic symptoms.  2. S/p MV repair: MV stable on 3/16 echo.    3. Aortic stenosis: Mild on 3/16 echo. Repeat echo in another 1-2 years.  4. Dyspnea:  Mild stable dyspnea with very good exercise tolerance.  He takes a low dose of Lasix but this really has not seemed to make much difference.  BMET/BNP today.  5. Hyperlipidemia: Continue statin. Check lipids today.  6. HTN: BP controlled.  7. Carotid bruit: I will arrange for carotid dopplers.   Marca Ancona 07/28/2015

## 2015-07-28 NOTE — Patient Instructions (Signed)
Medication Instructions:  Your physician recommends that you continue on your current medications as directed. Please refer to the Current Medication list given to you today.   Labwork: BMET/BNP/Lipid profile today  Testing/Procedures: Your physician has requested that you have a carotid duplex. This test is an ultrasound of the carotid arteries in your neck. It looks at blood flow through these arteries that supply the brain with blood. Allow one hour for this exam. There are no restrictions or special instructions.    Follow-Up: Your physician wants you to follow-up in: 1 year with Dr Shirlee Latch. (May 2018).  You will receive a reminder letter in the mail two months in advance. If you don't receive a letter, please call our office to schedule the follow-up appointment.        If you need a refill on your cardiac medications before your next appointment, please call your pharmacy.

## 2015-07-29 LAB — BRAIN NATRIURETIC PEPTIDE: BRAIN NATRIURETIC PEPTIDE: 101.1 pg/mL — AB (ref <100)

## 2015-08-02 ENCOUNTER — Ambulatory Visit (HOSPITAL_COMMUNITY)
Admission: RE | Admit: 2015-08-02 | Discharge: 2015-08-02 | Disposition: A | Discharge status: Home / Self Care | Payer: Medicare Other | Source: Ambulatory Visit | Attending: Cardiovascular Disease | Admitting: Cardiovascular Disease

## 2015-08-02 DIAGNOSIS — I6523 Occlusion and stenosis of bilateral carotid arteries: Secondary | ICD-10-CM | POA: Insufficient documentation

## 2015-08-02 DIAGNOSIS — R0989 Other specified symptoms and signs involving the circulatory and respiratory systems: Secondary | ICD-10-CM | POA: Insufficient documentation

## 2015-08-02 DIAGNOSIS — I350 Nonrheumatic aortic (valve) stenosis: Secondary | ICD-10-CM | POA: Diagnosis not present

## 2015-08-02 DIAGNOSIS — I10 Essential (primary) hypertension: Secondary | ICD-10-CM | POA: Insufficient documentation

## 2015-08-02 DIAGNOSIS — E785 Hyperlipidemia, unspecified: Secondary | ICD-10-CM | POA: Diagnosis not present

## 2015-08-02 DIAGNOSIS — I25810 Atherosclerosis of coronary artery bypass graft(s) without angina pectoris: Secondary | ICD-10-CM | POA: Insufficient documentation

## 2015-08-02 DIAGNOSIS — I2510 Atherosclerotic heart disease of native coronary artery without angina pectoris: Secondary | ICD-10-CM | POA: Insufficient documentation

## 2015-08-06 ENCOUNTER — Telehealth: Payer: Self-pay | Admitting: *Deleted

## 2015-08-06 DIAGNOSIS — R9389 Abnormal findings on diagnostic imaging of other specified body structures: Secondary | ICD-10-CM

## 2015-08-06 NOTE — Telephone Encounter (Signed)
Copied from carotid doppler report 08/02/15: Abnormal structure in the right thyroid, as described above.  Notes Recorded by Laurey Morale, MD on 08/03/2015 at 5:25 PM Carotids only mild disease. Has nodule noted in thyroid, recommend dedicated thyroid US to evaluate.

## 2015-08-10 ENCOUNTER — Other Ambulatory Visit: Payer: Self-pay | Admitting: Cardiology

## 2015-08-13 ENCOUNTER — Ambulatory Visit (HOSPITAL_COMMUNITY)
Admission: RE | Admit: 2015-08-13 | Discharge: 2015-08-13 | Disposition: A | Discharge status: Home / Self Care | Payer: Medicare Other | Source: Ambulatory Visit | Attending: Cardiology | Admitting: Cardiology

## 2015-08-13 DIAGNOSIS — E041 Nontoxic single thyroid nodule: Secondary | ICD-10-CM | POA: Diagnosis not present

## 2015-08-13 DIAGNOSIS — R946 Abnormal results of thyroid function studies: Secondary | ICD-10-CM | POA: Diagnosis present

## 2015-08-13 DIAGNOSIS — R9389 Abnormal findings on diagnostic imaging of other specified body structures: Secondary | ICD-10-CM

## 2015-08-17 ENCOUNTER — Other Ambulatory Visit: Payer: Self-pay | Admitting: Emergency Medicine

## 2015-08-17 ENCOUNTER — Telehealth: Payer: Self-pay | Admitting: *Deleted

## 2015-08-17 DIAGNOSIS — E041 Nontoxic single thyroid nodule: Secondary | ICD-10-CM

## 2015-08-17 NOTE — Telephone Encounter (Signed)
Copied from US Thyroid done 08/13/15:  Notes Recorded by Laurey Morale, MD on 08/15/2015 at 10:45 PM Consistent with multinodular goiter, which typically is benign. However, 1 nodule is large enough to recommend biopsy to rule out malignancy. Would recommend IR referral for nodule biopsy.

## 2015-08-23 ENCOUNTER — Ambulatory Visit (HOSPITAL_COMMUNITY)
Admission: RE | Admit: 2015-08-23 | Discharge: 2015-08-23 | Disposition: A | Discharge status: Home / Self Care | Payer: Medicare Other | Source: Ambulatory Visit | Attending: Cardiology | Admitting: Cardiology

## 2015-08-23 DIAGNOSIS — E041 Nontoxic single thyroid nodule: Secondary | ICD-10-CM

## 2015-08-23 DIAGNOSIS — E042 Nontoxic multinodular goiter: Secondary | ICD-10-CM | POA: Diagnosis not present

## 2015-08-23 NOTE — Procedures (Signed)
US guided FNA x 6 dominant right inferior thyroid nodule. Path pending. No immediate complications. Medication used- 1% lidocaine to skin/SQ tissue.

## 2015-09-12 ENCOUNTER — Other Ambulatory Visit: Payer: Self-pay | Admitting: Cardiology

## 2015-10-19 DIAGNOSIS — C4441 Basal cell carcinoma of skin of scalp and neck: Secondary | ICD-10-CM | POA: Diagnosis not present

## 2015-10-19 DIAGNOSIS — I2510 Atherosclerotic heart disease of native coronary artery without angina pectoris: Secondary | ICD-10-CM | POA: Diagnosis not present

## 2015-10-19 DIAGNOSIS — I10 Essential (primary) hypertension: Secondary | ICD-10-CM | POA: Diagnosis not present

## 2015-10-19 DIAGNOSIS — J449 Chronic obstructive pulmonary disease, unspecified: Secondary | ICD-10-CM | POA: Diagnosis not present

## 2015-10-19 DIAGNOSIS — Z0000 Encounter for general adult medical examination without abnormal findings: Secondary | ICD-10-CM | POA: Diagnosis not present

## 2015-10-29 ENCOUNTER — Other Ambulatory Visit: Payer: Self-pay | Admitting: Cardiology

## 2015-12-20 DIAGNOSIS — Z23 Encounter for immunization: Secondary | ICD-10-CM | POA: Diagnosis not present

## 2015-12-20 DIAGNOSIS — I10 Essential (primary) hypertension: Secondary | ICD-10-CM | POA: Diagnosis not present

## 2015-12-20 DIAGNOSIS — I2510 Atherosclerotic heart disease of native coronary artery without angina pectoris: Secondary | ICD-10-CM | POA: Diagnosis not present

## 2016-01-04 ENCOUNTER — Other Ambulatory Visit: Payer: Self-pay | Admitting: Cardiology

## 2016-03-17 DIAGNOSIS — N401 Enlarged prostate with lower urinary tract symptoms: Secondary | ICD-10-CM | POA: Diagnosis not present

## 2016-03-17 DIAGNOSIS — N5201 Erectile dysfunction due to arterial insufficiency: Secondary | ICD-10-CM | POA: Diagnosis not present

## 2016-03-17 DIAGNOSIS — R351 Nocturia: Secondary | ICD-10-CM | POA: Diagnosis not present

## 2016-04-03 DIAGNOSIS — H524 Presbyopia: Secondary | ICD-10-CM | POA: Diagnosis not present

## 2016-04-21 DIAGNOSIS — I10 Essential (primary) hypertension: Secondary | ICD-10-CM | POA: Diagnosis not present

## 2016-04-21 DIAGNOSIS — N401 Enlarged prostate with lower urinary tract symptoms: Secondary | ICD-10-CM | POA: Diagnosis not present

## 2016-04-21 DIAGNOSIS — I2510 Atherosclerotic heart disease of native coronary artery without angina pectoris: Secondary | ICD-10-CM | POA: Diagnosis not present

## 2016-06-05 DIAGNOSIS — H903 Sensorineural hearing loss, bilateral: Secondary | ICD-10-CM | POA: Diagnosis not present

## 2016-06-05 DIAGNOSIS — H6122 Impacted cerumen, left ear: Secondary | ICD-10-CM | POA: Diagnosis not present

## 2016-06-05 DIAGNOSIS — H61312 Acquired stenosis of left external ear canal secondary to trauma: Secondary | ICD-10-CM | POA: Diagnosis not present

## 2016-07-03 ENCOUNTER — Other Ambulatory Visit: Payer: Self-pay | Admitting: Cardiology

## 2016-07-14 ENCOUNTER — Other Ambulatory Visit: Payer: Self-pay | Admitting: Cardiology

## 2016-07-17 DIAGNOSIS — I2510 Atherosclerotic heart disease of native coronary artery without angina pectoris: Secondary | ICD-10-CM | POA: Diagnosis not present

## 2016-07-17 DIAGNOSIS — M1712 Unilateral primary osteoarthritis, left knee: Secondary | ICD-10-CM | POA: Diagnosis not present

## 2016-07-18 DIAGNOSIS — H5712 Ocular pain, left eye: Secondary | ICD-10-CM | POA: Diagnosis not present

## 2016-07-29 ENCOUNTER — Other Ambulatory Visit: Payer: Self-pay | Admitting: Cardiology

## 2016-08-03 ENCOUNTER — Other Ambulatory Visit: Payer: Self-pay | Admitting: Cardiology

## 2016-08-08 ENCOUNTER — Other Ambulatory Visit: Payer: Self-pay | Admitting: Cardiology

## 2016-08-14 DIAGNOSIS — S51831S Puncture wound without foreign body of right forearm, sequela: Secondary | ICD-10-CM | POA: Diagnosis not present

## 2016-08-15 DIAGNOSIS — S51831D Puncture wound without foreign body of right forearm, subsequent encounter: Secondary | ICD-10-CM | POA: Diagnosis not present

## 2016-08-16 DIAGNOSIS — S51831D Puncture wound without foreign body of right forearm, subsequent encounter: Secondary | ICD-10-CM | POA: Diagnosis not present

## 2016-08-18 DIAGNOSIS — S51831D Puncture wound without foreign body of right forearm, subsequent encounter: Secondary | ICD-10-CM | POA: Diagnosis not present

## 2016-08-31 ENCOUNTER — Other Ambulatory Visit: Payer: Self-pay | Admitting: Cardiology

## 2016-09-01 DIAGNOSIS — H6122 Impacted cerumen, left ear: Secondary | ICD-10-CM | POA: Diagnosis not present

## 2016-09-01 DIAGNOSIS — Z974 Presence of external hearing-aid: Secondary | ICD-10-CM | POA: Diagnosis not present

## 2016-09-01 DIAGNOSIS — Z7289 Other problems related to lifestyle: Secondary | ICD-10-CM | POA: Diagnosis not present

## 2016-09-01 DIAGNOSIS — H9193 Unspecified hearing loss, bilateral: Secondary | ICD-10-CM | POA: Diagnosis not present

## 2016-09-04 ENCOUNTER — Other Ambulatory Visit (HOSPITAL_COMMUNITY): Payer: Self-pay | Admitting: Cardiology

## 2016-09-04 ENCOUNTER — Other Ambulatory Visit: Payer: Self-pay | Admitting: Cardiology

## 2016-09-04 ENCOUNTER — Emergency Department (HOSPITAL_COMMUNITY)
Admission: EM | Admit: 2016-09-04 | Discharge: 2016-09-04 | Disposition: A | Discharge status: Home / Self Care | Payer: Medicare Other | Attending: Emergency Medicine | Admitting: Emergency Medicine

## 2016-09-04 ENCOUNTER — Encounter (HOSPITAL_COMMUNITY): Payer: Self-pay | Admitting: Emergency Medicine

## 2016-09-04 DIAGNOSIS — Z7902 Long term (current) use of antithrombotics/antiplatelets: Secondary | ICD-10-CM | POA: Diagnosis not present

## 2016-09-04 DIAGNOSIS — I2510 Atherosclerotic heart disease of native coronary artery without angina pectoris: Secondary | ICD-10-CM | POA: Diagnosis not present

## 2016-09-04 DIAGNOSIS — R339 Retention of urine, unspecified: Secondary | ICD-10-CM | POA: Diagnosis not present

## 2016-09-04 DIAGNOSIS — Z7982 Long term (current) use of aspirin: Secondary | ICD-10-CM | POA: Diagnosis not present

## 2016-09-04 DIAGNOSIS — R100 Acute abdomen: Secondary | ICD-10-CM | POA: Diagnosis not present

## 2016-09-04 DIAGNOSIS — Z951 Presence of aortocoronary bypass graft: Secondary | ICD-10-CM | POA: Insufficient documentation

## 2016-09-04 DIAGNOSIS — I10 Essential (primary) hypertension: Secondary | ICD-10-CM | POA: Diagnosis not present

## 2016-09-04 LAB — BASIC METABOLIC PANEL
ANION GAP: 8 (ref 5–15)
BUN: 21 mg/dL — AB (ref 6–20)
CHLORIDE: 104 mmol/L (ref 101–111)
CO2: 24 mmol/L (ref 22–32)
Calcium: 9.1 mg/dL (ref 8.9–10.3)
Creatinine, Ser: 1.12 mg/dL (ref 0.61–1.24)
GFR calc Af Amer: >60 mL/min (ref >60)
GFR, EST NON AFRICAN AMERICAN: 58 mL/min — AB (ref >60)
GLUCOSE: 131 mg/dL — AB (ref 65–99)
POTASSIUM: 4.2 mmol/L (ref 3.5–5.1)
Sodium: 136 mmol/L (ref 135–145)

## 2016-09-04 LAB — URINALYSIS, ROUTINE W REFLEX MICROSCOPIC
Bacteria, UA: NONE SEEN
Bilirubin Urine: NEGATIVE
GLUCOSE, UA: NEGATIVE mg/dL
HGB URINE DIPSTICK: NEGATIVE
Ketones, ur: NEGATIVE mg/dL
Nitrite: NEGATIVE
PH: 6 (ref 5.0–8.0)
Protein, ur: NEGATIVE mg/dL
SPECIFIC GRAVITY, URINE: 1.006 (ref 1.005–1.030)
SQUAMOUS EPITHELIAL / LPF: NONE SEEN

## 2016-09-04 LAB — CBC
HEMATOCRIT: 38.4 % — AB (ref 39.0–52.0)
HEMOGLOBIN: 12.6 g/dL — AB (ref 13.0–17.0)
MCH: 31.7 pg (ref 26.0–34.0)
MCHC: 32.8 g/dL (ref 30.0–36.0)
MCV: 96.5 fL (ref 78.0–100.0)
Platelets: 231 10*3/uL (ref 150–400)
RBC: 3.98 MIL/uL — ABNORMAL LOW (ref 4.22–5.81)
RDW: 13.5 % (ref 11.5–15.5)
WBC: 3.8 10*3/uL — ABNORMAL LOW (ref 4.0–10.5)

## 2016-09-04 MED ORDER — LIDOCAINE HCL 2 % EX GEL: 1.0000 "application " | Freq: Once | CUTANEOUS | Status: DC | PRN

## 2016-09-04 NOTE — Discharge Instructions (Signed)
Call your urologist today to schedule a follow-up visit

## 2016-09-04 NOTE — ED Triage Notes (Signed)
Patient called EMS this am b/c he was unable to urinate this am upon waking. Pt states he his unable to sit down due to increased pain and pressure. Patient denies hx of prostate problems.

## 2016-09-04 NOTE — ED Notes (Signed)
Bed: XG52 Expected date:  Expected time:  Means of arrival:  Comments: 83 yo M/Urinary retention

## 2016-09-04 NOTE — ED Notes (Signed)
Tried to get an oral temp 3x but it wouldn't read

## 2016-09-04 NOTE — ED Provider Notes (Signed)
[facility]-EMERGENCY DEPT Provider Note   CSN: [id number] Arrival date & time: 09/04/16  0614     History   Chief Complaint Chief Complaint  Patient presents with  . Urinary Retention    HPI [name] [name] is a 83 y.o. male.  This is an 83 year old male who presents with urinary retention 9 hours. History of same in the past but denies any history of BPH. Does take Plavix for history of CAD as well as some pink urine but denies any dysuria. Has some suprapubic pressure without flank pain. Symptoms have been persistent and worsening transient urinary. No treatment used for this prior to arrival      Past Medical History:  Diagnosis Date  . Acute MI (HCC)   . Coronary artery disease   . Diverticulosis 06-12-2003   Colonoscopy  . Hyperlipidemia   . Hypertension   . Kidney stones   . MVP (mitral valve prolapse)   . Undiagnosed cardiac murmurs     Patient Active Problem List   Diagnosis Date Noted  . Carotid stenosis 07/28/2015  . Aortic stenosis, mild 06/05/2011  . Special screening for malignant neoplasms, colon 09/21/2010  . Irregular heart rate 09/21/2010  . S/P mitral valve repair 07/07/2010  . Coronary artery disease 07/07/2010  . DYSPNEA 01/13/2009  . MITRAL VALVE PROLAPSE, NON-RHEUMATIC 07/16/2008  . HYPERCHOLESTEROLEMIA  IIA 03/11/2008  . HYPERLIPIDEMIA-MIXED 03/11/2008  . CAD, ARTERY BYPASS GRAFT 03/11/2008    Past Surgical History:  Procedure Laterality Date  . CORONARY ARTERY BYPASS GRAFT     2/09  . MITRAL VALVE REPAIR     2/09       Home Medications    Prior to Admission medications   Medication Sig Start Date End Date Taking? Authorizing Provider  amoxicillin (AMOXIL) 500 MG capsule Take 4 capsules by mouth as needed. (Take one (1) hour prior to dental procedures.) 07/21/15   [provider]  aspirin 81 MG tablet Take 81 mg by mouth daily.      [provider]  clopidogrel (PLAVIX) 75 MG tablet TAKE 1 TABLET ONCE DAILY. 07/31/16    Laurey Morale, MD  furosemide (LASIX) 20 MG tablet TAKE 1 TABLET DAILY. 08/11/15   Laurey Morale, MD  metoprolol succinate (TOPROL-XL) 25 MG 24 hr tablet Take 1 tablet (25 mg total) by mouth daily. Needs office visit 08/09/16   Laurey Morale, MD  Multiple Vitamin (MULTIVITAMIN) tablet Take 1 tablet by mouth daily.      [provider]  potassium chloride (K-DUR) 10 MEQ tablet Take 1 tablet (10 mEq total) by mouth daily. 04/30/14   Laurey Morale, MD  potassium chloride (K-DUR) 10 MEQ tablet TAKE 1 TABLET DAILY. 09/13/15   Laurey Morale, MD  simvastatin (ZOCOR) 40 MG tablet Take 1 tablet (40 mg total) by mouth daily. *Patient is overdue for an appt and needs to call and schedule for further refills 1st attempt* 08/04/16   Laurey Morale, MD  Tamsulosin HCl (FLOMAX) 0.4 MG CAPS Take 0.4 mg by mouth daily.    [provider]    Family History Family History  Problem Relation Age of Onset  . Hypertension Other   . Colon cancer Neg Hx     Social History Social History  Substance Use Topics  . Smoking status: Never Smoker  . Smokeless tobacco: Never Used  . Alcohol use Yes     Comment: rare     Allergies   Patient has no  known allergies.   Review of Systems Review of Systems  All other systems reviewed and are negative.    Physical Exam Updated Vital Signs BP (!) 162/84 (BP Location: Left Arm)   Pulse 71   Temp (!) 96.6 F (35.9 C) (Axillary)   Resp 18   SpO2 100%   Physical Exam  Constitutional: He is oriented to person, place, and time. He appears well-developed and well-nourished.  Non-toxic appearance. No distress.  HENT:  Head: Normocephalic and atraumatic.  Eyes: Conjunctivae, EOM and lids are normal. Pupils are equal, round, and reactive to light.  Neck: Normal range of motion. Neck supple. No tracheal deviation present. No thyroid mass present.  Cardiovascular: Normal rate, regular rhythm and normal heart sounds.  Exam reveals no  gallop.   No murmur heard. Pulmonary/Chest: Effort normal and breath sounds normal. No stridor. No respiratory distress. He has no decreased breath sounds. He has no wheezes. He has no rhonchi. He has no rales.  Abdominal: Soft. Normal appearance and bowel sounds are normal. He exhibits no distension. There is no tenderness. There is no rebound and no CVA tenderness.  Musculoskeletal: Normal range of motion. He exhibits no edema or tenderness.  Neurological: He is alert and oriented to person, place, and time. He has normal strength. No cranial nerve deficit or sensory deficit. GCS eye subscore is 4. GCS verbal subscore is 5. GCS motor subscore is 6.  Skin: Skin is warm and dry. No abrasion and no rash noted.  Psychiatric: He has a normal mood and affect. His speech is normal and behavior is normal.  Nursing note and vitals reviewed.    ED Treatments / Results  Labs (all labs ordered are listed, but only abnormal results are displayed) Labs Reviewed  CBC - Abnormal; Notable for the following:       Result Value   WBC 3.8 (*)    RBC 3.98 (*)    Hemoglobin 12.6 (*)    HCT 38.4 (*)    All other components within normal limits  URINALYSIS, ROUTINE W REFLEX MICROSCOPIC  BASIC METABOLIC PANEL    EKG  EKG Interpretation None       Radiology No results found.  Procedures Procedures (including critical care time)  Medications Ordered in ED Medications  lidocaine (XYLOCAINE) 2 % jelly 1 application (not administered)     Initial Impression / Assessment and Plan / ED Course  I have reviewed the triage vital signs and the nursing notes.  Pertinent labs & imaging results that were available during my care of the patient were reviewed by me and considered in my medical decision making (see chart for details).     Foley placed by nursing and patient feels much better. He has no evidence of infection of his urine and renal function is normal. Will follow-up with his  urologist  Final Clinical Impressions(s) / ED Diagnoses   Final diagnoses:  None    New Prescriptions New Prescriptions   No medications on file     Lorre Nick, MD 09/04/16 [phone]

## 2016-09-05 DIAGNOSIS — R338 Other retention of urine: Secondary | ICD-10-CM | POA: Diagnosis not present

## 2016-09-05 DIAGNOSIS — N401 Enlarged prostate with lower urinary tract symptoms: Secondary | ICD-10-CM | POA: Diagnosis not present

## 2016-09-08 DIAGNOSIS — R338 Other retention of urine: Secondary | ICD-10-CM | POA: Diagnosis not present

## 2016-09-08 DIAGNOSIS — N401 Enlarged prostate with lower urinary tract symptoms: Secondary | ICD-10-CM | POA: Diagnosis not present

## 2016-09-09 ENCOUNTER — Encounter (HOSPITAL_COMMUNITY): Payer: Self-pay | Admitting: Emergency Medicine

## 2016-09-09 ENCOUNTER — Emergency Department (HOSPITAL_COMMUNITY)
Admission: EM | Admit: 2016-09-09 | Discharge: 2016-09-09 | Disposition: A | Discharge status: Home / Self Care | Payer: Medicare Other | Attending: Emergency Medicine | Admitting: Emergency Medicine

## 2016-09-09 ENCOUNTER — Other Ambulatory Visit (HOSPITAL_COMMUNITY): Payer: Self-pay | Admitting: Cardiology

## 2016-09-09 ENCOUNTER — Emergency Department (HOSPITAL_COMMUNITY)
Admission: EM | Admit: 2016-09-09 | Discharge: 2016-09-09 | Discharge status: Absent without leave | Payer: Medicare Other

## 2016-09-09 DIAGNOSIS — Z79899 Other long term (current) drug therapy: Secondary | ICD-10-CM | POA: Diagnosis not present

## 2016-09-09 DIAGNOSIS — Z955 Presence of coronary angioplasty implant and graft: Secondary | ICD-10-CM | POA: Diagnosis not present

## 2016-09-09 DIAGNOSIS — Z7902 Long term (current) use of antithrombotics/antiplatelets: Secondary | ICD-10-CM | POA: Diagnosis not present

## 2016-09-09 DIAGNOSIS — I2510 Atherosclerotic heart disease of native coronary artery without angina pectoris: Secondary | ICD-10-CM | POA: Insufficient documentation

## 2016-09-09 DIAGNOSIS — Z7982 Long term (current) use of aspirin: Secondary | ICD-10-CM | POA: Diagnosis not present

## 2016-09-09 DIAGNOSIS — I10 Essential (primary) hypertension: Secondary | ICD-10-CM | POA: Insufficient documentation

## 2016-09-09 DIAGNOSIS — R1030 Lower abdominal pain, unspecified: Secondary | ICD-10-CM | POA: Diagnosis not present

## 2016-09-09 DIAGNOSIS — R339 Retention of urine, unspecified: Secondary | ICD-10-CM

## 2016-09-09 LAB — URINALYSIS, ROUTINE W REFLEX MICROSCOPIC
BILIRUBIN URINE: NEGATIVE
GLUCOSE, UA: NEGATIVE mg/dL
KETONES UR: NEGATIVE mg/dL
LEUKOCYTES UA: NEGATIVE
NITRITE: NEGATIVE
PH: 6 (ref 5.0–8.0)
Protein, ur: NEGATIVE mg/dL
SQUAMOUS EPITHELIAL / LPF: NONE SEEN
Specific Gravity, Urine: 1.006 (ref 1.005–1.030)

## 2016-09-09 NOTE — ED Provider Notes (Signed)
[facility]-EMERGENCY DEPT Provider Note   CSN: [id number] Arrival date & time: 09/09/16  8119     History   Chief Complaint Chief Complaint  Patient presents with  . Urinary Retention    HPI [name] [name] is a 83 y.o. male.  Second visit to the emergency department in the past week for urinary retention. His similar episode this past Monday. The catheter came out of Friday. He has been unable to urinate since that time. No fever, sweats, chills, dysuria.  He sees a Emergency planning/management officer Dr Hillis Range.  Severity of symptoms is moderate.      Past Medical History:  Diagnosis Date  . Acute MI (HCC)   . Coronary artery disease   . Diverticulosis 06-12-2003   Colonoscopy  . Hyperlipidemia   . Hypertension   . Kidney stones   . MVP (mitral valve prolapse)   . Undiagnosed cardiac murmurs     Patient Active Problem List   Diagnosis Date Noted  . Carotid stenosis 07/28/2015  . Aortic stenosis, mild 06/05/2011  . Special screening for malignant neoplasms, colon 09/21/2010  . Irregular heart rate 09/21/2010  . S/P mitral valve repair 07/07/2010  . Coronary artery disease 07/07/2010  . DYSPNEA 01/13/2009  . MITRAL VALVE PROLAPSE, NON-RHEUMATIC 07/16/2008  . HYPERCHOLESTEROLEMIA  IIA 03/11/2008  . HYPERLIPIDEMIA-MIXED 03/11/2008  . CAD, ARTERY BYPASS GRAFT 03/11/2008    Past Surgical History:  Procedure Laterality Date  . CORONARY ARTERY BYPASS GRAFT     2/09  . MITRAL VALVE REPAIR     2/09       Home Medications    Prior to Admission medications   Medication Sig Start Date End Date Taking? Authorizing Provider  aspirin EC 81 MG tablet Take 81 mg by mouth daily.   Yes [provider]  clopidogrel (PLAVIX) 75 MG tablet TAKE 1 TABLET ONCE DAILY. 09/04/16  Yes Laurey Morale, MD  furosemide (LASIX) 20 MG tablet TAKE 1 TABLET DAILY. 08/11/15  Yes Laurey Morale, MD  metoprolol succinate (TOPROL-XL) 25 MG 24 hr tablet Take 1 tablet (25 mg total) by mouth daily. Needs  office visit 08/09/16  Yes Laurey Morale, MD  Multiple Vitamin (MULTIVITAMIN) tablet Take 1 tablet by mouth daily.     Yes [provider]  potassium chloride (K-DUR) 10 MEQ tablet Take 1 tablet (10 mEq total) by mouth daily. 04/30/14  Yes Laurey Morale, MD  simvastatin (ZOCOR) 40 MG tablet TAKE 1 TABLET ONCE DAILY. 09/04/16  Yes Bensimhon, Bevelyn Buckles, MD  Tamsulosin HCl (FLOMAX) 0.4 MG CAPS Take 0.4 mg by mouth daily.   Yes [provider]    Family History Family History  Problem Relation Age of Onset  . Hypertension Other   . Colon cancer Neg Hx     Social History Social History  Substance Use Topics  . Smoking status: Never Smoker  . Smokeless tobacco: Never Used  . Alcohol use Yes     Comment: rare     Allergies   Patient has no known allergies.   Review of Systems Review of Systems   Physical Exam Updated Vital Signs There were no vitals taken for this visit.  Physical Exam  Constitutional: He is oriented to person, place, and time. He appears well-developed and well-nourished.  HENT:  Head: Normocephalic and atraumatic.  Eyes: Conjunctivae are normal.  Neck: Neck supple.  Cardiovascular: Normal rate and regular rhythm.   Pulmonary/Chest: Effort normal and breath sounds normal.  Abdominal:  Minimal suprapubic tenderness  Musculoskeletal: Normal range of motion.  Neurological: He is alert and oriented to person, place, and time.  Skin: Skin is warm and dry.  Psychiatric: He has a normal mood and affect. His behavior is normal.  Nursing note and vitals reviewed.    ED Treatments / Results  Labs (all labs ordered are listed, but only abnormal results are displayed) Labs Reviewed  URINALYSIS, ROUTINE W REFLEX MICROSCOPIC - Abnormal; Notable for the following:       Result Value   Color, Urine STRAW (*)    Hgb urine dipstick LARGE (*)    Bacteria, UA RARE (*)    All other components within normal limits    EKG  EKG  Interpretation None       Radiology No results found.  Procedures Procedures (including critical care time)  Medications Ordered in ED Medications - No data to display   Initial Impression / Assessment and Plan / ED Course  I have reviewed the triage vital signs and the nursing notes.  Pertinent labs & imaging results that were available during my care of the patient were reviewed by me and considered in my medical decision making (see chart for details).     Foley catheter was placed by RN. Good urinary flow. Urinalysis shows hemoglobin, but no nitrites or leukocytes. He understands to keep the catheter in place until his next visit with urologist.  Final Clinical Impressions(s) / ED Diagnoses   Final diagnoses:  Urinary retention    New Prescriptions New Prescriptions   No medications on file     Donnetta Hutching, MD 09/09/16 1015

## 2016-09-09 NOTE — ED Triage Notes (Signed)
Per EMS-states he had foley removed on Monday-foley placed for urinary retention related to prostate issues-states unable to void since 1800 last night-states decreased flow-no s/s's of UTI-states urinary pressure

## 2016-09-09 NOTE — Discharge Instructions (Signed)
Keep your Foley catheter in place until you see the urologist early next week.

## 2016-09-09 NOTE — ED Notes (Signed)
Called lab regarding UA results-should be result in 5 minutes

## 2016-09-15 DIAGNOSIS — R338 Other retention of urine: Secondary | ICD-10-CM | POA: Diagnosis not present

## 2016-09-16 ENCOUNTER — Other Ambulatory Visit (HOSPITAL_COMMUNITY): Payer: Self-pay | Admitting: Cardiology

## 2016-09-16 ENCOUNTER — Encounter (HOSPITAL_COMMUNITY): Payer: Self-pay | Admitting: *Deleted

## 2016-09-16 ENCOUNTER — Emergency Department (HOSPITAL_COMMUNITY)
Admission: EM | Admit: 2016-09-16 | Discharge: 2016-09-16 | Disposition: A | Discharge status: Home / Self Care | Payer: Medicare Other | Attending: Emergency Medicine | Admitting: Emergency Medicine

## 2016-09-16 DIAGNOSIS — I252 Old myocardial infarction: Secondary | ICD-10-CM | POA: Insufficient documentation

## 2016-09-16 DIAGNOSIS — R339 Retention of urine, unspecified: Secondary | ICD-10-CM

## 2016-09-16 DIAGNOSIS — T83198A Other mechanical complication of other urinary devices and implants, initial encounter: Secondary | ICD-10-CM | POA: Diagnosis not present

## 2016-09-16 DIAGNOSIS — T83498A Other mechanical complication of other prosthetic devices, implants and grafts of genital tract, initial encounter: Secondary | ICD-10-CM | POA: Diagnosis not present

## 2016-09-16 DIAGNOSIS — Z79899 Other long term (current) drug therapy: Secondary | ICD-10-CM | POA: Diagnosis not present

## 2016-09-16 DIAGNOSIS — I10 Essential (primary) hypertension: Secondary | ICD-10-CM | POA: Insufficient documentation

## 2016-09-16 DIAGNOSIS — I2510 Atherosclerotic heart disease of native coronary artery without angina pectoris: Secondary | ICD-10-CM | POA: Diagnosis not present

## 2016-09-16 DIAGNOSIS — R300 Dysuria: Secondary | ICD-10-CM | POA: Diagnosis not present

## 2016-09-16 HISTORY — DX: Retention of urine, unspecified: R33.9

## 2016-09-16 LAB — URINALYSIS, ROUTINE W REFLEX MICROSCOPIC
BILIRUBIN URINE: NEGATIVE
Bacteria, UA: NONE SEEN
GLUCOSE, UA: NEGATIVE mg/dL
KETONES UR: NEGATIVE mg/dL
LEUKOCYTES UA: NEGATIVE
Nitrite: NEGATIVE
PH: 5 (ref 5.0–8.0)
Protein, ur: NEGATIVE mg/dL
SPECIFIC GRAVITY, URINE: 1.009 (ref 1.005–1.030)
Squamous Epithelial / LPF: NONE SEEN

## 2016-09-16 NOTE — ED Provider Notes (Signed)
[facility]-EMERGENCY DEPT Provider Note   CSN: [id number] Arrival date & time: 09/16/16  1012     History   Chief Complaint Chief Complaint  Patient presents with  . Urinary Retention    HPI [name] [name] is a 83 y.o. male.Chief complaint is urinary retention. Patient has history of urinary retention in the past. This is his third visit to the emergency room in the past few weeks for urinary retention. He has been following up at his physician's office. He states was seen by the PA. His bladder was filled. The catheter was removed. He was immediately able to void. He has been several days. However since last night late he's been unable to urinate and complains of pain  HPI  Past Medical History:  Diagnosis Date  . Acute MI (HCC)   . Coronary artery disease   . Diverticulosis 06-12-2003   Colonoscopy  . Hyperlipidemia   . Hypertension   . Kidney stones   . MVP (mitral valve prolapse)   . Undiagnosed cardiac murmurs   . Urinary retention     Patient Active Problem List   Diagnosis Date Noted  . Carotid stenosis 07/28/2015  . Aortic stenosis, mild 06/05/2011  . Special screening for malignant neoplasms, colon 09/21/2010  . Irregular heart rate 09/21/2010  . S/P mitral valve repair 07/07/2010  . Coronary artery disease 07/07/2010  . DYSPNEA 01/13/2009  . MITRAL VALVE PROLAPSE, NON-RHEUMATIC 07/16/2008  . HYPERCHOLESTEROLEMIA  IIA 03/11/2008  . HYPERLIPIDEMIA-MIXED 03/11/2008  . CAD, ARTERY BYPASS GRAFT 03/11/2008    Past Surgical History:  Procedure Laterality Date  . CORONARY ARTERY BYPASS GRAFT     2/09  . MITRAL VALVE REPAIR     2/09       Home Medications    Prior to Admission medications   Medication Sig Start Date End Date Taking? Authorizing Provider  aspirin EC 81 MG tablet Take 81 mg by mouth daily.    [provider]  clopidogrel (PLAVIX) 75 MG tablet TAKE 1 TABLET ONCE DAILY. 09/04/16   Laurey Morale, MD  furosemide (LASIX) 20 MG tablet TAKE  1 TABLET DAILY. 08/11/15   Laurey Morale, MD  metoprolol succinate (TOPROL-XL) 25 MG 24 hr tablet TAKE 1 TABLET ONCE DAILY. 09/11/16   Laurey Morale, MD  Multiple Vitamin (MULTIVITAMIN) tablet Take 1 tablet by mouth daily.      [provider]  potassium chloride (K-DUR) 10 MEQ tablet Take 1 tablet (10 mEq total) by mouth daily. 04/30/14   Laurey Morale, MD  simvastatin (ZOCOR) 40 MG tablet TAKE 1 TABLET ONCE DAILY. 09/04/16   Bensimhon, Bevelyn Buckles, MD  Tamsulosin HCl (FLOMAX) 0.4 MG CAPS Take 0.4 mg by mouth daily.    [provider]    Family History Family History  Problem Relation Age of Onset  . Hypertension Other   . Colon cancer Neg Hx     Social History Social History  Substance Use Topics  . Smoking status: Never Smoker  . Smokeless tobacco: Never Used  . Alcohol use Yes     Comment: rare     Allergies   Patient has no known allergies.   Review of Systems Review of Systems  Constitutional: Negative for appetite change, chills, diaphoresis, fatigue and fever.  HENT: Negative for mouth sores, sore throat and trouble swallowing.   Eyes: Negative for visual disturbance.  Respiratory: Negative for cough, chest tightness, shortness of breath and wheezing.   Cardiovascular: Negative for chest  pain.  Gastrointestinal: Negative for abdominal distention, abdominal pain, diarrhea, nausea and vomiting.  Endocrine: Negative for polydipsia, polyphagia and polyuria.  Genitourinary: Positive for difficulty urinating. Negative for dysuria, frequency and hematuria.  Musculoskeletal: Negative for gait problem.  Skin: Negative for color change, pallor and rash.  Neurological: Negative for dizziness, syncope, light-headedness and headaches.  Hematological: Does not bruise/bleed easily.  Psychiatric/Behavioral: Negative for behavioral problems and confusion.     Physical Exam Updated Vital Signs BP 135/80 (BP Location: Right Arm)   Pulse 61   Temp 97.8 F  (36.6 C) (Oral)   Resp 16   Ht 5\' 8"  (1.727 m)   Wt 65.8 kg (145 lb)   SpO2 98%   BMI 22.05 kg/m   Physical Exam  Constitutional: He is oriented to person, place, and time. He appears well-developed and well-nourished. No distress.  Appears uncomfortable. Pacing.  HENT:  Head: Normocephalic.  Eyes: Pupils are equal, round, and reactive to light. Conjunctivae are normal. No scleral icterus.  Neck: Normal range of motion. Neck supple. No thyromegaly present.  Cardiovascular: Normal rate and regular rhythm.  Exam reveals no gallop and no friction rub.   No murmur heard. Pulmonary/Chest: Effort normal and breath sounds normal. No respiratory distress. He has no wheezes. He has no rales.  Abdominal: Soft. Bowel sounds are normal. He exhibits no distension. There is no tenderness. There is no rebound.  Suprapubic tenderness.  Musculoskeletal: Normal range of motion.  Neurological: He is alert and oriented to person, place, and time.  Skin: Skin is warm and dry. No rash noted.  Psychiatric: He has a normal mood and affect. His behavior is normal.     ED Treatments / Results  Labs (all labs ordered are listed, but only abnormal results are displayed) Labs Reviewed  URINALYSIS, ROUTINE W REFLEX MICROSCOPIC - Abnormal; Notable for the following:       Result Value   Hgb urine dipstick MODERATE (*)    All other components within normal limits    EKG  EKG Interpretation None       Radiology No results found.  Procedures Procedures (including critical care time)  Medications Ordered in ED Medications - No data to display   Initial Impression / Assessment and Plan / ED Course  I have reviewed the triage vital signs and the nursing notes.  Pertinent labs & imaging results that were available during my care of the patient were reviewed by me and considered in my medical decision making (see chart for details).     Foley catheter placed by nursing without difficulty.  Return 600 mL. Placed to a leg bag. Discharged home. Encouraged to follow-up with his urologist again and to maintain catheter until follow-up.  Final Clinical Impressions(s) / ED Diagnoses   Final diagnoses:  Urinary retention    New Prescriptions New Prescriptions   No medications on file     Rolland Porter, MD 09/16/16 1230

## 2016-09-16 NOTE — Discharge Instructions (Signed)
Continue Foley catheter until evaluated by Dr. Retta Diones

## 2016-09-16 NOTE — ED Triage Notes (Signed)
Pt seen by urology yesterday, cath removed, unable to void last night

## 2016-09-21 DIAGNOSIS — N400 Enlarged prostate without lower urinary tract symptoms: Secondary | ICD-10-CM | POA: Diagnosis not present

## 2016-09-22 DIAGNOSIS — N400 Enlarged prostate without lower urinary tract symptoms: Secondary | ICD-10-CM | POA: Diagnosis not present

## 2016-10-02 ENCOUNTER — Encounter (HOSPITAL_COMMUNITY): Payer: Self-pay | Admitting: Cardiology

## 2016-10-02 ENCOUNTER — Ambulatory Visit (HOSPITAL_COMMUNITY)
Admission: RE | Admit: 2016-10-02 | Discharge: 2016-10-02 | Disposition: A | Discharge status: Home / Self Care | Payer: Medicare Other | Source: Ambulatory Visit | Attending: Cardiology | Admitting: Cardiology

## 2016-10-02 VITALS — BP 146/72 | HR 56 | Wt 134.2 lb

## 2016-10-02 DIAGNOSIS — N400 Enlarged prostate without lower urinary tract symptoms: Secondary | ICD-10-CM | POA: Diagnosis not present

## 2016-10-02 DIAGNOSIS — E785 Hyperlipidemia, unspecified: Secondary | ICD-10-CM | POA: Diagnosis not present

## 2016-10-02 DIAGNOSIS — I341 Nonrheumatic mitral (valve) prolapse: Secondary | ICD-10-CM | POA: Diagnosis not present

## 2016-10-02 DIAGNOSIS — Z951 Presence of aortocoronary bypass graft: Secondary | ICD-10-CM | POA: Diagnosis not present

## 2016-10-02 DIAGNOSIS — Z9889 Other specified postprocedural states: Secondary | ICD-10-CM

## 2016-10-02 DIAGNOSIS — I25810 Atherosclerosis of coronary artery bypass graft(s) without angina pectoris: Secondary | ICD-10-CM | POA: Diagnosis not present

## 2016-10-02 DIAGNOSIS — I2510 Atherosclerotic heart disease of native coronary artery without angina pectoris: Secondary | ICD-10-CM | POA: Diagnosis not present

## 2016-10-02 DIAGNOSIS — Z8674 Personal history of sudden cardiac arrest: Secondary | ICD-10-CM | POA: Diagnosis not present

## 2016-10-02 DIAGNOSIS — Z7982 Long term (current) use of aspirin: Secondary | ICD-10-CM | POA: Insufficient documentation

## 2016-10-02 DIAGNOSIS — K5790 Diverticulosis of intestine, part unspecified, without perforation or abscess without bleeding: Secondary | ICD-10-CM | POA: Insufficient documentation

## 2016-10-02 DIAGNOSIS — I350 Nonrheumatic aortic (valve) stenosis: Secondary | ICD-10-CM | POA: Diagnosis not present

## 2016-10-02 DIAGNOSIS — E7849 Other hyperlipidemia: Secondary | ICD-10-CM

## 2016-10-02 DIAGNOSIS — E784 Other hyperlipidemia: Secondary | ICD-10-CM

## 2016-10-02 DIAGNOSIS — I252 Old myocardial infarction: Secondary | ICD-10-CM | POA: Diagnosis not present

## 2016-10-02 DIAGNOSIS — Z7902 Long term (current) use of antithrombotics/antiplatelets: Secondary | ICD-10-CM | POA: Diagnosis not present

## 2016-10-02 DIAGNOSIS — I10 Essential (primary) hypertension: Secondary | ICD-10-CM | POA: Diagnosis not present

## 2016-10-02 LAB — LIPID PANEL
CHOL/HDL RATIO: 2.7 ratio
Cholesterol: 110 mg/dL (ref 0–200)
HDL: 41 mg/dL (ref >40)
LDL Cholesterol: 58 mg/dL (ref 0–99)
Triglycerides: 57 mg/dL (ref <150)
VLDL: 11 mg/dL (ref 0–40)

## 2016-10-02 LAB — BASIC METABOLIC PANEL
Anion gap: 6 (ref 5–15)
BUN: 19 mg/dL (ref 6–20)
CALCIUM: 9.2 mg/dL (ref 8.9–10.3)
CO2: 26 mmol/L (ref 22–32)
CREATININE: 1.07 mg/dL (ref 0.61–1.24)
Chloride: 107 mmol/L (ref 101–111)
GFR calc Af Amer: >60 mL/min (ref >60)
GLUCOSE: 111 mg/dL — AB (ref 65–99)
Potassium: 4.3 mmol/L (ref 3.5–5.1)
Sodium: 139 mmol/L (ref 135–145)

## 2016-10-02 LAB — CBC
HCT: 34.9 % — ABNORMAL LOW (ref 39.0–52.0)
Hemoglobin: 11.2 g/dL — ABNORMAL LOW (ref 13.0–17.0)
MCH: 30.4 pg (ref 26.0–34.0)
MCHC: 32.1 g/dL (ref 30.0–36.0)
MCV: 94.6 fL (ref 78.0–100.0)
PLATELETS: 329 10*3/uL (ref 150–400)
RBC: 3.69 MIL/uL — ABNORMAL LOW (ref 4.22–5.81)
RDW: 12.7 % (ref 11.5–15.5)
WBC: 5.3 10*3/uL (ref 4.0–10.5)

## 2016-10-02 MED ORDER — LISINOPRIL 10 MG PO TABS: 10.0000 mg | ORAL_TABLET | Freq: Every day | ORAL | 11 refills | Status: DC

## 2016-10-02 NOTE — Progress Notes (Signed)
Patient ID: [name] [name], male   DOB: [birth year], 83 y.o.   MRN: [id number] PCP: Dr. Chilton Si Cardiology: Dr. Shirlee Latch  83 yo with history of CAD s/p CABG and mitral regurgitation s/p mitral valve repair presents for cardiology followup.    He continues to have exertional dyspnea.  This has been a long-term pattern, and he notes it normally when he first starts exercising strenuously.  He jogs with his dog and is short of breath initially but it passes.  No chest pain.  No orthopnea/PND.  No palpitations.    Labs (2/14): K 4, creatinine 1.1 Labs (1/15): K 4, creatinine 1.1, LDL 64, HDL 57, pro-BNP 112 Labs (9/35): K 4.1, creatinine 1.03, LDL 66, HDL 53 Labs (5/17): LDL 57, HDL 71 Labs (7/17): K 4.2, creatinine 1.12  ECG (personally reviewed): NSR, iRBBB, PVC  PMH: 1. CAD: Acute MI in 2006 with ventricular fibrillation arrest.  Patient had 1st generation DES to LCx.  Patient then had CABG 2009 with LIMA-OM and SVG-PDA.  Plan has been to continue DAPT long-term because of 1st generation DES.  2. Nephrolithiasis 3. HTN 4. Hyperlipidemia 5. Diverticulosis 6. Aortic stenosis: Mild on 3/16 echo. 7. Mitral valve prolapse with severe MR: MV repair in 2009.  Echo (2/14) with EF 50-55%, inferior HK, mild AS with mean gradient 12 mmHg, s/p MV repair with mild MR and no significant MS (mean gradient 3 mmHg), mild RV dilation, PA systolic pressure 37 mmHg.  Echo (3/16) with EF 50-55%, mild focal basal septal hypertrophy, mild aortic stenosis mean gradient 14 mmHg, s/p MV repair with mild MR and no MS, PASP 38 mmHg.  8. Thyroid nodule: Biopsy with benign follicular nodule in 2018. 9. 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   FH: HTN  ROS: All systems reviewed and negative except as per HPI.   Current Outpatient Prescriptions  Medication Sig Dispense Refill  . aspirin EC 81 MG tablet Take 81 mg by mouth daily.    . clopidogrel (PLAVIX) 75 MG tablet TAKE 1 TABLET  ONCE DAILY. 30 tablet 1  . furosemide (LASIX) 20 MG tablet TAKE 1 TABLET DAILY. 90 tablet 3  . Multiple Vitamin (MULTIVITAMIN) tablet Take 1 tablet by mouth daily.      . potassium chloride (K-DUR) 10 MEQ tablet Take 1 tablet (10 mEq total) by mouth daily. 90 tablet 3  . simvastatin (ZOCOR) 40 MG tablet TAKE 1 TABLET ONCE DAILY. 30 tablet 1  . Tamsulosin HCl (FLOMAX) 0.4 MG CAPS Take 0.4 mg by mouth daily.    Marland Kitchen lisinopril (PRINIVIL,ZESTRIL) 10 MG tablet Take 1 tablet (10 mg total) by mouth daily. 30 tablet 11   No current facility-administered medications for this encounter.     BP (!) 146/72   Pulse (!) 56   Wt 134 lb 4 oz (60.9 kg)   SpO2 100%   BMI 20.41 kg/m  General: NAD Neck: No JVD, no thyromegaly or thyroid nodule.  Lungs: Clear to auscultation bilaterally with normal respiratory effort. CV: Nondisplaced PMI.  Heart regular S1/S2, no S3/S4, 2/6 SEM with clear S2.  No peripheral edema.  No carotid bruit.  Normal pedal pulses.  Abdomen: Soft, nontender, no hepatosplenomegaly, no distention.  Skin: Intact without lesions or rashes.  Neurologic: Alert and oriented x 3.  Psych: Normal affect. Extremities: No clubbing or cyanosis.  HEENT: Normal.   Assessment/Plan: 1. CAD: S/p 1st generation DES to LCx and later CABG.  No chest pain.  -  Patient has been on ASA and Plavix long-term and plan to continue this regimen.   - Continue statin.  2. S/p MV repair: MV stable on 3/16 echo.   3. Aortic stenosis: Mild on 3/16 echo. I will arrange repeat echo.   4. Dyspnea:  Mild stable dyspnea with overall good exercise tolerance.   - He takes a low dose of Lasix but this really has not seemed to make much difference.  BMET today.  - Notes dyspnea when he starts exercise, I wonder if this could be due to decreased heart rate excursion on Toprol XL.  I will have him stop Toprol XL and start lisinopril 10 mg daily for HTN.  5. Hyperlipidemia: Continue statin. Check lipids today.  6. HTN: As  above, plan to stop Toprol XL and start lisinopril 10 mg daily.    Marca Ancona 10/02/2016

## 2016-10-02 NOTE — Patient Instructions (Signed)
Stop Metoprolol  Start Lisinopril 10 mg daily  Labs today  Your physician has requested that you have an echocardiogram. Echocardiography is a painless test that uses sound waves to create images of your heart. It provides your doctor with information about the size and shape of your heart and how well your heart's chambers and valves are working. This procedure takes approximately one hour. There are no restrictions for this procedure.  We will contact you in 1 year to schedule your next appointment.

## 2016-10-03 ENCOUNTER — Other Ambulatory Visit (HOSPITAL_COMMUNITY): Payer: Self-pay | Admitting: Cardiology

## 2016-10-18 DIAGNOSIS — N401 Enlarged prostate with lower urinary tract symptoms: Secondary | ICD-10-CM | POA: Diagnosis not present

## 2016-10-18 DIAGNOSIS — R338 Other retention of urine: Secondary | ICD-10-CM | POA: Diagnosis not present

## 2016-10-20 DIAGNOSIS — N401 Enlarged prostate with lower urinary tract symptoms: Secondary | ICD-10-CM | POA: Diagnosis not present

## 2016-10-20 DIAGNOSIS — Z0000 Encounter for general adult medical examination without abnormal findings: Secondary | ICD-10-CM | POA: Diagnosis not present

## 2016-10-20 DIAGNOSIS — I2510 Atherosclerotic heart disease of native coronary artery without angina pectoris: Secondary | ICD-10-CM | POA: Diagnosis not present

## 2016-10-20 DIAGNOSIS — I10 Essential (primary) hypertension: Secondary | ICD-10-CM | POA: Diagnosis not present

## 2016-10-31 ENCOUNTER — Other Ambulatory Visit (HOSPITAL_COMMUNITY): Payer: Self-pay | Admitting: Cardiology

## 2016-11-01 ENCOUNTER — Other Ambulatory Visit: Payer: Self-pay

## 2016-11-01 ENCOUNTER — Ambulatory Visit (HOSPITAL_COMMUNITY): Payer: Medicare Other | Attending: Cardiology

## 2016-11-01 DIAGNOSIS — I080 Rheumatic disorders of both mitral and aortic valves: Secondary | ICD-10-CM | POA: Insufficient documentation

## 2016-11-01 DIAGNOSIS — I350 Nonrheumatic aortic (valve) stenosis: Secondary | ICD-10-CM

## 2016-11-01 DIAGNOSIS — I272 Pulmonary hypertension, unspecified: Secondary | ICD-10-CM | POA: Insufficient documentation

## 2016-11-01 DIAGNOSIS — I219 Acute myocardial infarction, unspecified: Secondary | ICD-10-CM | POA: Insufficient documentation

## 2016-11-06 ENCOUNTER — Other Ambulatory Visit: Payer: Self-pay | Admitting: Internal Medicine

## 2016-12-17 ENCOUNTER — Other Ambulatory Visit (HOSPITAL_COMMUNITY): Payer: Self-pay | Admitting: Cardiology

## 2017-01-24 DIAGNOSIS — H903 Sensorineural hearing loss, bilateral: Secondary | ICD-10-CM | POA: Diagnosis not present

## 2017-01-24 DIAGNOSIS — H6123 Impacted cerumen, bilateral: Secondary | ICD-10-CM | POA: Diagnosis not present

## 2017-03-21 ENCOUNTER — Other Ambulatory Visit (HOSPITAL_COMMUNITY): Payer: Self-pay | Admitting: Cardiology

## 2017-04-09 DIAGNOSIS — R351 Nocturia: Secondary | ICD-10-CM | POA: Diagnosis not present

## 2017-04-09 DIAGNOSIS — N401 Enlarged prostate with lower urinary tract symptoms: Secondary | ICD-10-CM | POA: Diagnosis not present

## 2017-04-11 DIAGNOSIS — H52223 Regular astigmatism, bilateral: Secondary | ICD-10-CM | POA: Diagnosis not present

## 2017-04-20 DIAGNOSIS — D559 Anemia due to enzyme disorder, unspecified: Secondary | ICD-10-CM | POA: Diagnosis not present

## 2017-04-20 DIAGNOSIS — I2510 Atherosclerotic heart disease of native coronary artery without angina pectoris: Secondary | ICD-10-CM | POA: Diagnosis not present

## 2017-04-20 DIAGNOSIS — D649 Anemia, unspecified: Secondary | ICD-10-CM | POA: Diagnosis not present

## 2017-04-23 DIAGNOSIS — M25562 Pain in left knee: Secondary | ICD-10-CM | POA: Diagnosis not present

## 2017-04-23 DIAGNOSIS — M1712 Unilateral primary osteoarthritis, left knee: Secondary | ICD-10-CM | POA: Diagnosis not present

## 2017-05-25 ENCOUNTER — Other Ambulatory Visit (HOSPITAL_COMMUNITY): Payer: Self-pay | Admitting: *Deleted

## 2017-05-25 MED ORDER — LISINOPRIL 10 MG PO TABS: 10.0000 mg | ORAL_TABLET | Freq: Every day | ORAL | 3 refills | Status: DC

## 2017-05-28 DIAGNOSIS — M1712 Unilateral primary osteoarthritis, left knee: Secondary | ICD-10-CM | POA: Diagnosis not present

## 2017-06-04 ENCOUNTER — Other Ambulatory Visit (HOSPITAL_COMMUNITY): Payer: Self-pay | Admitting: *Deleted

## 2017-06-04 MED ORDER — SIMVASTATIN 40 MG PO TABS: 40.0000 mg | ORAL_TABLET | Freq: Every day | ORAL | 3 refills | Status: DC

## 2017-06-13 DIAGNOSIS — M1712 Unilateral primary osteoarthritis, left knee: Secondary | ICD-10-CM | POA: Diagnosis not present

## 2017-06-25 ENCOUNTER — Other Ambulatory Visit (HOSPITAL_COMMUNITY): Payer: Self-pay | Admitting: Cardiology

## 2017-09-19 DIAGNOSIS — L905 Scar conditions and fibrosis of skin: Secondary | ICD-10-CM | POA: Diagnosis not present

## 2017-09-19 DIAGNOSIS — D225 Melanocytic nevi of trunk: Secondary | ICD-10-CM | POA: Diagnosis not present

## 2017-09-19 DIAGNOSIS — Z85828 Personal history of other malignant neoplasm of skin: Secondary | ICD-10-CM | POA: Diagnosis not present

## 2017-09-19 DIAGNOSIS — D485 Neoplasm of uncertain behavior of skin: Secondary | ICD-10-CM | POA: Diagnosis not present

## 2017-09-19 DIAGNOSIS — L821 Other seborrheic keratosis: Secondary | ICD-10-CM | POA: Diagnosis not present

## 2017-09-27 ENCOUNTER — Other Ambulatory Visit (HOSPITAL_COMMUNITY): Payer: Self-pay | Admitting: Cardiology

## 2017-10-03 DIAGNOSIS — M1712 Unilateral primary osteoarthritis, left knee: Secondary | ICD-10-CM | POA: Diagnosis not present

## 2017-10-24 ENCOUNTER — Other Ambulatory Visit: Payer: Self-pay | Admitting: Internal Medicine

## 2017-10-24 DIAGNOSIS — R030 Elevated blood-pressure reading, without diagnosis of hypertension: Secondary | ICD-10-CM | POA: Diagnosis not present

## 2017-10-24 DIAGNOSIS — N400 Enlarged prostate without lower urinary tract symptoms: Secondary | ICD-10-CM | POA: Diagnosis not present

## 2017-10-24 DIAGNOSIS — C4490 Unspecified malignant neoplasm of skin, unspecified: Secondary | ICD-10-CM | POA: Diagnosis not present

## 2017-10-24 DIAGNOSIS — I2510 Atherosclerotic heart disease of native coronary artery without angina pectoris: Secondary | ICD-10-CM | POA: Diagnosis not present

## 2017-11-09 DIAGNOSIS — Z23 Encounter for immunization: Secondary | ICD-10-CM | POA: Diagnosis not present

## 2017-11-14 ENCOUNTER — Other Ambulatory Visit (HOSPITAL_COMMUNITY): Payer: Self-pay | Admitting: Cardiology

## 2017-11-22 DIAGNOSIS — S0512XA Contusion of eyeball and orbital tissues, left eye, initial encounter: Secondary | ICD-10-CM | POA: Diagnosis not present

## 2017-12-19 DIAGNOSIS — M1712 Unilateral primary osteoarthritis, left knee: Secondary | ICD-10-CM | POA: Diagnosis not present

## 2017-12-26 ENCOUNTER — Other Ambulatory Visit (HOSPITAL_COMMUNITY): Payer: Self-pay | Admitting: Cardiology

## 2017-12-26 DIAGNOSIS — M1712 Unilateral primary osteoarthritis, left knee: Secondary | ICD-10-CM | POA: Diagnosis not present

## 2017-12-31 ENCOUNTER — Other Ambulatory Visit (HOSPITAL_COMMUNITY): Payer: Self-pay | Admitting: Cardiology

## 2017-12-31 ENCOUNTER — Other Ambulatory Visit: Payer: Self-pay | Admitting: Internal Medicine

## 2018-01-02 ENCOUNTER — Other Ambulatory Visit (HOSPITAL_COMMUNITY): Payer: Self-pay

## 2018-01-02 MED ORDER — CLOPIDOGREL BISULFATE 75 MG PO TABS: 75.0000 mg | ORAL_TABLET | Freq: Every day | ORAL | 0 refills | Status: DC

## 2018-01-03 DIAGNOSIS — M1712 Unilateral primary osteoarthritis, left knee: Secondary | ICD-10-CM | POA: Diagnosis not present

## 2018-01-04 ENCOUNTER — Other Ambulatory Visit (HOSPITAL_COMMUNITY): Payer: Self-pay | Admitting: Cardiology

## 2018-01-07 ENCOUNTER — Telehealth (HOSPITAL_COMMUNITY): Payer: Self-pay | Admitting: Vascular Surgery

## 2018-01-07 ENCOUNTER — Other Ambulatory Visit (HOSPITAL_COMMUNITY): Payer: Self-pay

## 2018-01-07 MED ORDER — FUROSEMIDE 20 MG PO TABS: 20.0000 mg | ORAL_TABLET | Freq: Every day | ORAL | 0 refills | Status: DC

## 2018-01-07 NOTE — Telephone Encounter (Signed)
Pt needs refill on medication, he was told he needed to make appt w/ [facility] before he can get medication refill, pt made next ava appt w/ [facility] 03/21/18, can you please give pt medication refills.. Please advise

## 2018-01-07 NOTE — Telephone Encounter (Signed)
Done. Thank you.

## 2018-02-13 DIAGNOSIS — H6123 Impacted cerumen, bilateral: Secondary | ICD-10-CM | POA: Diagnosis not present

## 2018-02-15 DIAGNOSIS — M1712 Unilateral primary osteoarthritis, left knee: Secondary | ICD-10-CM | POA: Diagnosis not present

## 2018-03-12 DIAGNOSIS — S61412A Laceration without foreign body of left hand, initial encounter: Secondary | ICD-10-CM | POA: Diagnosis not present

## 2018-03-12 DIAGNOSIS — M1990 Unspecified osteoarthritis, unspecified site: Secondary | ICD-10-CM | POA: Diagnosis not present

## 2018-03-19 DIAGNOSIS — S61412A Laceration without foreign body of left hand, initial encounter: Secondary | ICD-10-CM | POA: Diagnosis not present

## 2018-03-20 DIAGNOSIS — M1712 Unilateral primary osteoarthritis, left knee: Secondary | ICD-10-CM | POA: Diagnosis not present

## 2018-03-21 ENCOUNTER — Ambulatory Visit (HOSPITAL_COMMUNITY)
Admission: RE | Admit: 2018-03-21 | Discharge: 2018-03-21 | Disposition: A | Discharge status: Home / Self Care | Payer: Medicare Other | Source: Ambulatory Visit | Attending: Cardiology | Admitting: Cardiology

## 2018-03-21 ENCOUNTER — Encounter (HOSPITAL_COMMUNITY): Payer: Self-pay | Admitting: Cardiology

## 2018-03-21 VITALS — BP 110/58 | HR 56 | Wt 133.2 lb

## 2018-03-21 DIAGNOSIS — I491 Atrial premature depolarization: Secondary | ICD-10-CM | POA: Diagnosis not present

## 2018-03-21 DIAGNOSIS — Z8674 Personal history of sudden cardiac arrest: Secondary | ICD-10-CM | POA: Insufficient documentation

## 2018-03-21 DIAGNOSIS — Z7982 Long term (current) use of aspirin: Secondary | ICD-10-CM | POA: Insufficient documentation

## 2018-03-21 DIAGNOSIS — N400 Enlarged prostate without lower urinary tract symptoms: Secondary | ICD-10-CM | POA: Diagnosis not present

## 2018-03-21 DIAGNOSIS — I10 Essential (primary) hypertension: Secondary | ICD-10-CM | POA: Diagnosis not present

## 2018-03-21 DIAGNOSIS — I2510 Atherosclerotic heart disease of native coronary artery without angina pectoris: Secondary | ICD-10-CM

## 2018-03-21 DIAGNOSIS — I350 Nonrheumatic aortic (valve) stenosis: Secondary | ICD-10-CM

## 2018-03-21 DIAGNOSIS — Z951 Presence of aortocoronary bypass graft: Secondary | ICD-10-CM | POA: Insufficient documentation

## 2018-03-21 DIAGNOSIS — I4510 Unspecified right bundle-branch block: Secondary | ICD-10-CM | POA: Diagnosis not present

## 2018-03-21 DIAGNOSIS — Z9889 Other specified postprocedural states: Secondary | ICD-10-CM

## 2018-03-21 DIAGNOSIS — E785 Hyperlipidemia, unspecified: Secondary | ICD-10-CM | POA: Diagnosis not present

## 2018-03-21 DIAGNOSIS — I252 Old myocardial infarction: Secondary | ICD-10-CM | POA: Diagnosis not present

## 2018-03-21 DIAGNOSIS — Z8249 Family history of ischemic heart disease and other diseases of the circulatory system: Secondary | ICD-10-CM | POA: Diagnosis not present

## 2018-03-21 DIAGNOSIS — Z79899 Other long term (current) drug therapy: Secondary | ICD-10-CM | POA: Insufficient documentation

## 2018-03-21 DIAGNOSIS — I080 Rheumatic disorders of both mitral and aortic valves: Secondary | ICD-10-CM | POA: Insufficient documentation

## 2018-03-21 DIAGNOSIS — Z7902 Long term (current) use of antithrombotics/antiplatelets: Secondary | ICD-10-CM | POA: Insufficient documentation

## 2018-03-21 DIAGNOSIS — Z01818 Encounter for other preprocedural examination: Secondary | ICD-10-CM | POA: Insufficient documentation

## 2018-03-21 LAB — BASIC METABOLIC PANEL
ANION GAP: 7 (ref 5–15)
BUN: 27 mg/dL — AB (ref 8–23)
CHLORIDE: 109 mmol/L (ref 98–111)
CO2: 23 mmol/L (ref 22–32)
Calcium: 8.9 mg/dL (ref 8.9–10.3)
Creatinine, Ser: 1.28 mg/dL — ABNORMAL HIGH (ref 0.61–1.24)
GFR calc non Af Amer: 50 mL/min — ABNORMAL LOW (ref >60)
GFR, EST AFRICAN AMERICAN: 58 mL/min — AB (ref >60)
Glucose, Bld: 97 mg/dL (ref 70–99)
POTASSIUM: 4.3 mmol/L (ref 3.5–5.1)
SODIUM: 139 mmol/L (ref 135–145)

## 2018-03-21 LAB — LIPID PANEL
CHOL/HDL RATIO: 2.3 ratio
CHOLESTEROL: 139 mg/dL (ref 0–200)
HDL: 61 mg/dL (ref >40)
LDL Cholesterol: 66 mg/dL (ref 0–99)
TRIGLYCERIDES: 61 mg/dL (ref <150)
VLDL: 12 mg/dL (ref 0–40)

## 2018-03-21 NOTE — Patient Instructions (Signed)
Labs done today. We will contact you for any abnormal labs.  Your physician has requested that you have an echocardiogram. Echocardiography is a painless test that uses sound waves to create images of your heart. It provides your doctor with information about the size and shape of your heart and how well your heart's chambers and valves are working. This procedure takes approximately one hour. There are no restrictions for this procedure.  Follow up with Dr. Shirlee Latch in 1 year. We will put you on the recall list and call you when we have the schedule for 1 year from now.

## 2018-03-21 NOTE — Progress Notes (Signed)
Patient ID: [name] [name], male   DOB: 1929/02/14, 85 y.o.   MRN: [id number] PCP: Dr. Chilton Si Cardiology: Dr. Shirlee Latch  85 y.o.with history of CAD s/p CABG and mitral regurgitation s/p mitral valve repair presents for cardiology followup.    Main complaint currently is left knee pain.  This has been considerably worse since he fell on his knee while jogging with his dog several months ago.  He needs left TKR at this point.  He cannot run due to the pain.  He denies exertional dyspnea.  He can climb a flight of stairs with no problems except for knee pain.  No chest pain.  No palpitations/lightheadedness.    Labs (2/14): K 4, creatinine 1.1 Labs (1/15): K 4, creatinine 1.1, LDL 64, HDL 57, pro-BNP 112 Labs (2/06): K 4.1, creatinine 1.03, LDL 66, HDL 53 Labs (5/17): LDL 57, HDL 71 Labs (7/17): K 4.2, creatinine 1.12 Labs (8/18): K 4.3, creatinine 1.07, LDL 58  ECG (personally reviewed): NSR, iRBBB, PVC  PMH: 1. CAD: Acute MI in 2006 with ventricular fibrillation arrest.  Patient had 1st generation DES to LCx.  Patient then had CABG 2009 with LIMA-OM and SVG-PDA.  Plan has been to continue DAPT long-term because of 1st generation DES.  2. Nephrolithiasis 3. HTN 4. Hyperlipidemia 5. Diverticulosis 6. Aortic stenosis: Mild on 3/16 echo. 7. Mitral valve prolapse with severe MR: MV repair in 2009.  Echo (2/14) with EF 50-55%, inferior HK, mild AS with mean gradient 12 mmHg, s/p MV repair with mild MR and no significant MS (mean gradient 3 mmHg), mild RV dilation, PA systolic pressure 37 mmHg.  Echo (3/16) with EF 50-55%, mild focal basal septal hypertrophy, mild aortic stenosis mean gradient 14 mmHg, s/p MV repair with mild MR and no MS, PASP 38 mmHg.  8. Thyroid nodule: Biopsy with benign follicular nodule in 2018. 9. 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   FH: HTN  ROS: All systems reviewed and negative except as per HPI.   Current Outpatient  Medications  Medication Sig Dispense Refill  . aspirin EC 81 MG tablet Take 81 mg by mouth daily.    . clopidogrel (PLAVIX) 75 MG tablet Take 1 tablet (75 mg total) by mouth daily. NEED APPOINTMENT 90 tablet 0  . furosemide (LASIX) 20 MG tablet Take 1 tablet (20 mg total) by mouth daily. 90 tablet 0  . lisinopril (PRINIVIL,ZESTRIL) 10 MG tablet Take 1 tablet (10 mg total) by mouth daily. 90 tablet 3  . Multiple Vitamin (MULTIVITAMIN) tablet Take 1 tablet by mouth daily.      . simvastatin (ZOCOR) 40 MG tablet Take 1 tablet (40 mg total) by mouth daily. 90 tablet 3  . Tamsulosin HCl (FLOMAX) 0.4 MG CAPS Take 0.4 mg by mouth 2 (two) times daily.      No current facility-administered medications for this encounter.     BP (!) 110/58   Pulse (!) 56   Wt 60.4 kg (133 lb 3.2 oz)   SpO2 98%   BMI 20.25 kg/m  General: NAD Neck: No JVD, no thyromegaly or thyroid nodule.  Lungs: Clear to auscultation bilaterally with normal respiratory effort. CV: Nondisplaced PMI.  Heart regular S1/S2, no S3/S4, 2/6 early SEM RUSB.  No peripheral edema.  No carotid bruit.  Normal pedal pulses.  Abdomen: Soft, nontender, no hepatosplenomegaly, no distention.  Skin: Intact without lesions or rashes.  Neurologic: Alert and oriented x 3.  Psych: Normal affect.  Extremities: No clubbing or cyanosis.  HEENT: Normal.   Assessment/Plan: 1. CAD: S/p 1st generation DES to LCx and later CABG.  No chest pain.  - Patient has been on ASA and Plavix long-term and plan to continue this regimen.   - Continue statin.  2. S/p MV repair: MV stable on 3/16 echo.   3. Aortic stenosis: Mild on 3/16 echo. I will arrange for a repeat echo to follow the aortic and mitral valves.    4. Hyperlipidemia: Continue statin. Check lipids today.  5. HTN: BP controlled.  6. Pre-operative evaluation: Patient is of acceptable risk to undergo left TKR. He will need to hold Plavix 5 days prior to procedure and restart afterwards.   Marca Ancona 03/21/2018

## 2018-03-22 ENCOUNTER — Telehealth (HOSPITAL_COMMUNITY): Payer: Self-pay

## 2018-03-22 NOTE — Telephone Encounter (Signed)
Pre op clearance and EKG, labs and office note faxed to [facility] at [phone], attn Haskell Riling, confirmation received.

## 2018-04-12 DIAGNOSIS — N401 Enlarged prostate with lower urinary tract symptoms: Secondary | ICD-10-CM | POA: Diagnosis not present

## 2018-04-12 DIAGNOSIS — R351 Nocturia: Secondary | ICD-10-CM | POA: Diagnosis not present

## 2018-04-24 DIAGNOSIS — H52223 Regular astigmatism, bilateral: Secondary | ICD-10-CM | POA: Diagnosis not present

## 2018-04-30 ENCOUNTER — Other Ambulatory Visit: Payer: Self-pay | Admitting: Internal Medicine

## 2018-05-17 ENCOUNTER — Telehealth (HOSPITAL_COMMUNITY): Payer: Self-pay

## 2018-05-17 NOTE — Telephone Encounter (Signed)
Attempted to contact patient in order to cancel his upcoming echo. Left voicemail on home phone number.

## 2018-05-23 ENCOUNTER — Ambulatory Visit (HOSPITAL_COMMUNITY): Payer: Medicare Other

## 2018-05-29 ENCOUNTER — Other Ambulatory Visit (HOSPITAL_COMMUNITY): Payer: Self-pay | Admitting: Cardiology

## 2018-05-31 ENCOUNTER — Inpatient Hospital Stay: Admit: 2018-05-31 | Payer: Medicare Other | Admitting: Specialist

## 2018-05-31 SURGERY — ARTHROPLASTY, KNEE, TOTAL
Anesthesia: Spinal | Laterality: Left

## 2018-06-08 ENCOUNTER — Other Ambulatory Visit (HOSPITAL_COMMUNITY): Payer: Self-pay | Admitting: Cardiology

## 2018-06-17 ENCOUNTER — Other Ambulatory Visit (HOSPITAL_COMMUNITY): Payer: Self-pay | Admitting: Cardiology

## 2018-07-18 ENCOUNTER — Ambulatory Visit (HOSPITAL_COMMUNITY)
Admission: RE | Admit: 2018-07-18 | Discharge: 2018-07-18 | Disposition: A | Discharge status: Home / Self Care | Payer: Medicare Other | Source: Ambulatory Visit | Attending: Cardiology | Admitting: Cardiology

## 2018-07-18 ENCOUNTER — Other Ambulatory Visit: Payer: Self-pay

## 2018-07-18 DIAGNOSIS — I350 Nonrheumatic aortic (valve) stenosis: Secondary | ICD-10-CM | POA: Diagnosis not present

## 2018-07-18 DIAGNOSIS — I2510 Atherosclerotic heart disease of native coronary artery without angina pectoris: Secondary | ICD-10-CM | POA: Insufficient documentation

## 2018-07-18 DIAGNOSIS — E785 Hyperlipidemia, unspecified: Secondary | ICD-10-CM | POA: Diagnosis not present

## 2018-07-18 DIAGNOSIS — R0600 Dyspnea, unspecified: Secondary | ICD-10-CM | POA: Diagnosis not present

## 2018-07-18 NOTE — Progress Notes (Signed)
  Echocardiogram 2D Echocardiogram has been performed.  Adine Madura 07/18/2018, 11:14 AM

## 2018-07-23 ENCOUNTER — Telehealth (HOSPITAL_COMMUNITY): Payer: Self-pay

## 2018-07-23 NOTE — Telephone Encounter (Signed)
Relayed message to pt, appreciative.  

## 2018-07-23 NOTE — Telephone Encounter (Signed)
-----   Message from Laurey Morale, MD sent at 07/20/2018 12:57 PM EDT ----- EF 50-55%, mild MR with prosthetic valve, mild AS/AI.

## 2018-08-09 NOTE — Progress Notes (Signed)
Left voicemail with Caryl Pina at Dr. Harrietta Guardian office to request orders be placed in epic.

## 2018-08-12 NOTE — Patient Instructions (Addendum)
[name] [name] Spanish    Your procedure is scheduled on: 08-16-2018  Report to [facility] Main  Entrance  Report to admitting at 750 AM   YOU NEED TO HAVE A COVID 19 TEST ON TODAY @ 12:25pm, THIS TEST MUST BE DONE BEFORE SURGERY, COME TO [facility] ENTRANCE. ONCE YOUR COVID TEST IS COMPLETED, PLEASE BEGIN THE QUARANTINE INSTRUCTIONS AS OUTLINED IN YOUR HANDOUT.   Call this number if you have problems the morning of surgery [phone]    Remember:  BRUSH YOUR TEETH MORNING OF SURGERY AND RINSE YOUR MOUTH OUT, NO CHEWING GUM CANDY OR MINTS.  NO SOLID FOOD AFTER MIDNIGHT THE NIGHT PRIOR TO SURGERY. NOTHING BY MOUTH EXCEPT CLEAR LIQUIDS UNTIL  430 AM. PLEASE FINISH ENSURE DRINK PER SURGEON ORDERWHICH NEEDS TO BE COMPLETED AT 430 AM.    CLEAR LIQUID DIET   Foods Allowed                                                                     Foods Excluded  Coffee and tea, regular and decaf                             liquids that you cannot  Plain Jell-O in any flavor                                             see through such as: Fruit ices (not with fruit pulp)                                     milk, soups, orange juice  Iced Popsicles                                    All solid food Carbonated beverages, regular and diet                                    Cranberry, grape and apple juices Sports drinks like Gatorade Lightly seasoned clear broth or consume(fat free) Sugar, honey syrup  Sample Menu Breakfast                                Lunch                                     Supper Cranberry juice                    Beef broth  Chicken broth Jell-O                                     Grape juice                           Apple juice Coffee or tea                        Jell-O                                      Popsicle                                                Coffee or tea                         Coffee or tea  _____________________________________________________________________    Take these medicines the morning of surgery with A SIP OF WATER: SIMVASTATIN (ZOCOR), TAMSULOSIN (FLOMAX)  DO NOT TAKE ANY DIABETIC MEDICATIONS DAY OF YOUR SURGERY                               You may not have any metal on your body including hair pins and              piercings  Do not wear jewelry, make-up, lotions, powders or perfumes, deodorant                         Men may shave face and neck.   Do not bring valuables to the hospital. [place].  Contacts, dentures or bridgework may not be worn into surgery.  Leave suitcase in the car. After surgery it may be brought to your room.      _____________________________________________________________________             [facility] - Preparing for Surgery Before surgery, you can play an important role.  Because skin is not sterile, your skin needs to be as free of germs as possible.  You can reduce the number of germs on your skin by washing with CHG (chlorahexidine gluconate) soap before surgery.  CHG is an antiseptic cleaner which kills germs and bonds with the skin to continue killing germs even after washing. Please DO NOT use if you have an allergy to CHG or antibacterial soaps.  If your skin becomes reddened/irritated stop using the CHG and inform your nurse when you arrive at Short Stay. Do not shave (including legs and underarms) for at least 48 hours prior to the first CHG shower.  You may shave your face/neck. Please follow these instructions carefully:  1.  Shower with CHG Soap the night before surgery and the  morning of Surgery.  2.  If you choose to wash your hair, wash your hair first as usual with your  normal  shampoo.  3.  After you shampoo, rinse your hair and body  thoroughly to remove the  shampoo.                           4.  Use CHG as you would any other liquid soap.  You  can apply chg directly  to the skin and wash                       Gently with a scrungie or clean washcloth.  5.  Apply the CHG Soap to your body ONLY FROM THE NECK DOWN.   Do not use on face/ open                           Wound or open sores. Avoid contact with eyes, ears mouth and genitals (private parts).                       Wash face,  Genitals (private parts) with your normal soap.             6.  Wash thoroughly, paying special attention to the area where your surgery  will be performed.  7.  Thoroughly rinse your body with warm water from the neck down.  8.  DO NOT shower/wash with your normal soap after using and rinsing off  the CHG Soap.                9.  Pat yourself dry with a clean towel.            10.  Wear clean pajamas.            11.  Place clean sheets on your bed the night of your first shower and do not  sleep with pets. Day of Surgery : Do not apply any lotions/deodorants the morning of surgery.  Please wear clean clothes to the hospital/surgery center.  FAILURE TO FOLLOW THESE INSTRUCTIONS MAY RESULT IN THE CANCELLATION OF YOUR SURGERY PATIENT SIGNATURE_________________________________  NURSE SIGNATURE__________________________________  ________________________________________________________________________   [name] MireIncentive Spirometer  An incentive spirometer is a tool that can help keep your lungs clear and active. This tool measures how well you are filling your lungs with each breath. Taking long deep breaths may help reverse or decrease the chance of developing breathing (pulmonary) problems (especially infection) following:  A long period of time when you are unable to move or be active. BEFORE THE PROCEDURE   If the spirometer includes an indicator to show your best effort, your nurse or respiratory therapist will set it to a desired goal.  If possible, sit up straight or lean slightly forward. Try not to slouch.  Hold the incentive spirometer in an  upright position. INSTRUCTIONS FOR USE  1. Sit on the edge of your bed if possible, or sit up as far as you can in bed or on a chair. 2. Hold the incentive spirometer in an upright position. 3. Breathe out normally. 4. Place the mouthpiece in your mouth and seal your lips tightly around it. 5. Breathe in slowly and as deeply as possible, raising the piston or the ball toward the top of the column. 6. Hold your breath for 3-5 seconds or for as long as possible. Allow the piston or ball to fall to the bottom of the column. 7. Remove the mouthpiece from your mouth and breathe out normally. 8. Rest for a few seconds and repeat Steps 1 through  7 at least 10 times every 1-2 hours when you are awake. Take your time and take a few normal breaths between deep breaths. 9. The spirometer may include an indicator to show your best effort. Use the indicator as a goal to work toward during each repetition. 10. After each set of 10 deep breaths, practice coughing to be sure your lungs are clear. If you have an incision (the cut made at the time of surgery), support your incision when coughing by placing a pillow or rolled up towels firmly against it. Once you are able to get out of bed, walk around indoors and cough well. You may stop using the incentive spirometer when instructed by your caregiver.  RISKS AND COMPLICATIONS  Take your time so you do not get dizzy or light-headed.  If you are in pain, you may need to take or ask for pain medication before doing incentive spirometry. It is harder to take a deep breath if you are having pain. AFTER USE  Rest and breathe slowly and easily.  It can be helpful to keep track of a log of your progress. Your caregiver can provide you with a simple table to help with this. If you are using the spirometer at home, follow these instructions: [place] IF:   You are having difficultly using the spirometer.  You have trouble using the spirometer as often as  instructed.  Your pain medication is not giving enough relief while using the spirometer.  You develop fever of 100.5 F (38.1 C) or higher. SEEK IMMEDIATE MEDICAL CARE IF:   You cough up bloody sputum that had not been present before.  You develop fever of 102 F (38.9 C) or greater.  You develop worsening pain at or near the incision site. MAKE SURE YOU:   Understand these instructions.  Will watch your condition.  Will get help right away if you are not doing well or get worse. Document Released: 06/26/2006 Document Revised: 05/08/2011 Document Reviewed: 08/27/2006 Exit[employer] Patient Information 2014 [employer], [place].   ________________________________________________________________________  WHAT IS A BLOOD TRANSFUSION? Blood Transfusion Information  A transfusion is the replacement of blood or some of its parts. Blood is made up of multiple cells which provide different functions.  Red blood cells carry oxygen and are used for blood loss replacement.  White blood cells fight against infection.  Platelets control bleeding.  Plasma helps clot blood.  Other blood products are available for specialized needs, such as hemophilia or other clotting disorders. BEFORE THE TRANSFUSION  Who gives blood for transfusions?   Healthy volunteers who are fully evaluated to make sure their blood is safe. This is blood bank blood. Transfusion therapy is the safest it has ever been in the practice of medicine. Before blood is taken from a donor, a complete history is taken to make sure that person has no history of diseases nor engages in risky social behavior (examples are intravenous drug use or sexual activity with multiple partners). The donor's travel history is screened to minimize risk of transmitting infections, such as malaria. The donated blood is tested for signs of infectious diseases, such as HIV and hepatitis. The blood is then tested to be sure it is compatible with you in  order to minimize the chance of a transfusion reaction. If you or a relative donates blood, this is often done in anticipation of surgery and is not appropriate for emergency situations. It takes many days to process the donated blood. RISKS AND COMPLICATIONS  Although transfusion therapy is very safe and saves many lives, the main dangers of transfusion include:   Getting an infectious disease.  Developing a transfusion reaction. This is an allergic reaction to something in the blood you were given. Every precaution is taken to prevent this. The decision to have a blood transfusion has been considered carefully by your caregiver before blood is given. Blood is not given unless the benefits outweigh the risks. AFTER THE TRANSFUSION  Right after receiving a blood transfusion, you will usually feel much better and more energetic. This is especially true if your red blood cells have gotten low (anemic). The transfusion raises the level of the red blood cells which carry oxygen, and this usually causes an energy increase.  The nurse administering the transfusion will monitor you carefully for complications. HOME CARE INSTRUCTIONS  No special instructions are needed after a transfusion. You may find your energy is better. Speak with your caregiver about any limitations on activity for underlying diseases you may have. SEEK MEDICAL CARE IF:   Your condition is not improving after your transfusion.  You develop redness or irritation at the intravenous (IV) site. SEEK IMMEDIATE MEDICAL CARE IF:  Any of the following symptoms occur over the next 12 hours:  Shaking chills.  You have a temperature by mouth above 102 F (38.9 C), not controlled by medicine.  Chest, back, or muscle pain.  People around you feel you are not acting correctly or are confused.  Shortness of breath or difficulty breathing.  Dizziness and fainting.  You get a rash or develop hives.  You have a decrease in urine  output.  Your urine turns a dark color or changes to pink, red, or brown. Any of the following symptoms occur over the next 10 days:  You have a temperature by mouth above 102 F (38.9 C), not controlled by medicine.  Shortness of breath.  Weakness after normal activity.  The white part of the eye turns yellow (jaundice).  You have a decrease in the amount of urine or are urinating less often.  Your urine turns a dark color or changes to pink, red, or brown. Document Released: 02/11/2000 Document Revised: 05/08/2011 Document Reviewed: 09/30/2007 [facility] Patient Information 2014 [place], [place].  _______________________________________________________________________

## 2018-08-12 NOTE — Progress Notes (Signed)
CARDIAC CLEARANCE NOTE DR [facility] 03-21-18 Epic EKG 03-21-18 Epic ECHO 07-18-18 Epic NOTE FROM 03-21-18 Epic TO HOLD PLAVIX X 5 DAYS FROM DR [facility]

## 2018-08-12 NOTE — H&P (Signed)
TOTAL KNEE ADMISSION H&P  Patient is being admitted for left total knee arthroplasty.  Subjective:  Chief Complaint:left knee pain.  HPI: [name] [name], 85 y.o. male, has a history of pain and functional disability in the left knee due to arthritis and has failed non-surgical conservative treatments for greater than 12 weeks to includeNSAID's and/or analgesics, corticosteriod injections, viscosupplementation injections, flexibility and strengthening excercises and use of assistive devices.  Onset of symptoms was gradual, starting 10 years ago with gradually worsening course since that time.on the .  Patient currently rates pain in the left knee(s) at 10 out of 10 with activity. Patient has night pain, worsening of pain with activity and weight bearing, pain that interferes with activities of daily living, pain with passive range of motion, crepitus and joint swelling.  Patient has evidence of subchondral cysts, subchondral sclerosis, periarticular osteophytes, joint subluxation and joint space narrowing by imaging studies.  . There is no active infection.  Patient Active Problem List   Diagnosis Date Noted  . Carotid stenosis 07/28/2015  . Aortic stenosis, mild 06/05/2011  . Special screening for malignant neoplasms, colon 09/21/2010  . Irregular heart rate 09/21/2010  . S/P mitral valve repair 07/07/2010  . Coronary artery disease 07/07/2010  . DYSPNEA 01/13/2009  . MITRAL VALVE PROLAPSE, NON-RHEUMATIC 07/16/2008  . HYPERCHOLESTEROLEMIA  IIA 03/11/2008  . HYPERLIPIDEMIA-MIXED 03/11/2008  . CAD, ARTERY BYPASS GRAFT 03/11/2008   Past Medical History:  Diagnosis Date  . Acute MI ([place])   . Coronary artery disease   . Diverticulosis 06-12-2003   Colonoscopy  . Hyperlipidemia   . Hypertension   . Kidney stones   . MVP (mitral valve prolapse)   . Undiagnosed cardiac murmurs   . Urinary retention     Past Surgical History:  Procedure Laterality Date  . CORONARY ARTERY BYPASS GRAFT     2/09  . MITRAL VALVE REPAIR     2/09    No current facility-administered medications for this encounter.    Current Outpatient Medications  Medication Sig Dispense Refill Last Dose  . aspirin EC 81 MG tablet Take 81 mg by mouth daily.     . clopidogrel (PLAVIX) 75 MG tablet TAKE 1 TABLET ONCE DAILY. (Patient taking differently: Take 75 mg by mouth daily. ) 90 tablet 0   . furosemide (LASIX) 20 MG tablet TAKE 1 TABLET BY MOUTH DAILY. (Patient taking differently: Take 20 mg by mouth daily. ) 90 tablet 0   . lisinopril (PRINIVIL,ZESTRIL) 10 MG tablet TAKE 1 TABLET ONCE DAILY. (Patient taking differently: Take 10 mg by mouth daily. ) 90 tablet 0   . Multiple Vitamin (MULTIVITAMIN) tablet Take 1 tablet by mouth daily.       . simvastatin (ZOCOR) 40 MG tablet TAKE 1 TABLET ONCE DAILY. (Patient taking differently: Take 40 mg by mouth daily. ) 90 tablet 0   . Tamsulosin HCl (FLOMAX) 0.4 MG CAPS Take 0.4 mg by mouth daily.       No Known Allergies  Social History   Tobacco Use  . Smoking status: Never Smoker  . Smokeless tobacco: Never Used  Substance Use Topics  . Alcohol use: Yes    Comment: rare    Family History  Problem Relation Age of Onset  . Hypertension Other   . Colon cancer Neg Hx      Review of Systems  Constitutional: Negative.   HENT: Positive for hearing loss.   Eyes: Negative.   Respiratory: Negative.   Cardiovascular: Negative.  Gastrointestinal: Negative.   Genitourinary: Negative.   Musculoskeletal: Positive for joint pain.  Skin: Negative.   Neurological: Negative.   Endo/Heme/Allergies: Negative.   Psychiatric/Behavioral: Negative.     Objective:  Physical Exam  Constitutional: He is oriented to person, place, and time. He appears well-developed and well-nourished.  HENT:  Head: Normocephalic and atraumatic.  Nose: Nose normal.  Eyes: Pupils are equal, round, and reactive to light. Conjunctivae are normal.  Neck: Normal range of motion. No JVD  present. No tracheal deviation present. No thyromegaly present.  Cardiovascular: Normal rate, regular rhythm and intact distal pulses.  Murmur heard. Respiratory: Effort normal and breath sounds normal. No stridor.  GI: Soft. Bowel sounds are normal. There is no abdominal tenderness.  Musculoskeletal:     Left knee: He exhibits decreased range of motion, swelling, effusion, deformity and bony tenderness. Tenderness found.  Lymphadenopathy:    He has no cervical adenopathy.  Neurological: He is alert and oriented to person, place, and time.  Skin: Skin is warm and dry.  Psychiatric: He has a normal mood and affect. His behavior is normal. Thought content normal.    Vital signs in last 24 hours:120/70    Labs:see chart   Estimated body mass index is 20.25 kg/m as calculated from the following:   Height as of 09/16/16: 5\' 8"  (1.727 m).   Weight as of 03/21/18: 60.4 kg.   Imaging Review Plain radiographs demonstrate severe degenerative joint disease of the left knee(s). The overall alignment issignificant varus. The bone quality appears to be good for age and reported activity level.      Assessment/Plan:  End stage arthritis, left  knee end stage OA  The patient history, physical examination, clinical judgment of the provider and imaging studies are consistent with end stage degenerative joint disease of the left knee(s) and total knee arthroplasty is deemed medically necessary. The treatment options including medical management, injection therapy arthroscopy and arthroplasty were discussed at length. The risks and benefits of total knee arthroplasty were presented and reviewed. The risks due to aseptic loosening, infection, stiffness, patella tracking problems, thromboembolic complications and other imponderables were discussed. The patient acknowledged the explanation, agreed to proceed with the plan and consent was signed. Patient is being admitted for inpatient treatment for  surgery, pain control, PT, OT, prophylactic antibiotics, VTE prophylaxis, progressive ambulation and ADL's and discharge planning. The patient is planning to be discharged home with home health services    Anticipated LOS equal to or greater than 2 midnights due to - Age 32 and older with one or more of the following:  - Obesity  - Expected need for hospital services (PT, OT, Nursing) required for safe  discharge  - Anticipated need for postoperative skilled nursing care or inpatient rehab  - Active co-morbidities: Coronary Artery Disease and Heart Attack OR   - Unanticipated findings during/Post Surgery: None  - Patient is a high risk of re-admission due to: None

## 2018-08-13 ENCOUNTER — Other Ambulatory Visit: Payer: Self-pay

## 2018-08-13 ENCOUNTER — Other Ambulatory Visit (HOSPITAL_COMMUNITY)
Admission: RE | Admit: 2018-08-13 | Discharge: 2018-08-13 | Disposition: A | Discharge status: Home / Self Care | Payer: Medicare Other | Source: Ambulatory Visit | Attending: Specialist | Admitting: Specialist

## 2018-08-13 ENCOUNTER — Encounter (HOSPITAL_COMMUNITY): Payer: Self-pay

## 2018-08-13 ENCOUNTER — Encounter (HOSPITAL_COMMUNITY)
Admission: RE | Admit: 2018-08-13 | Discharge: 2018-08-13 | Disposition: A | Discharge status: Home / Self Care | Payer: Medicare Other | Source: Ambulatory Visit | Attending: Specialist | Admitting: Specialist

## 2018-08-13 DIAGNOSIS — Z8249 Family history of ischemic heart disease and other diseases of the circulatory system: Secondary | ICD-10-CM | POA: Diagnosis not present

## 2018-08-13 DIAGNOSIS — Z9861 Coronary angioplasty status: Secondary | ICD-10-CM | POA: Diagnosis not present

## 2018-08-13 DIAGNOSIS — I4891 Unspecified atrial fibrillation: Secondary | ICD-10-CM | POA: Diagnosis not present

## 2018-08-13 DIAGNOSIS — D649 Anemia, unspecified: Secondary | ICD-10-CM | POA: Diagnosis not present

## 2018-08-13 DIAGNOSIS — I499 Cardiac arrhythmia, unspecified: Secondary | ICD-10-CM | POA: Diagnosis not present

## 2018-08-13 DIAGNOSIS — Z951 Presence of aortocoronary bypass graft: Secondary | ICD-10-CM | POA: Diagnosis not present

## 2018-08-13 DIAGNOSIS — R338 Other retention of urine: Secondary | ICD-10-CM | POA: Diagnosis not present

## 2018-08-13 DIAGNOSIS — Z96652 Presence of left artificial knee joint: Secondary | ICD-10-CM | POA: Diagnosis not present

## 2018-08-13 DIAGNOSIS — G8918 Other acute postprocedural pain: Secondary | ICD-10-CM | POA: Diagnosis not present

## 2018-08-13 DIAGNOSIS — I252 Old myocardial infarction: Secondary | ICD-10-CM | POA: Diagnosis not present

## 2018-08-13 DIAGNOSIS — Z01812 Encounter for preprocedural laboratory examination: Secondary | ICD-10-CM | POA: Insufficient documentation

## 2018-08-13 DIAGNOSIS — Z952 Presence of prosthetic heart valve: Secondary | ICD-10-CM | POA: Diagnosis not present

## 2018-08-13 DIAGNOSIS — Z79899 Other long term (current) drug therapy: Secondary | ICD-10-CM | POA: Diagnosis not present

## 2018-08-13 DIAGNOSIS — I2510 Atherosclerotic heart disease of native coronary artery without angina pectoris: Secondary | ICD-10-CM | POA: Diagnosis not present

## 2018-08-13 DIAGNOSIS — I10 Essential (primary) hypertension: Secondary | ICD-10-CM | POA: Diagnosis not present

## 2018-08-13 DIAGNOSIS — M25762 Osteophyte, left knee: Secondary | ICD-10-CM | POA: Diagnosis not present

## 2018-08-13 DIAGNOSIS — M1712 Unilateral primary osteoarthritis, left knee: Secondary | ICD-10-CM | POA: Diagnosis not present

## 2018-08-13 DIAGNOSIS — Z7902 Long term (current) use of antithrombotics/antiplatelets: Secondary | ICD-10-CM | POA: Diagnosis not present

## 2018-08-13 DIAGNOSIS — G9340 Encephalopathy, unspecified: Secondary | ICD-10-CM | POA: Diagnosis not present

## 2018-08-13 DIAGNOSIS — Z1159 Encounter for screening for other viral diseases: Secondary | ICD-10-CM | POA: Diagnosis not present

## 2018-08-13 DIAGNOSIS — N401 Enlarged prostate with lower urinary tract symptoms: Secondary | ICD-10-CM | POA: Diagnosis not present

## 2018-08-13 DIAGNOSIS — E782 Mixed hyperlipidemia: Secondary | ICD-10-CM | POA: Diagnosis not present

## 2018-08-13 DIAGNOSIS — M25562 Pain in left knee: Secondary | ICD-10-CM | POA: Insufficient documentation

## 2018-08-13 DIAGNOSIS — D62 Acute posthemorrhagic anemia: Secondary | ICD-10-CM | POA: Diagnosis not present

## 2018-08-13 DIAGNOSIS — H9190 Unspecified hearing loss, unspecified ear: Secondary | ICD-10-CM | POA: Diagnosis not present

## 2018-08-13 DIAGNOSIS — Z7982 Long term (current) use of aspirin: Secondary | ICD-10-CM | POA: Diagnosis not present

## 2018-08-13 DIAGNOSIS — Z8674 Personal history of sudden cardiac arrest: Secondary | ICD-10-CM | POA: Diagnosis not present

## 2018-08-13 HISTORY — DX: Personal history of urinary calculi: Z87.442

## 2018-08-13 LAB — CBC
HCT: 34.9 % — ABNORMAL LOW (ref 39.0–52.0)
Hemoglobin: 11 g/dL — ABNORMAL LOW (ref 13.0–17.0)
MCH: 31.3 pg (ref 26.0–34.0)
MCHC: 31.5 g/dL (ref 30.0–36.0)
MCV: 99.1 fL (ref 80.0–100.0)
Platelets: 214 10*3/uL (ref 150–400)
RBC: 3.52 MIL/uL — ABNORMAL LOW (ref 4.22–5.81)
RDW: 14 % (ref 11.5–15.5)
WBC: 3.6 10*3/uL — ABNORMAL LOW (ref 4.0–10.5)
nRBC: 0 % (ref 0.0–0.2)

## 2018-08-13 LAB — BASIC METABOLIC PANEL
Anion gap: 10 (ref 5–15)
BUN: 27 mg/dL — ABNORMAL HIGH (ref 8–23)
CO2: 24 mmol/L (ref 22–32)
Calcium: 9.4 mg/dL (ref 8.9–10.3)
Chloride: 105 mmol/L (ref 98–111)
Creatinine, Ser: 1.14 mg/dL (ref 0.61–1.24)
GFR calc Af Amer: >60 mL/min (ref >60)
GFR calc non Af Amer: 58 mL/min — ABNORMAL LOW (ref >60)
Glucose, Bld: 108 mg/dL — ABNORMAL HIGH (ref 70–99)
Potassium: 4.8 mmol/L (ref 3.5–5.1)
Sodium: 139 mmol/L (ref 135–145)

## 2018-08-13 LAB — SURGICAL PCR SCREEN
MRSA, PCR: NEGATIVE
Staphylococcus aureus: NEGATIVE

## 2018-08-13 LAB — ABO/RH: ABO/RH(D): O NEG

## 2018-08-13 LAB — TYPE AND SCREEN
ABO/RH(D): O NEG
Antibody Screen: NEGATIVE

## 2018-08-13 NOTE — Progress Notes (Signed)
SPOKE W/  Ming     SCREENING SYMPTOMS OF COVID 19:   COUGH--NO  RUNNY NOSE--- NO  SORE THROAT---NO  NASAL CONGESTION----NO  SNEEZING----NO  SHORTNESS OF BREATH---NO  DIFFICULTY BREATHING---NO  TEMP >100.0 -----NO  UNEXPLAINED BODY ACHES------NO  CHILLS -------- NO  HEADACHES ---------NO  LOSS OF SMELL/ TASTE --------NO    HAVE YOU OR ANY FAMILY MEMBER TRAVELLED PAST 14 DAYS OUT OF THE   COUNTY---NO STATE----NO COUNTRY----NO  HAVE YOU OR ANY FAMILY MEMBER BEEN EXPOSED TO ANYONE WITH COVID 19? NO    

## 2018-08-14 LAB — NOVEL CORONAVIRUS, NAA (HOSP ORDER, SEND-OUT TO REF LAB; TAT 18-24 HRS): SARS-CoV-2, NAA: NOT DETECTED

## 2018-08-14 NOTE — Anesthesia Preprocedure Evaluation (Addendum)
Anesthesia Evaluation  Patient identified by MRN, date of birth, ID band Patient awake    Reviewed: Allergy & Precautions, NPO status , Patient's Chart, lab work & pertinent test results  Airway Mallampati: II  TM Distance: >3 FB Neck ROM: Full    Dental no notable dental hx.    Pulmonary neg pulmonary ROS,    Pulmonary exam normal breath sounds clear to auscultation       Cardiovascular hypertension, + CAD, + Past MI and + CABG  + Valvular Problems/Murmurs MR and AI  Rhythm:Regular Rate:Normal + Systolic murmurs S/P MVR   Neuro/Psych negative neurological ROS  negative psych ROS   GI/Hepatic negative GI ROS, Neg liver ROS,   Endo/Other  negative endocrine ROS  Renal/GU negative Renal ROS  negative genitourinary   Musculoskeletal negative musculoskeletal ROS (+)   Abdominal   Peds negative pediatric ROS (+)  Hematology  (+) anemia ,   Anesthesia Other Findings   Reproductive/Obstetrics negative OB ROS                          Anesthesia Physical Anesthesia Plan  ASA: III  Anesthesia Plan: General   Post-op Pain Management:    Induction: Intravenous  PONV Risk Score and Plan: 2 and Ondansetron, Dexamethasone and Treatment may vary due to age or medical condition  Airway Management Planned: LMA  Additional Equipment:   Intra-op Plan:   Post-operative Plan: Extubation in OR  Informed Consent: I have reviewed the patients History and Physical, chart, labs and discussed the procedure including the risks, benefits and alternatives for the proposed anesthesia with the patient or authorized representative who has indicated his/her understanding and acceptance.     Dental advisory given  Plan Discussed with: CRNA and Surgeon  Anesthesia Plan Comments: (See PAT note 08/13/2018, Konrad Felix, PA-C  Took plavix on Monday)      Anesthesia Quick Evaluation

## 2018-08-14 NOTE — Progress Notes (Signed)
Anesthesia Chart Review   Case: [id number] Date/Time: 08/16/18 1004   Procedure: TOTAL KNEE ARTHROPLASTY (Left ) - with adductor canal   Anesthesia type: Spinal   Pre-op diagnosis: Left knee osteoarthritis   Location: [facility] ROOM 07 / [facility]   Surgeon: Sydnee Cabal, MD      DISCUSSION: 85 yo never smoker with h/o CAD (MI, s/p CABG), mitral valve repair, HTN, HLD, left knee OA scheduled for above procedure 08/16/2018 with Dr. Sydnee Cabal.   Pt last seen by cardiologist, Dr. Loralie Champagne, 03/21/2018.  Per his note, "Patient is of acceptable risk to undergo left TKR. He will need to hold Plavix 5 days prior to procedure and restart afterwards."  Last echo 07/18/2018 EF 50-55%, mild MR with prosthetic valve, mild AS/AI.  Pt can proceed with planned procedure barring acute status change.  VS: BP (!) 133/50   Pulse (!) 53   Temp 36.4 C (Oral)   Resp 16   Ht 5\' 8"  (1.727 m)   Wt 60.1 kg   SpO2 99%   BMI 20.13 kg/m   PROVIDERS: Levin Erp, MD is PCP   Loralie Champagne, MD is Cardiologist  LABS: Labs reviewed: Acceptable for surgery. (all labs ordered are listed, but only abnormal results are displayed)  Labs Reviewed  BASIC METABOLIC PANEL - Abnormal; Notable for the following components:      Result Value   Glucose, Bld 108 (*)    BUN 27 (*)    GFR calc non Af Amer 58 (*)    All other components within normal limits  CBC - Abnormal; Notable for the following components:   WBC 3.6 (*)    RBC 3.52 (*)    Hemoglobin 11.0 (*)    HCT 34.9 (*)    All other components within normal limits  SURGICAL PCR SCREEN  TYPE AND SCREEN  ABO/RH     IMAGES:   EKG: 03/21/2018 Rate 70 bpm Sinus rhythm with 1st degree A-V block with Premature atrial complexes with Abberant conduction Incomplete right bundle branch block Borderline ECG No significant change since 2018  CV: Echo 07/18/2018 IMPRESSIONS   1. The left ventricle has low normal systolic function, with an ejection  fraction of 50-55%. The cavity size was normal. Left ventricular diastolic Doppler parameters are consistent with pseudonormalization.  2. LVEF is approximately 50 to 55% with inferior hypokinesis.  3. Left atrial size was severely dilated.  4. Right atrial size was severely dilated.  5. Moderate thickening of the mitral valve leaflet. There is mild to moderate mitral annular calcification present.  6. The aortic valve is tricuspid. Moderate calcification of the aortic valve. Aortic valve regurgitation is mild to moderate by color flow Doppler. Mild stenosis of the aortic valve.  7. Compared to echo report from 2018, no significant change in gradients through AV>.  8. Mitral Valve: The mitral valve has been repaired/replaced. Moderate thickening of the mitral valve leaflet. There is mild to moderate mitral annular calcification present. Mitral valve regurgitation is mild by color flow Doppler. A valve is present in  the mitral position  9. The interatrial septum was not assessed. Past Medical History:  Diagnosis Date  . Acute MI ([place])   . Coronary artery disease   . Diverticulosis 06-12-2003   Colonoscopy  . History of kidney stones   . Hyperlipidemia   . Hypertension   . MVP (mitral valve prolapse)   . Undiagnosed cardiac murmurs   . Urinary retention     Past  Surgical History:  Procedure Laterality Date  . CARDIAC CATHETERIZATION    . CORONARY ANGIOPLASTY    . MITRAL VALVE REPAIR     2/09    MEDICATIONS: . aspirin EC 81 MG tablet  . clopidogrel (PLAVIX) 75 MG tablet  . furosemide (LASIX) 20 MG tablet  . lisinopril (PRINIVIL,ZESTRIL) 10 MG tablet  . Multiple Vitamin (MULTIVITAMIN) tablet  . simvastatin (ZOCOR) 40 MG tablet  . Tamsulosin HCl (FLOMAX) 0.4 MG CAPS   No current facility-administered medications for this encounter.     Janey Genta [facility]-Surgical Testing [phone] 08/14/18 3:12 PM

## 2018-08-15 MED ORDER — BUPIVACAINE LIPOSOME 1.3 % IJ SUSP
20.0000 mL | Freq: Once | INTRAMUSCULAR | Status: DC
  Filled 2018-08-15: qty 20

## 2018-08-16 ENCOUNTER — Encounter (HOSPITAL_COMMUNITY): Payer: Self-pay | Admitting: Emergency Medicine

## 2018-08-16 ENCOUNTER — Encounter (HOSPITAL_COMMUNITY)
Admission: RE | Disposition: A | Discharge status: Home Health | Payer: Self-pay | Source: Home / Self Care | Attending: Specialist

## 2018-08-16 ENCOUNTER — Inpatient Hospital Stay (HOSPITAL_COMMUNITY)
Admission: RE | Admit: 2018-08-16 | Discharge: 2018-08-19 | DRG: 470 | Disposition: A | Discharge status: Home Health | Payer: Medicare Other | Attending: Specialist | Admitting: Specialist

## 2018-08-16 ENCOUNTER — Other Ambulatory Visit: Payer: Self-pay

## 2018-08-16 ENCOUNTER — Inpatient Hospital Stay (HOSPITAL_COMMUNITY): Payer: Medicare Other | Admitting: Physician Assistant

## 2018-08-16 ENCOUNTER — Inpatient Hospital Stay (HOSPITAL_COMMUNITY): Payer: Medicare Other | Admitting: Certified Registered"

## 2018-08-16 DIAGNOSIS — Z8249 Family history of ischemic heart disease and other diseases of the circulatory system: Secondary | ICD-10-CM

## 2018-08-16 DIAGNOSIS — H9190 Unspecified hearing loss, unspecified ear: Secondary | ICD-10-CM | POA: Diagnosis present

## 2018-08-16 DIAGNOSIS — Z7982 Long term (current) use of aspirin: Secondary | ICD-10-CM | POA: Diagnosis not present

## 2018-08-16 DIAGNOSIS — E7800 Pure hypercholesterolemia, unspecified: Secondary | ICD-10-CM | POA: Diagnosis present

## 2018-08-16 DIAGNOSIS — R338 Other retention of urine: Secondary | ICD-10-CM | POA: Diagnosis not present

## 2018-08-16 DIAGNOSIS — Z952 Presence of prosthetic heart valve: Secondary | ICD-10-CM | POA: Diagnosis not present

## 2018-08-16 DIAGNOSIS — M25762 Osteophyte, left knee: Secondary | ICD-10-CM | POA: Diagnosis present

## 2018-08-16 DIAGNOSIS — M25562 Pain in left knee: Secondary | ICD-10-CM | POA: Diagnosis present

## 2018-08-16 DIAGNOSIS — Z79899 Other long term (current) drug therapy: Secondary | ICD-10-CM

## 2018-08-16 DIAGNOSIS — I4891 Unspecified atrial fibrillation: Secondary | ICD-10-CM | POA: Diagnosis not present

## 2018-08-16 DIAGNOSIS — I252 Old myocardial infarction: Secondary | ICD-10-CM

## 2018-08-16 DIAGNOSIS — N401 Enlarged prostate with lower urinary tract symptoms: Secondary | ICD-10-CM | POA: Diagnosis present

## 2018-08-16 DIAGNOSIS — Z1159 Encounter for screening for other viral diseases: Secondary | ICD-10-CM

## 2018-08-16 DIAGNOSIS — I2510 Atherosclerotic heart disease of native coronary artery without angina pectoris: Secondary | ICD-10-CM | POA: Diagnosis present

## 2018-08-16 DIAGNOSIS — I10 Essential (primary) hypertension: Secondary | ICD-10-CM | POA: Diagnosis present

## 2018-08-16 DIAGNOSIS — D62 Acute posthemorrhagic anemia: Secondary | ICD-10-CM | POA: Diagnosis not present

## 2018-08-16 DIAGNOSIS — Z7902 Long term (current) use of antithrombotics/antiplatelets: Secondary | ICD-10-CM | POA: Diagnosis not present

## 2018-08-16 DIAGNOSIS — Z96652 Presence of left artificial knee joint: Secondary | ICD-10-CM | POA: Diagnosis not present

## 2018-08-16 DIAGNOSIS — Z9861 Coronary angioplasty status: Secondary | ICD-10-CM | POA: Diagnosis not present

## 2018-08-16 DIAGNOSIS — M1712 Unilateral primary osteoarthritis, left knee: Secondary | ICD-10-CM | POA: Diagnosis present

## 2018-08-16 DIAGNOSIS — Z96659 Presence of unspecified artificial knee joint: Secondary | ICD-10-CM

## 2018-08-16 DIAGNOSIS — E782 Mixed hyperlipidemia: Secondary | ICD-10-CM | POA: Diagnosis present

## 2018-08-16 DIAGNOSIS — Z8674 Personal history of sudden cardiac arrest: Secondary | ICD-10-CM

## 2018-08-16 DIAGNOSIS — I499 Cardiac arrhythmia, unspecified: Secondary | ICD-10-CM | POA: Diagnosis not present

## 2018-08-16 DIAGNOSIS — Z951 Presence of aortocoronary bypass graft: Secondary | ICD-10-CM | POA: Diagnosis not present

## 2018-08-16 DIAGNOSIS — D649 Anemia, unspecified: Secondary | ICD-10-CM | POA: Diagnosis not present

## 2018-08-16 DIAGNOSIS — R4182 Altered mental status, unspecified: Secondary | ICD-10-CM

## 2018-08-16 HISTORY — PX: TOTAL KNEE ARTHROPLASTY: SHX125

## 2018-08-16 LAB — CREATININE, SERUM
Creatinine, Ser: 1.02 mg/dL (ref 0.61–1.24)
GFR calc Af Amer: >60 mL/min (ref >60)
GFR calc non Af Amer: >60 mL/min (ref >60)

## 2018-08-16 LAB — CBC
HCT: 32.2 % — ABNORMAL LOW (ref 39.0–52.0)
Hemoglobin: 10 g/dL — ABNORMAL LOW (ref 13.0–17.0)
MCH: 31.3 pg (ref 26.0–34.0)
MCHC: 31.1 g/dL (ref 30.0–36.0)
MCV: 100.6 fL — ABNORMAL HIGH (ref 80.0–100.0)
Platelets: 183 10*3/uL (ref 150–400)
RBC: 3.2 MIL/uL — ABNORMAL LOW (ref 4.22–5.81)
RDW: 13.9 % (ref 11.5–15.5)
WBC: 6.9 10*3/uL (ref 4.0–10.5)
nRBC: 0 % (ref 0.0–0.2)

## 2018-08-16 SURGERY — ARTHROPLASTY, KNEE, TOTAL
Anesthesia: Spinal | Laterality: Left

## 2018-08-16 MED ORDER — ONDANSETRON HCL 4 MG PO TABS: 4.0000 mg | ORAL_TABLET | Freq: Four times a day (QID) | ORAL | Status: DC | PRN

## 2018-08-16 MED ORDER — HYDROMORPHONE HCL 2 MG/ML IJ SOLN
INTRAMUSCULAR | Status: AC
  Filled 2018-08-16: qty 1

## 2018-08-16 MED ORDER — MENTHOL 3 MG MT LOZG: 1.0000 | LOZENGE | OROMUCOSAL | Status: DC | PRN

## 2018-08-16 MED ORDER — MIDAZOLAM HCL 2 MG/2ML IJ SOLN
1.0000 mg | INTRAMUSCULAR | Status: DC
  Filled 2018-08-16: qty 2

## 2018-08-16 MED ORDER — FENTANYL CITRATE (PF) 100 MCG/2ML IJ SOLN
INTRAMUSCULAR | Status: AC
  Filled 2018-08-16: qty 2

## 2018-08-16 MED ORDER — ASPIRIN EC 325 MG PO TBEC: 325.0000 mg | DELAYED_RELEASE_TABLET | Freq: Every day | ORAL | 3 refills | Status: DC

## 2018-08-16 MED ORDER — EPHEDRINE 5 MG/ML INJ
INTRAVENOUS | Status: AC
  Filled 2018-08-16: qty 10

## 2018-08-16 MED ORDER — ONDANSETRON HCL 4 MG/2ML IJ SOLN
INTRAMUSCULAR | Status: DC | PRN
  Administered 2018-08-16: 4 mg via INTRAVENOUS

## 2018-08-16 MED ORDER — OXYCODONE HCL 5 MG PO TABS
5.0000 mg | ORAL_TABLET | ORAL | Status: DC | PRN
  Administered 2018-08-16 – 2018-08-17 (×3): 5 mg via ORAL
  Filled 2018-08-16 (×3): qty 1

## 2018-08-16 MED ORDER — LIDOCAINE 2% (20 MG/ML) 5 ML SYRINGE
INTRAMUSCULAR | Status: AC
  Filled 2018-08-16: qty 5

## 2018-08-16 MED ORDER — FENTANYL CITRATE (PF) 100 MCG/2ML IJ SOLN
INTRAMUSCULAR | Status: DC | PRN
  Administered 2018-08-16 (×6): 25 ug via INTRAVENOUS
  Administered 2018-08-16: 50 ug via INTRAVENOUS
  Administered 2018-08-16 (×3): 25 ug via INTRAVENOUS

## 2018-08-16 MED ORDER — GLYCOPYRROLATE 0.2 MG/ML IJ SOLN
INTRAMUSCULAR | Status: DC | PRN
  Administered 2018-08-16: 0.2 mg via INTRAVENOUS

## 2018-08-16 MED ORDER — ONDANSETRON HCL 4 MG/2ML IJ SOLN: 4.0000 mg | Freq: Four times a day (QID) | INTRAMUSCULAR | Status: DC | PRN

## 2018-08-16 MED ORDER — METHOCARBAMOL 500 MG PO TABS: 500.0000 mg | ORAL_TABLET | Freq: Three times a day (TID) | ORAL | 0 refills | Status: AC | PRN

## 2018-08-16 MED ORDER — GLYCOPYRROLATE PF 0.2 MG/ML IJ SOSY
PREFILLED_SYRINGE | INTRAMUSCULAR | Status: AC
  Filled 2018-08-16: qty 1

## 2018-08-16 MED ORDER — PROPOFOL 10 MG/ML IV BOLUS
INTRAVENOUS | Status: DC | PRN
  Administered 2018-08-16: 90 ug via INTRAVENOUS

## 2018-08-16 MED ORDER — DEXAMETHASONE SODIUM PHOSPHATE 10 MG/ML IJ SOLN
INTRAMUSCULAR | Status: DC | PRN
  Administered 2018-08-16: 8 mg via INTRAVENOUS

## 2018-08-16 MED ORDER — LIDOCAINE 2% (20 MG/ML) 5 ML SYRINGE
INTRAMUSCULAR | Status: DC | PRN
  Administered 2018-08-16: 60 mg via INTRAVENOUS

## 2018-08-16 MED ORDER — DOCUSATE SODIUM 100 MG PO CAPS: 100.0000 mg | ORAL_CAPSULE | Freq: Two times a day (BID) | ORAL | Status: DC

## 2018-08-16 MED ORDER — METOCLOPRAMIDE HCL 5 MG PO TABS: 5.0000 mg | ORAL_TABLET | Freq: Three times a day (TID) | ORAL | Status: DC | PRN

## 2018-08-16 MED ORDER — LACTATED RINGERS IV SOLN
INTRAVENOUS | Status: DC
  Administered 2018-08-16: 08:00:00 via INTRAVENOUS
  Administered 2018-08-16: 1000 mL via INTRAVENOUS

## 2018-08-16 MED ORDER — METOCLOPRAMIDE HCL 5 MG/ML IJ SOLN: 5.0000 mg | Freq: Three times a day (TID) | INTRAMUSCULAR | Status: DC | PRN

## 2018-08-16 MED ORDER — DOCUSATE SODIUM 100 MG PO CAPS
100.0000 mg | ORAL_CAPSULE | Freq: Two times a day (BID) | ORAL | Status: DC
  Administered 2018-08-17 – 2018-08-19 (×5): 100 mg via ORAL
  Filled 2018-08-16 (×4): qty 1

## 2018-08-16 MED ORDER — SODIUM CHLORIDE (PF) 0.9 % IJ SOLN
INTRAMUSCULAR | Status: AC
  Filled 2018-08-16: qty 50

## 2018-08-16 MED ORDER — ADULT MULTIVITAMIN W/MINERALS CH
1.0000 | ORAL_TABLET | Freq: Every day | ORAL | Status: DC
  Administered 2018-08-17 – 2018-08-19 (×3): 1 via ORAL
  Filled 2018-08-16 (×3): qty 1

## 2018-08-16 MED ORDER — ZOLPIDEM TARTRATE 5 MG PO TABS: 5.0000 mg | ORAL_TABLET | Freq: Every evening | ORAL | Status: DC | PRN

## 2018-08-16 MED ORDER — OXYCODONE HCL 5 MG PO TABS: 5.0000 mg | ORAL_TABLET | Freq: Three times a day (TID) | ORAL | 0 refills | Status: DC | PRN

## 2018-08-16 MED ORDER — PROMETHAZINE HCL 25 MG/ML IJ SOLN: 6.2500 mg | INTRAMUSCULAR | Status: DC | PRN

## 2018-08-16 MED ORDER — HYDROMORPHONE HCL 1 MG/ML IJ SOLN
INTRAMUSCULAR | Status: AC
  Filled 2018-08-16: qty 1

## 2018-08-16 MED ORDER — ATROPINE SULFATE 1 MG/ML IJ SOLN
INTRAMUSCULAR | Status: DC | PRN
  Administered 2018-08-16: 0.4 mg via INTRAVENOUS

## 2018-08-16 MED ORDER — METHOCARBAMOL 500 MG PO TABS
500.0000 mg | ORAL_TABLET | Freq: Four times a day (QID) | ORAL | Status: DC | PRN
  Administered 2018-08-16: 500 mg via ORAL
  Filled 2018-08-16: qty 1

## 2018-08-16 MED ORDER — SODIUM CHLORIDE (PF) 0.9 % IJ SOLN
INTRAMUSCULAR | Status: AC
  Filled 2018-08-16: qty 10

## 2018-08-16 MED ORDER — DEXAMETHASONE SODIUM PHOSPHATE 10 MG/ML IJ SOLN
INTRAMUSCULAR | Status: AC
  Filled 2018-08-16: qty 1

## 2018-08-16 MED ORDER — FENTANYL CITRATE (PF) 100 MCG/2ML IJ SOLN
50.0000 ug | INTRAMUSCULAR | Status: DC
  Administered 2018-08-16: 50 ug via INTRAVENOUS
  Filled 2018-08-16: qty 2

## 2018-08-16 MED ORDER — ACETAMINOPHEN 325 MG PO TABS
325.0000 mg | ORAL_TABLET | Freq: Four times a day (QID) | ORAL | Status: DC | PRN
  Filled 2018-08-16: qty 2

## 2018-08-16 MED ORDER — SIMVASTATIN 40 MG PO TABS
40.0000 mg | ORAL_TABLET | Freq: Every day | ORAL | Status: DC
  Administered 2018-08-17 – 2018-08-19 (×3): 40 mg via ORAL
  Filled 2018-08-16: qty 2
  Filled 2018-08-16 (×2): qty 1

## 2018-08-16 MED ORDER — SODIUM CHLORIDE 0.9 % IR SOLN
Status: DC | PRN
  Administered 2018-08-16: 1000 mL

## 2018-08-16 MED ORDER — CLONIDINE HCL (ANALGESIA) 100 MCG/ML EP SOLN
EPIDURAL | Status: DC | PRN
  Administered 2018-08-16: 70 ug

## 2018-08-16 MED ORDER — ATROPINE SULFATE 1 MG/10ML IJ SOSY
PREFILLED_SYRINGE | INTRAMUSCULAR | Status: AC
  Filled 2018-08-16: qty 10

## 2018-08-16 MED ORDER — HYDROMORPHONE HCL 1 MG/ML IJ SOLN
0.5000 mg | INTRAMUSCULAR | Status: DC | PRN
  Administered 2018-08-17: 1 mg via INTRAVENOUS
  Filled 2018-08-16: qty 1

## 2018-08-16 MED ORDER — TAMSULOSIN HCL 0.4 MG PO CAPS
0.4000 mg | ORAL_CAPSULE | Freq: Every day | ORAL | Status: DC
  Administered 2018-08-17 – 2018-08-19 (×3): 0.4 mg via ORAL
  Filled 2018-08-16 (×3): qty 1

## 2018-08-16 MED ORDER — ONDANSETRON HCL 4 MG/2ML IJ SOLN
INTRAMUSCULAR | Status: AC
  Filled 2018-08-16: qty 2

## 2018-08-16 MED ORDER — CLOPIDOGREL BISULFATE 75 MG PO TABS
75.0000 mg | ORAL_TABLET | Freq: Every day | ORAL | Status: DC
  Administered 2018-08-17 – 2018-08-19 (×3): 75 mg via ORAL
  Filled 2018-08-16 (×3): qty 1

## 2018-08-16 MED ORDER — PHENOL 1.4 % MT LIQD: 1.0000 | OROMUCOSAL | Status: DC | PRN

## 2018-08-16 MED ORDER — OXYCODONE HCL 5 MG PO TABS: 10.0000 mg | ORAL_TABLET | ORAL | Status: DC | PRN

## 2018-08-16 MED ORDER — EPHEDRINE SULFATE-NACL 50-0.9 MG/10ML-% IV SOSY
PREFILLED_SYRINGE | INTRAVENOUS | Status: DC | PRN
  Administered 2018-08-16: 5 mg via INTRAVENOUS
  Administered 2018-08-16 (×3): 10 mg via INTRAVENOUS
  Administered 2018-08-16: 20 mg via INTRAVENOUS
  Administered 2018-08-16 (×3): 10 mg via INTRAVENOUS

## 2018-08-16 MED ORDER — POVIDONE-IODINE 7.5 % EX SOLN: Freq: Once | CUTANEOUS | Status: DC

## 2018-08-16 MED ORDER — ENOXAPARIN SODIUM 30 MG/0.3ML ~~LOC~~ SOLN: 30.0000 mg | Freq: Two times a day (BID) | SUBCUTANEOUS | Status: DC

## 2018-08-16 MED ORDER — FUROSEMIDE 20 MG PO TABS
20.0000 mg | ORAL_TABLET | Freq: Every day | ORAL | Status: DC
  Administered 2018-08-18 – 2018-08-19 (×2): 20 mg via ORAL
  Filled 2018-08-16 (×2): qty 1

## 2018-08-16 MED ORDER — METHOCARBAMOL 1000 MG/10ML IJ SOLN
500.0000 mg | Freq: Four times a day (QID) | INTRAVENOUS | Status: DC | PRN
  Filled 2018-08-16: qty 5

## 2018-08-16 MED ORDER — HYDROMORPHONE HCL 1 MG/ML IJ SOLN
0.2500 mg | INTRAMUSCULAR | Status: DC | PRN
  Administered 2018-08-16: 0.5 mg via INTRAVENOUS

## 2018-08-16 MED ORDER — LISINOPRIL 10 MG PO TABS
10.0000 mg | ORAL_TABLET | Freq: Every day | ORAL | Status: DC
  Administered 2018-08-18 – 2018-08-19 (×2): 10 mg via ORAL
  Filled 2018-08-16 (×2): qty 1

## 2018-08-16 MED ORDER — POVIDONE-IODINE 10 % EX SWAB
2.0000 "application " | Freq: Once | CUTANEOUS | Status: AC
  Administered 2018-08-16: 2 via TOPICAL

## 2018-08-16 MED ORDER — PROPOFOL 10 MG/ML IV BOLUS
INTRAVENOUS | Status: AC
  Filled 2018-08-16: qty 60

## 2018-08-16 MED ORDER — OXYCODONE HCL ER 10 MG PO T12A: 10.0000 mg | EXTENDED_RELEASE_TABLET | Freq: Two times a day (BID) | ORAL | Status: DC

## 2018-08-16 MED ORDER — HYDROMORPHONE HCL 1 MG/ML IJ SOLN
INTRAMUSCULAR | Status: DC | PRN
  Administered 2018-08-16 (×2): 0.5 mg via INTRAVENOUS

## 2018-08-16 MED ORDER — BUPIVACAINE HCL (PF) 0.5 % IJ SOLN
INTRAMUSCULAR | Status: DC | PRN
  Administered 2018-08-16: 20 mL via PERINEURAL

## 2018-08-16 MED ORDER — SODIUM CHLORIDE 0.9 % IV SOLN
INTRAVENOUS | Status: DC | PRN
  Administered 2018-08-16: 13:00:00 80 mL

## 2018-08-16 MED ORDER — CEFAZOLIN SODIUM-DEXTROSE 2-4 GM/100ML-% IV SOLN
2.0000 g | INTRAVENOUS | Status: AC
  Administered 2018-08-16: 2 g via INTRAVENOUS
  Filled 2018-08-16: qty 100

## 2018-08-16 MED ORDER — DEXTROSE-NACL 5-0.45 % IV SOLN
INTRAVENOUS | Status: DC
  Administered 2018-08-16: 50 mL/h via INTRAVENOUS

## 2018-08-16 MED ORDER — ENOXAPARIN SODIUM 30 MG/0.3ML ~~LOC~~ SOLN
30.0000 mg | Freq: Once | SUBCUTANEOUS | Status: AC
  Administered 2018-08-16: 30 mg via SUBCUTANEOUS
  Filled 2018-08-16: qty 0.3

## 2018-08-16 SURGICAL SUPPLY — 65 items
ADH SKN CLS APL DERMABOND .7 (GAUZE/BANDAGES/DRESSINGS) ×1
ATTUNE MED DOME PAT 38 KNEE (Knees) ×1 IMPLANT
ATTUNE PS FEM LT SZ 6 CEM KNEE (Femur) ×1 IMPLANT
ATTUNE PSRP INSR SZ6 5 KNEE (Insert) ×1 IMPLANT
BAG DECANTER FOR FLEXI CONT (MISCELLANEOUS) IMPLANT
BAG SPEC THK2 15X12 ZIP CLS (MISCELLANEOUS) ×2
BAG ZIPLOCK 12X15 (MISCELLANEOUS) ×4 IMPLANT
BANDAGE ACE 4X5 VEL STRL LF (GAUZE/BANDAGES/DRESSINGS) ×2 IMPLANT
BANDAGE ACE 6X5 VEL STRL LF (GAUZE/BANDAGES/DRESSINGS) ×2 IMPLANT
BANDAGE ELASTIC 4 VELCRO ST LF (GAUZE/BANDAGES/DRESSINGS) ×1 IMPLANT
BASE TIBIAL ROT PLAT SZ 7 KNEE (Knees) IMPLANT
BLADE SAG 18X100X1.27 (BLADE) ×2 IMPLANT
BLADE SAW SGTL 11.0X1.19X90.0M (BLADE) ×2 IMPLANT
BOWL SMART MIX CTS (DISPOSABLE) ×2 IMPLANT
BSPLAT TIB 7 CMNT ROT PLAT STR (Knees) ×1 IMPLANT
CEMENT HV SMART SET (Cement) ×2 IMPLANT
COVER SURGICAL LIGHT HANDLE (MISCELLANEOUS) ×2 IMPLANT
COVER WAND RF STERILE (DRAPES) IMPLANT
CUFF TOURN SGL QUICK 34 (TOURNIQUET CUFF) ×2
CUFF TRNQT CYL 34X4.125X (TOURNIQUET CUFF) ×1 IMPLANT
DECANTER SPIKE VIAL GLASS SM (MISCELLANEOUS) ×3 IMPLANT
DERMABOND ADVANCED (GAUZE/BANDAGES/DRESSINGS) ×1
DERMABOND ADVANCED .7 DNX12 (GAUZE/BANDAGES/DRESSINGS) ×1 IMPLANT
DRAPE U-SHAPE 47X51 STRL (DRAPES) ×2 IMPLANT
DRSG AQUACEL AG ADV 3.5X10 (GAUZE/BANDAGES/DRESSINGS) ×2 IMPLANT
DRSG TEGADERM 2-3/8X2-3/4 SM (GAUZE/BANDAGES/DRESSINGS) ×2 IMPLANT
DRSG TEGADERM 4X4.75 (GAUZE/BANDAGES/DRESSINGS) ×2 IMPLANT
DURAPREP 26ML APPLICATOR (WOUND CARE) ×4 IMPLANT
ELECT REM PT RETURN 15FT ADLT (MISCELLANEOUS) ×2 IMPLANT
EVACUATOR 1/8 PVC DRAIN (DRAIN) ×2 IMPLANT
GAUZE SPONGE 2X2 8PLY STRL LF (GAUZE/BANDAGES/DRESSINGS) ×1 IMPLANT
GLOVE BIO SURGEON STRL SZ7.5 (GLOVE) ×4 IMPLANT
GLOVE BIOGEL PI IND STRL 8 (GLOVE) ×2 IMPLANT
GLOVE BIOGEL PI INDICATOR 8 (GLOVE) ×2
GLOVE ECLIPSE 8.0 STRL XLNG CF (GLOVE) ×4 IMPLANT
GLOVE SURG ORTHO 9.0 STRL STRW (GLOVE) ×2 IMPLANT
GOWN STRL REUS W/TWL XL LVL3 (GOWN DISPOSABLE) ×4 IMPLANT
HANDPIECE INTERPULSE COAX TIP (DISPOSABLE) ×2
HOLDER FOLEY CATH W/STRAP (MISCELLANEOUS) ×1 IMPLANT
KIT TURNOVER KIT A (KITS) IMPLANT
NS IRRIG 1000ML POUR BTL (IV SOLUTION) ×2 IMPLANT
PACK TOTAL KNEE CUSTOM (KITS) ×2 IMPLANT
PIN THREADED HEADED SIGMA (PIN) ×1 IMPLANT
PROTECTOR NERVE ULNAR (MISCELLANEOUS) ×2 IMPLANT
SET HNDPC FAN SPRY TIP SCT (DISPOSABLE) ×1 IMPLANT
SET PAD KNEE POSITIONER (MISCELLANEOUS) ×2 IMPLANT
SPONGE GAUZE 2X2 STER 10/PKG (GAUZE/BANDAGES/DRESSINGS) ×2
SPONGE LAP 18X18 RF (DISPOSABLE) IMPLANT
SPONGE SURGIFOAM ABS GEL 100 (HEMOSTASIS) ×2 IMPLANT
STOCKINETTE 6  STRL (DRAPES) ×1
STOCKINETTE 6 STRL (DRAPES) ×1 IMPLANT
SUT BONE WAX W31G (SUTURE) IMPLANT
SUT MNCRL AB 3-0 PS2 18 (SUTURE) ×2 IMPLANT
SUT VIC AB 1 CT1 27 (SUTURE) ×6
SUT VIC AB 1 CT1 27XBRD ANTBC (SUTURE) ×3 IMPLANT
SUT VIC AB 2-0 CT1 27 (SUTURE) ×4
SUT VIC AB 2-0 CT1 TAPERPNT 27 (SUTURE) ×2 IMPLANT
SUT VLOC 180 0 24IN GS25 (SUTURE) ×2 IMPLANT
SYR 50ML LL SCALE MARK (SYRINGE) IMPLANT
TAPE STRIPS DRAPE STRL (GAUZE/BANDAGES/DRESSINGS) ×2 IMPLANT
TIBIAL BASE ROT PLAT SZ 7 KNEE (Knees) ×2 IMPLANT
TRAY FOLEY MTR SLVR 16FR STAT (SET/KITS/TRAYS/PACK) ×2 IMPLANT
WATER STERILE IRR 1000ML POUR (IV SOLUTION) ×4 IMPLANT
WRAP KNEE MAXI GEL POST OP (GAUZE/BANDAGES/DRESSINGS) ×2 IMPLANT
YANKAUER SUCT BULB TIP 10FT TU (MISCELLANEOUS) ×2 IMPLANT

## 2018-08-16 NOTE — Transfer of Care (Signed)
Immediate Anesthesia Transfer of Care Note  Patient: [name] [name]  Procedure(s) Performed: TOTAL KNEE ARTHROPLASTY (Left )  Patient Location: PACU  Anesthesia Type:General  Level of Consciousness: awake and drowsy  Airway & Oxygen Therapy: Patient Spontanous Breathing and Patient connected to face mask oxygen  Post-op Assessment: Report given to RN and Post -op Vital signs reviewed and stable  Post vital signs: Reviewed and stable  Last Vitals:  Vitals Value Taken Time  BP 133/65 08/16/18 1355  Temp    Pulse 67 08/16/18 1357  Resp 19 08/16/18 1357  SpO2 100 % 08/16/18 1357  Vitals shown include unvalidated device data.  Last Pain:  Vitals:   08/16/18 0955  TempSrc:   PainSc: 0-No pain      Patients Stated Pain Goal: 4 (23/53/61 4431)  Complications: No apparent anesthesia complications

## 2018-08-16 NOTE — Op Note (Signed)
DATE OF SURGERY:  08/16/2018  TIME: 1:27 PM  PATIENT NAME:  [name] [name]    AGE: 85 y.o.   PRE-OPERATIVE DIAGNOSIS:  Left knee osteoarthritis  POST-OPERATIVE DIAGNOSIS:  Left knee osteoarthritis  PROCEDURE:  Procedure(s): TOTAL KNEE ARTHROPLASTY  SURGEON:  Ellowyn Rieves ANDREW  ASSISTANT:  Bryson Stilwell, PA-C, present and scrubbed throughout the case, critical for assistance with exposure, retraction, instrumentation, and closure.  OPERATIVE IMPLANTS: Depuy PFC Attune  Rotating Platform.  Femur size 6, Tibia size 7, Patella size 35 3-peg oval button, with a 5 mm polyethylene insert.   PREOPERATIVE INDICATIONS:   [name] [name] is a 85 y.o. year old male with end stage bone on bone arthritis of the knee who failed conservative treatment and elected for Total Knee Arthroplasty.   The risks, benefits, and alternatives were discussed at length including but not limited to the risks of infection, bleeding, nerve injury, stiffness, blood clots, the need for revision surgery, cardiopulmonary complications, among others, and they were willing to proceed.  OPERATIVE DESCRIPTION:  The patient was brought to the operative room and placed in a supine position.  Spinal anesthesia was administered.  IV antibiotics were given.  The lower extremity was prepped and draped in the usual sterile fashion.  Time out was performed.  The leg was elevated and exsanguinated and the tourniquet was inflated.  Anterior quadriceps tendon splitting approach was performed.  The patella was retracted and osteophytes were removed.  The anterior horn of the medial and lateral meniscus was removed and cruciate ligaments resected.   The distal femur was opened with the drill and the intramedullary distal femoral cutting jig was utilized, set at 5 degrees resecting 10 mm off the distal femur.  Care was taken to protect the collateral ligaments.  The distal femoral sizing jig was applied, taking care to avoid  notching.  Then the 4-in-1 cutting jig was applied and the anterior and posterior femur was cut, along with the chamfer cuts.    Then the extramedullary tibial cutting jig was utilized making the appropriate cut using the anterior tibial crest as a reference building in appropriate posterior slope.  Care was taken during the cut to protect the medial and collateral ligaments.  The proximal tibia was removed along with the posterior horns of the menisci.   The posterior medial femoral osteophytes and posterior lateral femoral osteophytes were removed.    The flexion gap was then measured and was symmetric with the extension gap, measured at 5.  I completed the distal femoral preparation using the appropriate jig to prepare the box.  The patella was then measured, and cut with the saw.    The proximal tibia sized and prepared accordingly with the reamer and the punch, and then all components were trialed with the trial insert.  The knee was found to have excellent balance and full motion.    The above named components were then cemented into place and all excess cement was removed.  The trial polyethylene component was in place during cementation, and then was exchanged for the real polyethylene component.    The knee was easily taken through a range of motion and the patella tracked well and the knee irrigated copiously and the parapatellar and subcutaneous tissue closed with vicryl, and monocryl with steri strips for the skin.  The arthrotomy was closed at 90 of flexion. The wounds were dressed with sterile gauze and the tourniquet released and the patient was awakened and returned to the PACU  in stable and satisfactory condition.  There were no complications.  Total tourniquet time was 80 minutes.

## 2018-08-16 NOTE — Evaluation (Signed)
Physical Therapy Evaluation Patient Details Name: [name] [name] MRN: [id number] DOB: 04-Jan-1929 Today's Date: 08/16/2018   History of Present Illness  85 yo male s/p L TKR on 08/16/18. PMH includes MVR, HLD, CAD s/p CABG, HTN.  Clinical Impression  Pt presents with L knee pain, decreased L knee ROM, difficulty performing mobility tasks, increased time and effort to perform mobility tasks, and decreased activity tolerance. Pt to benefit from acute PT to address deficits. Pt ambulated hallway distance with RW with min guard assist, multiple verbal cues for safety required. Pt educated on ankle pumps (20/hour) to perform this afternoon/evening to increase circulation, to pt's tolerance and limited by pain. Pt plans to d/c to his home with his daughter. PT to progress mobility as tolerated, and will continue to follow acutely.        Follow Up Recommendations Follow surgeon's recommendation for DC plan and follow-up therapies;Supervision for mobility/OOB    Equipment Recommendations  None recommended by PT    Recommendations for Other Services       Precautions / Restrictions Precautions Precautions: Fall Restrictions Weight Bearing Restrictions: No Other Position/Activity Restrictions: WBAT      Mobility  Bed Mobility Overal bed mobility: Needs Assistance Bed Mobility: Supine to Sit     Supine to sit: Min assist;HOB elevated     General bed mobility comments: Min assist for LLE management, increased time and effort.  Transfers Overall transfer level: Needs assistance Equipment used: Rolling walker (2 wheeled) Transfers: Sit to/from Stand Sit to Stand: Min guard         General transfer comment: min guard for safety, verbal cuing for hand placement.  Ambulation/Gait Ambulation/Gait assistance: Min guard Gait Distance (Feet): 45 Feet Assistive device: Rolling walker (2 wheeled) Gait Pattern/deviations: Step-to pattern;Decreased step length - right;Decreased step length  - left;Ataxic;Trunk flexed Gait velocity: decr   General Gait Details: min guard for safety. verbal cuing for upright posture, placement in RW x5, sequencing.  Stairs            Wheelchair Mobility    Modified Rankin (Stroke Patients Only)       Balance Overall balance assessment: Mild deficits observed, not formally tested;History of Falls                                           Pertinent Vitals/Pain Pain Assessment: 0-10 Pain Score: 6  Pain Location: L knee, with ambulation Pain Descriptors / Indicators: Sore Pain Intervention(s): Limited activity within patient's tolerance;Monitored during session;Premedicated before session;Repositioned;Ice applied    Home Living Family/patient expects to be discharged to:: Private residence Living Arrangements: Spouse/significant other Available Help at Discharge: Family;Available 24 hours/day(daughter to stay with pt for as long as he needs; wife has dementia and will not be able to assist pt per pt report) Type of Home: House Home Access: Stairs to enter Entrance Stairs-Rails: Right;Left Entrance Stairs-Number of Steps: 2 Home Layout: Two level;Able to live on main level with bedroom/bathroom Home Equipment: Dan Humphreys - 2 wheels;Cane - single point      Prior Function Level of Independence: Independent         Comments: Pt reports running with his corgis, working full time and 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   Dominant Hand: Right    Extremity/Trunk Assessment   Upper Extremity Assessment Upper  Extremity Assessment: Overall WFL for tasks assessed    Lower Extremity Assessment Lower Extremity Assessment: Generalized weakness;LLE deficits/detail LLE Deficits / Details: suspected post-surgical weakness LLE Sensation: WNL    Cervical / Trunk Assessment Cervical / Trunk Assessment: Normal  Communication   Communication: No difficulties  Cognition  Arousal/Alertness: Awake/alert Behavior During Therapy: WFL for tasks assessed/performed Overall Cognitive Status: Within Functional Limits for tasks assessed                                 General Comments: Pt is pleasant, very talkative and difficult to redirect towards mobility at times.      General Comments      Exercises     Assessment/Plan    PT Assessment Patient needs continued PT services  PT Problem List Decreased strength;Decreased mobility;Decreased safety awareness;Decreased range of motion;Decreased activity tolerance;Decreased balance;Decreased knowledge of use of DME;Pain       PT Treatment Interventions DME instruction;Therapeutic activities;Gait training;Therapeutic exercise;Patient/family education;Balance training;Stair training;Functional mobility training    PT Goals (Current goals can be found in the Care Plan section)  Acute Rehab PT Goals Patient Stated Goal: return to active lifestyle PT Goal Formulation: With patient Time For Goal Achievement: 08/23/18 Potential to Achieve Goals: Good    Frequency 7X/week   Barriers to discharge        Co-evaluation               AM-PAC PT "6 Clicks" Mobility  Outcome Measure Help needed turning from your back to your side while in a flat bed without using bedrails?: A Little Help needed moving from lying on your back to sitting on the side of a flat bed without using bedrails?: A Little Help needed moving to and from a bed to a chair (including a wheelchair)?: A Little Help needed standing up from a chair using your arms (e.g., wheelchair or bedside chair)?: A Little Help needed to walk in hospital room?: A Little Help needed climbing 3-5 steps with a railing? : A Little 6 Click Score: 18    End of Session Equipment Utilized During Treatment: Gait belt Activity Tolerance: Patient tolerated treatment well Patient left: in chair;with chair alarm set;with call bell/phone within reach(on  SCD break for skin integrity) Nurse Communication: Mobility status PT Visit Diagnosis: Other abnormalities of gait and mobility (R26.89);Difficulty in walking, not elsewhere classified (R26.2)    Time: 1610-9604 PT Time Calculation (min) (ACUTE ONLY): 34 min   Charges:   PT Evaluation $PT Eval Low Complexity: 1 Low PT Treatments $Gait Training: 8-22 mins       Julien Girt, PT Acute Rehabilitation Services Pager [phone]  Office [phone]   [name] [name] 08/16/2018, 7:14 PM

## 2018-08-16 NOTE — Anesthesia Procedure Notes (Signed)
Anesthesia Regional Block: Adductor canal block   Pre-Anesthetic Checklist: ,, timeout performed, Correct Patient, Correct Site, Correct Laterality, Correct Procedure, Correct Position, site marked, Risks and benefits discussed,  Surgical consent,  Pre-op evaluation,  At surgeon's request and post-op pain management  Laterality: Left  Prep: chloraprep       Needles:  Injection technique: Single-shot  Needle Type: Echogenic Needle     Needle Length: 9cm      Additional Needles:   Procedures:,,,, ultrasound used (permanent image in chart),,,,  Narrative:  Start time: 08/16/2018 9:44 AM End time: 08/16/2018 9:52 AM Injection made incrementally with aspirations every 5 mL.  Performed by: Personally  Anesthesiologist: Myrtie Soman, MD  Additional Notes: Patient tolerated the procedure well without complications

## 2018-08-16 NOTE — Discharge Summary (Addendum)
Physician Discharge Summary  Patient ID: BHARGAV GOYETTE MRN: [id number] DOB/AGE: 1928-07-01 85 y.o.  Admit date: 08/16/2018 Discharge date: 08/18/18  Admission Diagnoses: Left knee osteoarthritis Past Medical History:  Diagnosis Date  . Acute MI ([facility])   . Coronary artery disease   . Diverticulosis 06-12-2003   Colonoscopy  . History of kidney stones   . Hyperlipidemia   . Hypertension   . MVP (mitral valve prolapse)   . Undiagnosed cardiac murmurs   . Urinary retention     Discharge Diagnoses:  Principal Problem:   S/P TKR (total knee replacement) using cement, left Active Problems:   HYPERCHOLESTEROLEMIA  IIA   Osteoarthritis of left knee   S/P knee replacement   AMS (altered mental status)   New onset a-fib ([facility])   Surgeries: Procedure(s): TOTAL KNEE ARTHROPLASTY on 08/16/2018    Consultants: Treatment Team:  Merlyn Albert, MD  Discharged Condition: Improved  Hospital Course: TOUA DROGE is an 85 y.o. male who was admitted 08/16/2018 with a chief complaint of left knee pain, and found to have a diagnosis of Left knee osteoarthritis.  They were brought to the operating room on 08/16/2018 and underwent left TOTAL KNEE ARTHROPLASTY.    They were given perioperative antibiotics:  Anti-infectives (From admission, onward)   Start     Dose/Rate Route Frequency Ordered Stop   08/19/18 0000  sulfamethoxazole-trimethoprim (BACTRIM DS) 800-160 MG tablet     1 tablet Oral 2 times daily 08/19/18 1021     08/16/18 0815  ceFAZolin (ANCEF) IVPB 2g/100 mL premix     2 g 200 mL/hr over 30 Minutes Intravenous On call to O.R. 08/16/18 0800 08/16/18 1114    .  They were given sequential compression devices, early ambulation, and Plavix & ASA for DVT prophylaxis.  Recent vital signs:  Patient Vitals for the past 24 hrs:  BP Temp Temp src Pulse Resp SpO2  08/19/18 1312 123/68 98.3 F (36.8 C) Oral 62 16 100 %  08/19/18 1101 111/65 - - 77 16 99 %  .  Recent laboratory studies:  Ct Head Wo Contrast  Result Date: 08/18/2018 CLINICAL DATA:  Encephalopathy. EXAM: CT HEAD WITHOUT CONTRAST TECHNIQUE: Contiguous axial images were obtained from the base of the skull through the vertex without intravenous contrast. COMPARISON:  None. FINDINGS: Brain: Cerebral volume and ventricle size normal for age. Small benign cyst left basal ganglia. Mild white matter changes appear chronic. No acute cortical infarct, hemorrhage, or mass. Vascular: Atherosclerotic calcification. Negative for hyperdense vessel Skull: Negative Sinuses/Orbits: Negative Other: None IMPRESSION: No acute abnormality. Mild chronic appearing white matter changes, typical for age. Electronically Signed   By: Marlan Palau M.D.   On: 08/18/2018 09:45    Discharge Medications:   Allergies as of 08/19/2018   No Known Allergies     Medication List    TAKE these medications   aspirin EC 325 MG tablet Take 1 tablet (325 mg total) by mouth daily for 30 days. What changed:   medication strength  how much to take   clopidogrel 75 MG tablet Commonly known as: PLAVIX TAKE 1 TABLET ONCE DAILY.   docusate sodium 100 MG capsule Commonly known as: Colace Take 1 capsule (100 mg total) by mouth 2 (two) times daily.   ferrous sulfate 325 (65 FE) MG tablet Commonly known as: FerrouSul Take 1 tablet (325 mg total) by mouth 3 (three) times daily with meals.   furosemide 20 MG tablet Commonly known as: LASIX TAKE 1 TABLET  BY MOUTH DAILY.   lisinopril 10 MG tablet Commonly known as: ZESTRIL TAKE 1 TABLET ONCE DAILY.   methocarbamol 500 MG tablet Commonly known as: Robaxin Take 1 tablet (500 mg total) by mouth every 8 (eight) hours as needed for up to 15 days for muscle spasms.   multivitamin tablet Take 1 tablet by mouth daily.   polyethylene glycol 17 g packet Commonly known as: MIRALAX / GLYCOLAX Take 17 g by mouth 2 (two) times daily.   simvastatin 40 MG tablet Commonly known as: ZOCOR TAKE 1 TABLET  ONCE DAILY.   sulfamethoxazole-trimethoprim 800-160 MG tablet Commonly known as: BACTRIM DS Take 1 tablet by mouth 2 (two) times daily.   tamsulosin 0.4 MG Caps capsule Commonly known as: FLOMAX Take 0.4 mg by mouth daily.            Discharge Care Instructions  (From admission, onward)         Start     Ordered   08/19/18 0000  Change dressing    Comments: Maintain surgical dressing until follow up in the clinic. If the edges start to pull up, may reinforce with tape. If the dressing is no longer working, may remove and cover with gauze and tape, but must keep the area dry and clean.  Call with any questions or concerns.   08/19/18 [id number]          Diagnostic Studies: Ct Head Wo Contrast  Result Date: 08/18/2018 CLINICAL DATA:  Encephalopathy. EXAM: CT HEAD WITHOUT CONTRAST TECHNIQUE: Contiguous axial images were obtained from the base of the skull through the vertex without intravenous contrast. COMPARISON:  None. FINDINGS: Brain: Cerebral volume and ventricle size normal for age. Small benign cyst left basal ganglia. Mild white matter changes appear chronic. No acute cortical infarct, hemorrhage, or mass. Vascular: Atherosclerotic calcification. Negative for hyperdense vessel Skull: Negative Sinuses/Orbits: Negative Other: None IMPRESSION: No acute abnormality. Mild chronic appearing white matter changes, typical for age. Electronically Signed   By: Marlan Palauharles  Garfield M.D.   On: 08/18/2018 09:45    They benefited maximally from their hospital stay and there were no complications.     Disposition:Home or Self Care      Discharge Instructions    Call MD / Call 911   Complete by: As directed    If you experience chest pain or shortness of breath, CALL 911 and be transported to the hospital emergency room.  If you develope a fever above 101 F, pus (white drainage) or increased drainage or redness at the wound, or calf pain, call your surgeon's office.   Change dressing    Complete by: As directed    Maintain surgical dressing until follow up in the clinic. If the edges start to pull up, may reinforce with tape. If the dressing is no longer working, may remove and cover with gauze and tape, but must keep the area dry and clean.  Call with any questions or concerns.   Constipation Prevention   Complete by: As directed    Drink plenty of fluids.  Prune juice may be helpful.  You may use a stool softener, such as Colace (over the counter) 100 mg twice a day.  Use MiraLax (over the counter) for constipation as needed.   Diet - low sodium heart healthy   Complete by: As directed    Discharge instructions   Complete by: As directed    Maintain surgical dressing until follow up in the clinic. If the edges start to  pull up, may reinforce with tape. If the dressing is no longer working, may remove and cover with gauze and tape, but must keep the area dry and clean.  Follow up in 2 weeks at [facility]. Call with any questions or concerns.   Increase activity slowly as tolerated   Complete by: As directed    Weight bearing as tolerated with assist device (walker, cane, etc) as directed, use it as long as suggested by your surgeon or therapist, typically at least 4-6 weeks.   TED hose   Complete by: As directed    Use stockings (TED hose) for 2 weeks on both leg(s).  You may remove them at night for sleeping.     Follow-up Information    [facility] Follow up.   Contact information: [phone]       Sydnee Cabal, MD. Schedule an appointment as soon as possible for a visit in 2 week(s).   Specialty: Orthopedic Surgery Contact information: [address] [place] [address] [phone]        [facility]. Schedule an appointment as soon as possible for a visit in 1 week(s).        Levin Erp, MD. Schedule an appointment as soon as possible for a visit in 1 week(s).   Specialty:  Internal Medicine Why: Discuss changing to Eliquis due to a-fib in the hospital and other cardiac issues. Contact information: [place], [place] [address] [phone]        Larey Dresser, MD Follow up on 09/26/2018.   Specialty: Cardiology Why: Please arrive 15 minutes early for your 9:40am appointment Contact information: [place] [address] [phone]        [place] [facility] Follow up.   Specialty: Cardiology Why: You have been ordered for a heart monitor. Our office will contact you directly to arrange this testing. If you do not hear from them within 1 week, please call the office to follow-up.  Contact information: [address] [place] [employer] [facility] [phone]           Signed: Lucille Passy [facility] 08/20/2018, 8:33 AM

## 2018-08-16 NOTE — Anesthesia Procedure Notes (Signed)
Procedure Name: LMA Insertion Date/Time: 08/16/2018 11:14 AM Performed by: Eben Burow, CRNA Pre-anesthesia Checklist: Patient identified, Emergency Drugs available, Suction available, Patient being monitored and Timeout performed Patient Re-evaluated:Patient Re-evaluated prior to induction Oxygen Delivery Method: Circle system utilized Preoxygenation: Pre-oxygenation with 100% oxygen Induction Type: IV induction Ventilation: Mask ventilation without difficulty LMA: LMA inserted Placement Confirmation: ETT inserted through vocal cords under direct vision Tube secured with: Tape Dental Injury: Teeth and Oropharynx as per pre-operative assessment

## 2018-08-16 NOTE — Anesthesia Postprocedure Evaluation (Signed)
Anesthesia Post Note  Patient: [name] [name]  Procedure(s) Performed: TOTAL KNEE ARTHROPLASTY (Left )     Patient location during evaluation: PACU Anesthesia Type: General Level of consciousness: awake and alert Pain management: pain level controlled Vital Signs Assessment: post-procedure vital signs reviewed and stable Respiratory status: spontaneous breathing, nonlabored ventilation and respiratory function stable Cardiovascular status: blood pressure returned to baseline and stable Postop Assessment: no apparent nausea or vomiting Anesthetic complications: no    Last Vitals:  Vitals:   08/16/18 1445 08/16/18 1500  BP: 116/62 126/63  Pulse: 65 68  Resp: 20 19  Temp:  (!) 36.3 C  SpO2: 100% 100%    Last Pain:  Vitals:   08/16/18 1500  TempSrc:   PainSc: 0-No pain                 Lynda Rainwater

## 2018-08-16 NOTE — Plan of Care (Signed)
progressing 

## 2018-08-16 NOTE — Progress Notes (Signed)
Assisted Dr. Rose with left, ultrasound guided, adductor canal block. Side rails up, monitors on throughout procedure. See vital signs in flow sheet. Tolerated Procedure well.  

## 2018-08-16 NOTE — Interval H&P Note (Signed)
History and Physical Interval Note:  08/16/2018 10:12 AM  [name] [name]  has presented today for surgery, with the diagnosis of Left knee osteoarthritis.  The various methods of treatment have been discussed with the patient and family. After consideration of risks, benefits and other options for treatment, the patient has consented to  Procedure(s) with comments: TOTAL KNEE ARTHROPLASTY (Left) - with adductor canal as a surgical intervention.  The patient's history has been reviewed, patient examined, no change in status, stable for surgery.  I have reviewed the patient's chart and labs.  Questions were answered to the patient's satisfaction.     Timouthy Gilardi ANDREW

## 2018-08-16 NOTE — Anesthesia Procedure Notes (Signed)
Anesthesia Procedure Image    

## 2018-08-17 MED ORDER — TRAMADOL HCL 50 MG PO TABS: 50.0000 mg | ORAL_TABLET | Freq: Four times a day (QID) | ORAL | Status: DC | PRN

## 2018-08-17 MED ORDER — OXYCODONE HCL 5 MG PO TABS: 5.0000 mg | ORAL_TABLET | ORAL | Status: DC | PRN

## 2018-08-17 NOTE — Progress Notes (Signed)
Physical Therapy Treatment Patient Details Name: [name] [name] MRN: [id number] DOB: Feb 18, 1929 Today's Date: 08/17/2018    History of Present Illness 85 yo male s/p L TKR on 08/16/18. PMH includes MVR, HLD, CAD s/p CABG, HTN.    PT Comments    Pt progressing. Requires cues for safety and to stay on task. incr amb distance/tolerance  Follow Up Recommendations  Follow surgeon's recommendation for DC plan and follow-up therapies;Supervision for mobility/OOB     Equipment Recommendations  None recommended by PT    Recommendations for Other Services       Precautions / Restrictions Precautions Precautions: Fall;Knee Restrictions Weight Bearing Restrictions: No Other Position/Activity Restrictions: WBAT    Mobility  Bed Mobility Overal bed mobility: Needs Assistance Bed Mobility: Supine to Sit     Supine to sit: Min guard;Min assist     General bed mobility comments: assist/guidance for LLE off bed  Transfers Overall transfer level: Needs assistance Equipment used: Rolling walker (2 wheeled) Transfers: Sit to/from Stand Sit to Stand: Min guard         General transfer comment: min guard for safety, verbal cuing for hand placement.  Ambulation/Gait Ambulation/Gait assistance: Min guard Gait Distance (Feet): 160 Feet Assistive device: Rolling walker (2 wheeled) Gait Pattern/deviations: Step-to pattern;Trunk flexed;Step-through pattern;Decreased stride length     General Gait Details: cues for sequence initially   Stairs             Wheelchair Mobility    Modified Rankin (Stroke Patients Only)       Balance                                            Cognition Arousal/Alertness: Awake/alert Behavior During Therapy: WFL for tasks assessed/performed Overall Cognitive Status: Within Functional Limits for tasks assessed                                        Exercises Total Joint Exercises Ankle Circles/Pumps:  AROM Quad Sets: AROM;Both;10 reps Heel Slides: AAROM;Left;10 reps Hip ABduction/ADduction: AAROM;Left;10 reps Straight Leg Raises: AAROM;Left;10 reps Goniometric ROM: grossly 6* to 75* L knee flexion AAROM    General Comments        Pertinent Vitals/Pain Pain Assessment: 0-10 Pain Score: 3  Pain Location: L knee, with ambulation Pain Descriptors / Indicators: Sore Pain Intervention(s): Limited activity within patient's tolerance;Monitored during session;Premedicated before session;Repositioned;Ice applied    Home Living                      Prior Function            PT Goals (current goals can now be found in the care plan section) Acute Rehab PT Goals Patient Stated Goal: return to active lifestyle PT Goal Formulation: With patient Time For Goal Achievement: 08/23/18 Potential to Achieve Goals: Good Progress towards PT goals: Progressing toward goals    Frequency    7X/week      PT Plan Current plan remains appropriate    Co-evaluation              AM-PAC PT "6 Clicks" Mobility   Outcome Measure  Help needed turning from your back to your side while in a flat bed without using bedrails?: A Little Help needed moving from  lying on your back to sitting on the side of a flat bed without using bedrails?: A Little Help needed moving to and from a bed to a chair (including a wheelchair)?: A Little Help needed standing up from a chair using your arms (e.g., wheelchair or bedside chair)?: A Little Help needed to walk in hospital room?: A Little Help needed climbing 3-5 steps with a railing? : A Little 6 Click Score: 18    End of Session Equipment Utilized During Treatment: Gait belt Activity Tolerance: Patient tolerated treatment well Patient left: in chair;with call bell/phone within reach;with chair alarm set Nurse Communication: Mobility status PT Visit Diagnosis: Other abnormalities of gait and mobility (R26.89);Difficulty in walking, not  elsewhere classified (R26.2)     Time: 6720-9470 PT Time Calculation (min) (ACUTE ONLY): 16 min  Charges:  $Gait Training: 8-22 mins                     Kenyon Ana, PT  Pager: [phone] Acute Rehab Dept [facility]): [phone]   08/17/2018    [facility] 08/17/2018, 11:46 AM

## 2018-08-17 NOTE — TOC Initial Note (Signed)
Transition of Care [facility]) - Initial/Assessment Note    Patient Details  Name: [name] [name] MRN: [id number] Date of Birth: Sep 22, 1928  Transition of Care (TOC) CM/SW Contact:    Joaquin Courts, RN Phone Number: 08/17/2018, 9:46 AM  Clinical Narrative:   CM spoke with patient at bedside. Patient set up with [facility] [facility] for HHPT. Patient reports he has rolling walker and declines a 3-in-1.                 Expected Discharge Plan: [place] Barriers to Discharge: Continued Medical Work up   Patient Goals and CMS Choice Patient states their goals for this hospitalization and ongoing recovery are:: to go home CMS Medicare.gov Compare Post Acute Care list provided to:: Patient Choice offered to / list presented to : Patient  Expected Discharge Plan and Services Expected Discharge Plan: [place]   Discharge Planning Services: CM Consult Post Acute Care Choice: [place] arrangements for the past 2 months: Single Family Home                 DME Arranged: N/A DME Agency: NA       HH Arranged: PT [place] Agency: [place] [facility] Date HH Agency Contacted: 08/17/18 Time [place]: [phone] Representative spoke with at [place]: Corene Cornea  Prior Living Arrangements/Services Living arrangements for the past 2 months: [place] with:: Spouse Patient language and need for interpreter reviewed:: Yes Do you feel safe going back to the place where you live?: Yes      Need for Family Participation in Patient Care: Yes (Comment) Care giver support system in place?: Yes (comment)   Criminal Activity/Legal Involvement Pertinent to Current Situation/Hospitalization: No - Comment as needed  Activities of Daily Living Home Assistive Devices/Equipment: Hearing aid, Eyeglasses ADL Screening (condition at time of admission) Patient's cognitive ability adequate to safely complete daily  activities?: Yes Is the patient deaf or have difficulty hearing?: Yes Does the patient have difficulty seeing, even when wearing glasses/contacts?: No Does the patient have difficulty concentrating, remembering, or making decisions?: No Patient able to express need for assistance with ADLs?: Yes Does the patient have difficulty dressing or bathing?: No Independently performs ADLs?: Yes (appropriate for developmental age) Does the patient have difficulty walking or climbing stairs?: Yes Weakness of Legs: None Weakness of Arms/Hands: None  Permission Sought/Granted                  Emotional Assessment Appearance:: Appears stated age Attitude/Demeanor/Rapport: Engaged Affect (typically observed): Accepting Orientation: : Oriented to Self, Oriented to Place, Oriented to  Time, Oriented to Situation   Psych Involvement: No (comment)  Admission diagnosis:  Left knee osteoarthritis Patient Active Problem List   Diagnosis Date Noted  . Osteoarthritis of left knee 08/16/2018  . S/P knee replacement 08/16/2018  . S/P TKR (total knee replacement) using cement, left 08/16/2018  . Carotid stenosis 07/28/2015  . Aortic stenosis, mild 06/05/2011  . Special screening for malignant neoplasms, colon 09/21/2010  . Irregular heart rate 09/21/2010  . S/P mitral valve repair 07/07/2010  . Coronary artery disease 07/07/2010  . DYSPNEA 01/13/2009  . MITRAL VALVE PROLAPSE, NON-RHEUMATIC 07/16/2008  . HYPERCHOLESTEROLEMIA  IIA 03/11/2008  . HYPERLIPIDEMIA-MIXED 03/11/2008  . CAD, ARTERY BYPASS GRAFT 03/11/2008   PCP:  Levin Erp, MD Pharmacy:   [place]  [place] [address] Phone: [phone] Fax: [phone]     Social Determinants of Health (SDOH) Interventions    Readmission Risk Interventions No flowsheet data found.

## 2018-08-17 NOTE — Progress Notes (Signed)
Patient very confused. Has been sitting at the nursing station since shift change and requiring practically one on one supervision.  Insisting that he has missed a doctors appointment.  Had him talk with his son in law and again with his daughter.  He needs to void and had a bladder scan of 307.  The need to void is adding to his confusion.  Trying to reach PA to get orders for a sitter as well as a foley.  Just talked with Estill Bamberg and she will put in new orders.

## 2018-08-17 NOTE — Progress Notes (Signed)
Spoke with nursing staff who states that patient is agitated, roaming the halls and into nursing station, confused. RN Film/video editor. I have placed an order for sitter. Of note, the patient did receive dilaudid this afternoon. His pain has been well controlled on oxycodone with no previous episodes of confusion noted. I have D/c dilaudid. I have added tramadol for mod pain and will use oxycodone for severe pain.  Patient has not voided on his own. He has had 2 straight caths. Recent bladder scan showed he was retaining. I have placed an order for Foley. Re-eval foley in the am.

## 2018-08-17 NOTE — Progress Notes (Signed)
In and out cath done for bladder scan off 366, and patient inablity to void. 375cc of concentrated urine returned. Bethann Punches RN

## 2018-08-17 NOTE — Progress Notes (Signed)
Home health agencies that serve [phone].        [place] Quality of Patient Care Rating Patient Survey Summary Rating  [facility] [phone] 4 out of 5 stars 4 out of [place] [phone] 3 out of 5 stars 5 out of [place] [phone] 3 out of 5 stars 4 out of [place] [phone] 4  out of 5 stars 3 out of [place] [phone]) [phone] 4 out of 5 stars 4 out of [place] [phone] 4  out of 5 stars 4 out of [place] [phone] 4 out of 5 stars 4 out of [place] [phone] 4 out of 5 stars 4 out of 5 stars  [facility] [phone] 3  out of 5 stars 4 out of [place] [phone] 3 out of 5 stars 4 out of 5 stars  [facility] ([phone]) [phone] 4 out of 5 stars Not Available12  [facility] [phone] 3  out of 5 stars 3 out of 5 stars  [facility] (336) [phone] 3  out of 5 stars 4 out of [place] [phone] 3  out of 5 stars 4 out of [place] [phone] 3  out of 5 stars 3 out of [place] [phone] 3  out of 5 stars Not [place] [phone] 3  out of 5 stars 4 out of [place] [phone] 4  out of 5 stars 3 out of [place] [phone] 4  out of 5 stars 2 out of [place] number Footnote as displayed on [place]  1 This agency provides services under a federal waiver program to non-traditional, chronic long term population.  2 This agency provides services to a  special needs population.  3 Not Available.  4 The number of patient episodes for this measure is too small to report.  5 This measure currently does not have data or provider has been certified/recertified for less than 6 months.  6 The national average for this measure is not provided because of state-to-state differences in data collection.  7 Medicare is not displaying rates for this measure for any home health agency, because of an issue with the data.  8 There were problems with the data and they are being corrected.  9 Zero, or very few, patients met the survey's rules for inclusion. The scores shown, if any, reflect a very small number of surveys and may not accurately tell how an agency is doing.  10 Survey results are based on less than 12 months of data.  11 Fewer than 70 patients completed the survey. Use the scores shown, if any, with caution as the number of surveys may be too low to accurately tell how an agency is doing.  12 No survey results are available for this period.  13 Data suppressed by CMS  for one or more quarters.

## 2018-08-17 NOTE — Progress Notes (Signed)
Physical Therapy Treatment Patient Details Name: [name] [name] MRN: [id number] DOB: 12-Apr-1928 Today's Date: 08/17/2018    History of Present Illness 85 yo male s/p L TKR on 08/16/18. PMH includes MVR, HLD, CAD s/p CABG, HTN.    PT Comments    Pt progressing toward PT goals; continue POC.    Follow Up Recommendations  Follow surgeon's recommendation for DC plan and follow-up therapies;Supervision for mobility/OOB     Equipment Recommendations  None recommended by PT    Recommendations for Other Services       Precautions / Restrictions Precautions Precautions: Fall;Knee Restrictions Weight Bearing Restrictions: No Other Position/Activity Restrictions: WBAT    Mobility  Bed Mobility Overal bed mobility: Needs Assistance Bed Mobility: Sit to Supine     Supine to sit: Min guard;Min assist Sit to supine: Min assist   General bed mobility comments: assist with LLE  Transfers Overall transfer level: Needs assistance Equipment used: Rolling walker (2 wheeled) Transfers: Sit to/from Stand Sit to Stand: Min guard         General transfer comment: min guard for safety, verbal cuing for hand placement.  Ambulation/Gait Ambulation/Gait assistance: Min guard Gait Distance (Feet): 180 Feet Assistive device: Rolling walker (2 wheeled) Gait Pattern/deviations: Step-to pattern;Trunk flexed;Step-through pattern;Decreased stride length     General Gait Details: cues for sequence initially   Stairs             Wheelchair Mobility    Modified Rankin (Stroke Patients Only)       Balance Overall balance assessment: Mild deficits observed, not formally tested;History of Falls                                          Cognition Arousal/Alertness: Awake/alert Behavior During Therapy: WFL for tasks assessed/performed Overall Cognitive Status: Within Functional Limits for tasks assessed                                         Exercises Total Joint Exercises Ankle Circles/Pumps: AROM Quad Sets: AROM;Both;10 reps Heel Slides: AAROM;Left;10 reps Hip ABduction/ADduction: AAROM;Left;10 reps Straight Leg Raises: AAROM;Left;10 reps Goniometric ROM: grossly 6* to 75* L knee flexion AAROM    General Comments        Pertinent Vitals/Pain Pain Assessment: 0-10 Pain Score: 3  Pain Location: L knee, with ambulation Pain Descriptors / Indicators: Sore Pain Intervention(s): Limited activity within patient's tolerance;Monitored during session;Premedicated before session;Repositioned;Ice applied    Home Living                      Prior Function            PT Goals (current goals can now be found in the care plan section) Acute Rehab PT Goals Patient Stated Goal: return to active lifestyle PT Goal Formulation: With patient Time For Goal Achievement: 08/23/18 Potential to Achieve Goals: Good Progress towards PT goals: Progressing toward goals    Frequency    7X/week      PT Plan Current plan remains appropriate    Co-evaluation              AM-PAC PT "6 Clicks" Mobility   Outcome Measure  Help needed turning from your back to your side while in a flat bed without using bedrails?: A  Little Help needed moving from lying on your back to sitting on the side of a flat bed without using bedrails?: A Little Help needed moving to and from a bed to a chair (including a wheelchair)?: A Little Help needed standing up from a chair using your arms (e.g., wheelchair or bedside chair)?: A Little Help needed to walk in hospital room?: A Little Help needed climbing 3-5 steps with a railing? : A Little 6 Click Score: 18    End of Session Equipment Utilized During Treatment: Gait belt Activity Tolerance: Patient tolerated treatment well Patient left: with call bell/phone within reach;in bed;with bed alarm set Nurse Communication: Mobility status PT Visit Diagnosis: Other abnormalities of gait and  mobility (R26.89);Difficulty in walking, not elsewhere classified (R26.2)     Time: 7494-4967 PT Time Calculation (min) (ACUTE ONLY): 22 min  Charges:  $Gait Training: 8-22 mins                     Drucilla Chalet, PT  Pager: [phone] Acute Rehab Dept [facility]): [phone]   08/17/2018    [facility] 08/17/2018, 1:36 PM

## 2018-08-17 NOTE — Progress Notes (Signed)
Subjective: 1 Day Post-Op Procedure(s) (LRB): TOTAL KNEE ARTHROPLASTY (Left) Patient reports pain as mild.   Foley removed, due to void -Flatus, -BM; no abdominal pain nor distention.  Has been up and ambulating with PT.  Tolerating PO intake without N/V. Denies HA, dizziness, CP, SOB, calf pain.   Objective: Vital signs in last 24 hours: Temp:  [97.4 F (36.3 C)-97.6 F (36.4 C)] 97.6 F (36.4 C) (06/20 0505) Pulse Rate:  [31-78] 61 (06/20 0505) Resp:  [12-26] 16 (06/20 0505) BP: (103-149)/(48-86) 103/48 (06/20 0505) SpO2:  [90 %-100 %] 100 % (06/20 0505) Weight:  [60.1 kg] 60.1 kg (06/19 1525)  Intake/Output from previous day: 06/19 0701 - 06/20 0700 In: 3064.1 [P.O.:480; I.V.:2584.1] Out: 1350 [Urine:1110; Drains:190; Blood:50] Intake/Output this shift: No intake/output data recorded.  Recent Labs    08/16/18 1608  HGB 10.0*   Recent Labs    08/16/18 1608  WBC 6.9  RBC 3.20*  HCT 32.2*  PLT 183   Recent Labs    08/16/18 1608  CREATININE 1.02   No results for input(s): LABPT, INR in the last 72 hours.  Neurologically intact ABD soft Neurovascular intact Sensation intact distally Intact pulses distally Dorsiflexion/Plantar flexion intact Incision: moderate drainage No cellulitis present Compartment soft   Dressing is partially soaked with dry blood. No signs of ongoing bleeding or infection.  Drain was removed today without difficulty.   Assessment/Plan: 1 Day Post-Op Procedure(s) (LRB): TOTAL KNEE ARTHROPLASTY (Left)   DVT PPx: TEDs, SCDs, ambulation Encourage IS Advance diet Up with therapy Plan for discharge tomorrow if +flatus, +void   Anticipated LOS equal to or greater than 2 midnights due to - Age 83 and older with one or more of the following:  - Expected need for hospital services (PT, OT, Nursing) required for safe  discharge  - Anticipated need for postoperative skilled nursing care or inpatient rehab  - Active co-morbidities:  None OR   - Unanticipated findings during/Post Surgery: None  - Patient is a high risk of re-admission due to: None    Yvonne Kendall Ward 08/17/2018, 8:31 AM

## 2018-08-18 ENCOUNTER — Inpatient Hospital Stay (HOSPITAL_COMMUNITY): Payer: Medicare Other

## 2018-08-18 DIAGNOSIS — I4891 Unspecified atrial fibrillation: Secondary | ICD-10-CM

## 2018-08-18 DIAGNOSIS — M1712 Unilateral primary osteoarthritis, left knee: Principal | ICD-10-CM

## 2018-08-18 DIAGNOSIS — R4182 Altered mental status, unspecified: Secondary | ICD-10-CM

## 2018-08-18 LAB — CBC WITH DIFFERENTIAL/PLATELET
Abs Immature Granulocytes: 0.02 10*3/uL (ref 0.00–0.07)
Basophils Absolute: 0 10*3/uL (ref 0.0–0.1)
Basophils Relative: 0 %
Eosinophils Absolute: 0.2 10*3/uL (ref 0.0–0.5)
Eosinophils Relative: 3 %
HCT: 26.1 % — ABNORMAL LOW (ref 39.0–52.0)
Hemoglobin: 8.4 g/dL — ABNORMAL LOW (ref 13.0–17.0)
Immature Granulocytes: 0 %
Lymphocytes Relative: 8 %
Lymphs Abs: 0.5 10*3/uL — ABNORMAL LOW (ref 0.7–4.0)
MCH: 31.7 pg (ref 26.0–34.0)
MCHC: 32.2 g/dL (ref 30.0–36.0)
MCV: 98.5 fL (ref 80.0–100.0)
Monocytes Absolute: 0.6 10*3/uL (ref 0.1–1.0)
Monocytes Relative: 9 %
Neutro Abs: 5.2 10*3/uL (ref 1.7–7.7)
Neutrophils Relative %: 80 %
Platelets: 162 10*3/uL (ref 150–400)
RBC: 2.65 MIL/uL — ABNORMAL LOW (ref 4.22–5.81)
RDW: 14 % (ref 11.5–15.5)
WBC: 6.5 10*3/uL (ref 4.0–10.5)
nRBC: 0 % (ref 0.0–0.2)

## 2018-08-18 LAB — BASIC METABOLIC PANEL
Anion gap: 12 (ref 5–15)
BUN: 20 mg/dL (ref 8–23)
CO2: 19 mmol/L — ABNORMAL LOW (ref 22–32)
Calcium: 8.7 mg/dL — ABNORMAL LOW (ref 8.9–10.3)
Chloride: 104 mmol/L (ref 98–111)
Creatinine, Ser: 1.12 mg/dL (ref 0.61–1.24)
GFR calc Af Amer: >60 mL/min (ref >60)
GFR calc non Af Amer: 59 mL/min — ABNORMAL LOW (ref >60)
Glucose, Bld: 144 mg/dL — ABNORMAL HIGH (ref 70–99)
Potassium: 4.2 mmol/L (ref 3.5–5.1)
Sodium: 135 mmol/L (ref 135–145)

## 2018-08-18 LAB — URINALYSIS, ROUTINE W REFLEX MICROSCOPIC
Bacteria, UA: NONE SEEN
Bilirubin Urine: NEGATIVE
Glucose, UA: NEGATIVE mg/dL
Ketones, ur: NEGATIVE mg/dL
Nitrite: NEGATIVE
Protein, ur: 30 mg/dL — AB
RBC / HPF: >50 RBC/hpf — ABNORMAL HIGH (ref 0–5)
Specific Gravity, Urine: 1.013 (ref 1.005–1.030)
pH: 7 (ref 5.0–8.0)

## 2018-08-18 MED ORDER — HALOPERIDOL LACTATE 5 MG/ML IJ SOLN: 1.0000 mg | Freq: Four times a day (QID) | INTRAMUSCULAR | Status: DC | PRN

## 2018-08-18 MED ORDER — QUETIAPINE FUMARATE 25 MG PO TABS
25.0000 mg | ORAL_TABLET | Freq: Every day | ORAL | Status: DC
  Administered 2018-08-18: 25 mg via ORAL
  Filled 2018-08-18: qty 1

## 2018-08-18 NOTE — Progress Notes (Signed)
Patient confused at desk in chair, staff trying to keep him in chair patient is hitting at nurses, securtiy called. M babish notified per joanne crowley restraint order obtained. Patient taken to room and wrist restraints applied. Daughter denise called spoke with her. Bethann Punches RN

## 2018-08-18 NOTE — Plan of Care (Signed)
Patient moves very well, but is pleasantly confused and insisting that he is going home tonight.   He has tried multiple times to call home for someone to come and get him.   He will not listen to what I am trying to tell him.   We had a sitter for a few hours but he was doing so well then, that I cancelled the next person, who then, did not answer the phone when I tried to call them back in as he escalated.   At present he has not slept at all.

## 2018-08-18 NOTE — Consult Note (Addendum)
Medical Consultation   [name] [name]  WVP:[id number]  DOB: [birth year]  DOA: 08/16/2018  PCP: Nila Nephew, MD  Outpatient Specialists:    Requesting physician:Matthew Babish,PA-C   Reason for consultation: Altered mental status   History of Present Illness: [name] [name] is an 84 y.o. male with past medical history of osteoarthritis of the left knee, hypertension,Coronary disease, hyperlipidemia who was admitted here for left total knee arthroplasty.  Postop day 2. Medicine consulted today for the evaluation of agitation, confusion, altered mental status.  It was reported that he was walking on the hallway, hitting, fighting with the nurses. Patient seen and examined the bedside today.  Currently is hemodynamically stable.  He is alert, oriented to person and time but not place.  RN reported me that he was walking on the hallway last night and sitter had to be placed. Further information was also gathered from her daughter, Angelique Blonder.  She says she is very active physically and mentally.  He has his own business.  He was never confused in the past.  He does not have any history of dementia.  He has history of hypertension, hyperlipidemia, coronary artery disease, mitral valve repair. Patient denied any fever, chills, shortness of breath, chest pain, palpitation, abdomen pain, nausea, vomiting or diarrhea.   Review of Systems:  As per HPI otherwise 10 point review of systems negative.   Past Medical History: Past Medical History:  Diagnosis Date  . Acute MI (HCC)   . Coronary artery disease   . Diverticulosis 06-12-2003   Colonoscopy  . History of kidney stones   . Hyperlipidemia   . Hypertension   . MVP (mitral valve prolapse)   . Undiagnosed cardiac murmurs   . Urinary retention     Past Surgical History: Past Surgical History:  Procedure Laterality Date  . CARDIAC CATHETERIZATION    . CORONARY ANGIOPLASTY    . MITRAL VALVE REPAIR     2/09     Allergies:   No Known Allergies   Social History:  reports that he has never smoked. He has never used smokeless tobacco. He reports current alcohol use. He reports that he does not use drugs.   Family History: Family History  Problem Relation Age of Onset  . Hypertension Other   . Colon cancer Neg Hx       Physical Exam: Vitals:   08/17/18 0927 08/17/18 1333 08/17/18 2044 08/18/18 0615  BP: 109/61 (!) 103/51 (!) 130/56 (!) 126/49  Pulse: (!) 57 61 70 76  Resp: 14 16 18 20   Temp: 97.9 F (36.6 C) 98 F (36.7 C) 98.5 F (36.9 C) 99.7 F (37.6 C)  TempSrc:  Oral Oral Oral  SpO2: 100% 99% 98% 99%  Weight:      Height:        Constitutional:   Alert and awake, oriented x2, not in any acute distress. Eyes: PERLA, EOMI, irises appear normal, anicteric sclera,  ENMT: external ears and nose appear normal Neck: neck appears normal, no masses, normal ROM, no thyromegaly, no JVD  CVS: S1-S2 clear, no murmur rubs or gallops, no LE edema, normal pedal pulses  Respiratory:  clear to auscultation bilaterally, no wheezing, rales or rhonchi. Respiratory effort normal. No accessory muscle use.  Abdomen: soft nontender, nondistended, normal bowel sounds, no hepatosplenomegaly, no hernias  Musculoskeletal: : no cyanosis, clubbing or edema noted bilaterally.  Left knee wrapped with dressing  Neuro: Cranial nerves II-XII intact, strength, sensation,  Psych: Alert, awake, actively engaged in conversation, not oriented to place but oriented to time and person. Skin: Ecchymosis on his bilateral forearms, senile purpura   Data reviewed:  I have personally reviewed following labs and imaging studies Labs:  CBC: Recent Labs  Lab 08/13/18 1220 08/16/18 1608  WBC 3.6* 6.9  HGB 11.0* 10.0*  HCT 34.9* 32.2*  MCV 99.1 100.6*  PLT 214 607    Basic Metabolic Panel: Recent Labs  Lab 08/13/18 1220 08/16/18 1608  NA 139  --   K 4.8  --   CL 105  --   CO2 24  --   GLUCOSE 108*  --   BUN 27*  --    CREATININE 1.14 1.02  CALCIUM 9.4  --    GFR Estimated Creatinine Clearance: 43.4 mL/min (by C-G formula based on SCr of 1.02 mg/dL). Liver Function Tests: No results for input(s): AST, ALT, ALKPHOS, BILITOT, PROT, ALBUMIN in the last 168 hours. No results for input(s): LIPASE, AMYLASE in the last 168 hours. No results for input(s): AMMONIA in the last 168 hours. Coagulation profile No results for input(s): INR, PROTIME in the last 168 hours.  Cardiac Enzymes: No results for input(s): CKTOTAL, CKMB, CKMBINDEX, TROPONINI in the last 168 hours. BNP: Invalid input(s): POCBNP CBG: No results for input(s): GLUCAP in the last 168 hours. D-Dimer No results for input(s): DDIMER in the last 72 hours. Hgb A1c No results for input(s): HGBA1C in the last 72 hours. Lipid Profile No results for input(s): CHOL, HDL, LDLCALC, TRIG, CHOLHDL, LDLDIRECT in the last 72 hours. Thyroid function studies No results for input(s): TSH, T4TOTAL, T3FREE, THYROIDAB in the last 72 hours.  Invalid input(s): FREET3 Anemia work up No results for input(s): VITAMINB12, FOLATE, FERRITIN, TIBC, IRON, RETICCTPCT in the last 72 hours. Urinalysis    Component Value Date/Time   COLORURINE YELLOW 09/16/2016 1044   APPEARANCEUR CLEAR 09/16/2016 1044   LABSPEC 1.009 09/16/2016 1044   PHURINE 5.0 09/16/2016 1044   GLUCOSEU NEGATIVE 09/16/2016 1044   HGBUR MODERATE (A) 09/16/2016 1044   BILIRUBINUR NEGATIVE 09/16/2016 1044   KETONESUR NEGATIVE 09/16/2016 1044   PROTEINUR NEGATIVE 09/16/2016 1044   UROBILINOGEN 1.0 04/05/2007 0939   NITRITE NEGATIVE 09/16/2016 1044   LEUKOCYTESUR NEGATIVE 09/16/2016 1044     Microbiology Recent Results (from the past 240 hour(s))  Surgical pcr screen     Status: None   Collection Time: 08/13/18 12:20 PM   Specimen: Nasal Mucosa; Nasal Swab  Result Value Ref Range Status   MRSA, PCR NEGATIVE NEGATIVE Final   Staphylococcus aureus NEGATIVE NEGATIVE Final    Comment: (NOTE)  The Xpert SA Assay (FDA approved for NASAL specimens in patients 8 years of age and older), is one component of a comprehensive surveillance program. It is not intended to diagnose infection nor to guide or monitor treatment. Performed at [facility], [place] [address], [place], [place] [address]   Novel Coronavirus, NAA (hospital order; send-out to ref lab)     Status: None   Collection Time: 08/13/18 12:50 PM   Specimen: Nasopharyngeal Swab; Respiratory  Result Value Ref Range Status   SARS-CoV-2, NAA NOT DETECTED NOT DETECTED Final    Comment: (NOTE) This test was developed and its performance characteristics determined by [employer]. This test has not been FDA cleared or approved. This test has been authorized by FDA under an Emergency Use Authorization (EUA). This test is only authorized for  the duration of time the declaration that circumstances exist justifying the authorization of the emergency use of in vitro diagnostic tests for detection of SARS-CoV-2 virus and/or diagnosis of COVID-19 infection under section 564(b)(1) of the Act, 21 U.S.C. 161WRU-0(A)(5360bbb-3(b)(1), unless the authorization is terminated or revoked sooner. When diagnostic testing is negative, the possibility of a false negative result should be considered in the context of a patient's recent exposures and the presence of clinical signs and symptoms consistent with COVID-19. An individual without symptoms of COVID-19 and who is not shedding SARS-CoV-2 virus would expect to have a negative (not detected) result in this assay. Performed  At: [facility] [address] [place], [place] [id number] Jolene SchimkeNagendra Sanjai MD NW:[id number]:[phone]    Coronavirus Source NASOPHARYNGEAL  Final    Comment: Performed at [facility], [address] [place], [place] [address]       Inpatient Medications:   Scheduled Meds: . clopidogrel  75 mg Oral Daily  . docusate sodium  100 mg Oral BID   . furosemide  20 mg Oral Daily  . lisinopril  10 mg Oral Daily  . multivitamin with minerals  1 tablet Oral Daily  . QUEtiapine  25 mg Oral QHS  . simvastatin  40 mg Oral Daily  . tamsulosin  0.4 mg Oral Daily   Continuous Infusions: . dextrose 5 % and 0.45% NaCl Stopped (08/17/18 1247)  . methocarbamol (ROBAXIN) IV       Radiological Exams on Admission: No results found.  Impression/Recommendations Active Problems:   Osteoarthritis of left knee   S/P knee replacement   S/P TKR (total knee replacement) using cement, left    Altered mental status: Most likely this is secondary to delirium.  Patient just had surgery.  It is not unusual to see delirium in this elderly patient who is in a new environment and mainly after surgery.  He was also given IV pain meds, Dilaudid, yesterday. I will recommend to minimize narcotics, benzodiazepines as much as possible.  Continue Haldol at the lowest possible dose as needed for agitation. I will start him on low-dose Seroquel.  Will check EKG for QT interval. I will check CBC, BMP, UA and CT head.  Normocytic anemia: Hemoglobin this morning 8.4.  Hemoglobin was 11 on 6/16.  Most likely this is from acute blood loss anemia secondary to surgery.  No need of transfusion unless hemoglobin less than 7.  Check CBC tomorrow.  History of coronary artery disease: Currently stable.  Status post coronary artery bypass graft in 2009.Marland Kitchen. On Plavix, statin.  History of acute MI in 2006 with V. fib arrest.  History of mitral valve regurgitation: Status post mitral valve repair in 2009.  Follows with cardiology.  Last echocardiogram done in 5/20 showed ejection fraction of 50 to 55%.  Hyperlipidemia: Continue statin  Hypertension: Currently blood pressure stable.  Continue current medications  BPH: Continue Flomax  Total left knee replacement: Management as per orthopedics.  Pain looks well controlled at present.  Addendum: EKG shows A. fib.  Heart rate is  currently controlled.  He did not have any history of A. fib in the past. CHA2DS2VASc score of 4.  Initiating anticoagulation in this elderly male is challenging.  Will request for cardiology consult.I paged cardiology,awaitng for call. Will follow up.  Thank you for this consultation.  [facility] team will follow the patient with you.   Time Spent: 55 mins  Burnadette PopAmrit Sharlize Hoar M.D. [facility] [phone] 08/18/2018, 8:48 AM

## 2018-08-18 NOTE — Progress Notes (Signed)
Physical Therapy Treatment Patient Details Name: [name] [name] MRN: [id number] DOB: 1928-05-19 Today's Date: 08/18/2018    History of Present Illness 85 yo male s/p L TKR on 08/16/18. PMH includes MVR, HLD, CAD s/p CABG, HTN.    PT Comments    Pt pleasantly confused, cooperative but requiring cues for safety throughout. Completed hallway amb. Pt not ready for d/c d/t  Issues with confusion today.  Deferred exercises d/t knee swelling. Continue PT POC as tolerated  Follow Up Recommendations  Follow surgeon's recommendation for DC plan and follow-up therapies;Supervision for mobility/OOB     Equipment Recommendations  None recommended by PT    Recommendations for Other Services       Precautions / Restrictions Precautions Precautions: Fall;Knee Restrictions Weight Bearing Restrictions: No    Mobility  Bed Mobility Overal bed mobility: Needs Assistance Bed Mobility: Supine to Sit;Sit to Supine     Supine to sit: Min assist Sit to supine: Min assist   General bed mobility comments: assist with LLE, cues for safety  Transfers Overall transfer level: Needs assistance Equipment used: Rolling walker (2 wheeled) Transfers: Sit to/from Stand Sit to Stand: Min assist         General transfer comment: min assist to rise and steady, cues for hand placement and safety   Ambulation/Gait Ambulation/Gait assistance: Min guard;Min assist Gait Distance (Feet): 180 Feet Assistive device: Rolling walker (2 wheeled) Gait Pattern/deviations: Step-to pattern;Trunk flexed Gait velocity: decr   General Gait Details: cues for sequencing and RW safety    Stairs             Wheelchair Mobility    Modified Rankin (Stroke Patients Only)       Balance                                            Cognition Arousal/Alertness: Awake/alert Behavior During Therapy: WFL for tasks assessed/performed Overall Cognitive Status: Impaired/Different from  baseline Area of Impairment: Orientation;Following commands;Problem solving;Safety/judgement                 Orientation Level: Disoriented to;Place;Situation     Following Commands: Follows one step commands consistently Safety/Judgement: Decreased awareness of safety;Decreased awareness of deficits   Problem Solving: Slow processing;Difficulty sequencing;Requires verbal cues;Requires tactile cues General Comments: pt is confused today, was combative with the nurses last night. curently he is pleasant and able to follow functional commands      Exercises      General Comments        Pertinent Vitals/Pain Pain Assessment: 0-10 Pain Score: 4  Pain Location: L knee, with ambulation Pain Descriptors / Indicators: Sore Pain Intervention(s): Limited activity within patient's tolerance;Monitored during session;Repositioned    Home Living                      Prior Function            PT Goals (current goals can now be found in the care plan section) Acute Rehab PT Goals Patient Stated Goal: return to active lifestyle PT Goal Formulation: With patient Time For Goal Achievement: 08/23/18 Potential to Achieve Goals: Good Progress towards PT goals: Progressing toward goals    Frequency    7X/week      PT Plan Current plan remains appropriate    Co-evaluation  AM-PAC PT "6 Clicks" Mobility   Outcome Measure  Help needed turning from your back to your side while in a flat bed without using bedrails?: A Little Help needed moving from lying on your back to sitting on the side of a flat bed without using bedrails?: A Little Help needed moving to and from a bed to a chair (including a wheelchair)?: A Little Help needed standing up from a chair using your arms (e.g., wheelchair or bedside chair)?: A Little Help needed to walk in hospital room?: A Little Help needed climbing 3-5 steps with a railing? : A Little 6 Click Score: 18    End  of Session Equipment Utilized During Treatment: Gait belt Activity Tolerance: Patient tolerated treatment well Patient left: with call bell/phone within reach;in bed;with bed alarm set Nurse Communication: Mobility status PT Visit Diagnosis: Other abnormalities of gait and mobility (R26.89);Difficulty in walking, not elsewhere classified (R26.2)     Time: 1210-1228 PT Time Calculation (min) (ACUTE ONLY): 18 min  Charges:  $Gait Training: 8-22 mins                     Kenyon Ana, PT  Pager: [phone] Acute Rehab Dept [facility]): [phone]   08/18/2018    [facility] 08/18/2018, 12:51 PM

## 2018-08-18 NOTE — Progress Notes (Signed)
     Subjective: 2 Days Post-Op Procedure(s) (LRB): TOTAL KNEE ARTHROPLASTY (Left)   Patient is expressing no significant pain. He is struggling with the nurses this morning.  Reports this morning of hitting, swing and and fighting with nurses. Nurse have asked for medicine to help with his condition.  Medical consult placed.   Objective:   VITALS:   Vitals:   08/17/18 2044 08/18/18 0615  BP: (!) 130/56 (!) 126/49  Pulse: 70 76  Resp: 18 20  Temp: 98.5 F (36.9 C) 99.7 F (37.6 C)  SpO2: 98% 99%    Neurovascular intact Dorsiflexion/Plantar flexion intact  LABS Recent Labs    08/16/18 1608  HGB 10.0*  HCT 32.2*  WBC 6.9  PLT 183    Recent Labs    08/16/18 1608  CREATININE 1.02     Assessment/Plan: 2 Days Post-Op Procedure(s) (LRB): TOTAL KNEE ARTHROPLASTY (Left)  Up with therapy if the patient's mind clears and is able to participate Discharge home eventually when ready Medical consult order and appreciate their assistance in treating what appears to be post-op delerium    West Pugh. Anjulie Dipierro   PAC  08/18/2018, 8:50 AM

## 2018-08-18 NOTE — Progress Notes (Signed)
08/18/18 1600  PT Visit Information  Assistance Needed +1  History of Present Illness 85 yo male s/p L TKR on 08/16/18. PMH includes MVR, HLD, CAD s/p CABG, HTN.  Subjective Data  Patient Stated Goal return to active lifestyle  Precautions  Precautions Fall;Knee  Restrictions  Weight Bearing Restrictions No  Pain Assessment  Pain Assessment 0-10  Pain Score 5  Pain Location L knee, with ambulation  Pain Descriptors / Indicators Sore  Pain Intervention(s) Limited activity within patient's tolerance;Monitored during session;Repositioned  Cognition  Arousal/Alertness Awake/alert  Behavior During Therapy WFL for tasks assessed/performed  Overall Cognitive Status Impaired/Different from baseline  Area of Impairment Orientation;Problem solving;Safety/judgement;Following commands;Memory  Orientation Level Disoriented to;Place;Situation  Memory Decreased short-term memory  Following Commands Follows one step commands consistently  Safety/Judgement Decreased awareness of safety;Decreased awareness of deficits  Problem Solving Difficulty sequencing;Requires verbal cues;Requires tactile cues  General Comments cooperative this pm  Bed Mobility  Overal bed mobility Needs Assistance  Bed Mobility Supine to Sit;Sit to Supine  Supine to sit Min assist  Sit to supine Min assist  General bed mobility comments assist with LLE, cues for safety  Transfers  Overall transfer level Needs assistance  Equipment used Rolling walker (2 wheeled)  Transfers Sit to/from Stand  Sit to Stand Min assist  General transfer comment min assist to rise and steady, cues for hand placement and safety   Ambulation/Gait  Ambulation/Gait assistance Min guard;Min assist  Gait Distance (Feet) 200 Feet  Assistive device Rolling walker (2 wheeled)  Gait Pattern/deviations Step-to pattern;Trunk flexed  General Gait Details cues for sequencing and RW safety   Gait velocity decr  Exercises  Exercises  (deferred  exercises d/t knee swelling)  Total Joint Exercises  Ankle Circles/Pumps AROM;10 reps;Both  Quad Sets AROM;Both;10 reps  Heel Slides AAROM;Left;10 reps  Hip ABduction/ADduction AAROM;Left;10 reps  Straight Leg Raises AAROM;Left;10 reps  Goniometric ROM 10* to 65* left knee flexion  PT - End of Session  Equipment Utilized During Treatment Gait belt  Activity Tolerance Patient tolerated treatment well  Patient left with call bell/phone within reach;in bed;with bed alarm set  Nurse Communication Mobility status   PT - Assessment/Plan  PT Plan Current plan remains appropriate  PT Visit Diagnosis Other abnormalities of gait and mobility (R26.89);Difficulty in walking, not elsewhere classified (R26.2)  PT Frequency (ACUTE ONLY) 7X/week  Follow Up Recommendations Follow surgeon's recommendation for DC plan and follow-up therapies;Supervision for mobility/OOB  PT equipment None recommended by PT  AM-PAC PT "6 Clicks" Mobility Outcome Measure (Version 2)  Help needed turning from your back to your side while in a flat bed without using bedrails? 3  Help needed moving from lying on your back to sitting on the side of a flat bed without using bedrails? 3  Help needed moving to and from a bed to a chair (including a wheelchair)? 3  Help needed standing up from a chair using your arms (e.g., wheelchair or bedside chair)? 3  Help needed to walk in hospital room? 3  Help needed climbing 3-5 steps with a railing?  3  6 Click Score 18  Consider Recommendation of Discharge To: Home with [facility]  Acute Rehab PT Goals  PT Goal Formulation With patient  Time For Goal Achievement 08/23/18  Potential to Achieve Goals Good  PT Time Calculation  PT Start Time (ACUTE ONLY) 1604  PT Stop Time (ACUTE ONLY) 1632  PT Time Calculation (min) (ACUTE ONLY) 28 min  PT General Charges  $$  ACUTE PT VISIT 1 Visit  PT Treatments  $Gait Training 8-22 mins  $Therapeutic Exercise 8-22 mins

## 2018-08-19 ENCOUNTER — Encounter (HOSPITAL_COMMUNITY): Payer: Self-pay | Admitting: Specialist

## 2018-08-19 ENCOUNTER — Other Ambulatory Visit: Payer: Self-pay | Admitting: Medical

## 2018-08-19 DIAGNOSIS — D649 Anemia, unspecified: Secondary | ICD-10-CM

## 2018-08-19 DIAGNOSIS — I4891 Unspecified atrial fibrillation: Secondary | ICD-10-CM

## 2018-08-19 DIAGNOSIS — I499 Cardiac arrhythmia, unspecified: Secondary | ICD-10-CM

## 2018-08-19 DIAGNOSIS — Z96652 Presence of left artificial knee joint: Secondary | ICD-10-CM

## 2018-08-19 LAB — CBC WITH DIFFERENTIAL/PLATELET
Abs Immature Granulocytes: 0.02 10*3/uL (ref 0.00–0.07)
Basophils Absolute: 0 10*3/uL (ref 0.0–0.1)
Basophils Relative: 1 %
Eosinophils Absolute: 0 10*3/uL (ref 0.0–0.5)
Eosinophils Relative: 0 %
HCT: 23.2 % — ABNORMAL LOW (ref 39.0–52.0)
Hemoglobin: 7.3 g/dL — ABNORMAL LOW (ref 13.0–17.0)
Immature Granulocytes: 0 %
Lymphocytes Relative: 18 %
Lymphs Abs: 1 10*3/uL (ref 0.7–4.0)
MCH: 31.2 pg (ref 26.0–34.0)
MCHC: 31.5 g/dL (ref 30.0–36.0)
MCV: 99.1 fL (ref 80.0–100.0)
Monocytes Absolute: 0.6 10*3/uL (ref 0.1–1.0)
Monocytes Relative: 11 %
Neutro Abs: 3.9 10*3/uL (ref 1.7–7.7)
Neutrophils Relative %: 70 %
Platelets: 141 10*3/uL — ABNORMAL LOW (ref 150–400)
RBC: 2.34 MIL/uL — ABNORMAL LOW (ref 4.22–5.81)
RDW: 14.1 % (ref 11.5–15.5)
WBC: 5.6 10*3/uL (ref 4.0–10.5)
nRBC: 0 % (ref 0.0–0.2)

## 2018-08-19 LAB — BASIC METABOLIC PANEL
Anion gap: 9 (ref 5–15)
BUN: 21 mg/dL (ref 8–23)
CO2: 22 mmol/L (ref 22–32)
Calcium: 8.3 mg/dL — ABNORMAL LOW (ref 8.9–10.3)
Chloride: 104 mmol/L (ref 98–111)
Creatinine, Ser: 1.21 mg/dL (ref 0.61–1.24)
GFR calc Af Amer: >60 mL/min (ref >60)
GFR calc non Af Amer: 54 mL/min — ABNORMAL LOW (ref >60)
Glucose, Bld: 178 mg/dL — ABNORMAL HIGH (ref 70–99)
Potassium: 3.6 mmol/L (ref 3.5–5.1)
Sodium: 135 mmol/L (ref 135–145)

## 2018-08-19 LAB — HEMOGLOBIN AND HEMATOCRIT, BLOOD
HCT: 24 % — ABNORMAL LOW (ref 39.0–52.0)
Hemoglobin: 7.5 g/dL — ABNORMAL LOW (ref 13.0–17.0)

## 2018-08-19 MED ORDER — SULFAMETHOXAZOLE-TRIMETHOPRIM 800-160 MG PO TABS: 1.0000 | ORAL_TABLET | Freq: Two times a day (BID) | ORAL | 0 refills | Status: DC

## 2018-08-19 MED ORDER — POLYETHYLENE GLYCOL 3350 17 G PO PACK: 17.0000 g | PACK | Freq: Two times a day (BID) | ORAL | 0 refills | Status: DC

## 2018-08-19 MED ORDER — DOCUSATE SODIUM 100 MG PO CAPS: 100.0000 mg | ORAL_CAPSULE | Freq: Two times a day (BID) | ORAL | 0 refills | Status: DC

## 2018-08-19 MED ORDER — FERROUS SULFATE 325 (65 FE) MG PO TABS: 325.0000 mg | ORAL_TABLET | Freq: Three times a day (TID) | ORAL | 3 refills | Status: DC

## 2018-08-19 NOTE — Discharge Instructions (Signed)

## 2018-08-19 NOTE — Progress Notes (Signed)
Physical Therapy Treatment Patient Details Name: [name] [name] MRN: [id number] DOB: [birth year] Today's Date: 08/19/2018    History of Present Illness 85 yo male s/p L TKR on 08/16/18. PMH includes MVR, HLD, CAD s/p CABG, HTN.    PT Comments    Pt ambulated in hallway and practiced steps.  Pt also performed LE exercises and provided with HEP handout. Pt ready to d/c home today.    Follow Up Recommendations  Follow surgeon's recommendation for DC plan and follow-up therapies;Supervision for mobility/OOB     Equipment Recommendations  None recommended by PT    Recommendations for Other Services       Precautions / Restrictions Precautions Precautions: Fall;Knee Restrictions Weight Bearing Restrictions: No Other Position/Activity Restrictions: WBAT    Mobility  Bed Mobility Overal bed mobility: Needs Assistance Bed Mobility: Supine to Sit;Sit to Supine     Supine to sit: Min guard Sit to supine: Min guard   General bed mobility comments: min/guard for safety  Transfers Overall transfer level: Needs assistance Equipment used: Rolling walker (2 wheeled) Transfers: Sit to/from Stand Sit to Stand: Min guard         General transfer comment: cues for hand placement and safety  Ambulation/Gait Ambulation/Gait assistance: Min guard Gait Distance (Feet): 200 Feet Assistive device: Rolling walker (2 wheeled) Gait Pattern/deviations: Step-to pattern;Decreased stance time - left;Antalgic     General Gait Details: cues for sequencing and RW safety    Stairs Stairs: Yes Stairs assistance: Min guard Stair Management: Step to pattern;Forwards;Two rails Number of Stairs: 2 General stair comments: verbal cues for sequence and safety, performed twice, pt reports understanding   Wheelchair Mobility    Modified Rankin (Stroke Patients Only)       Balance                                            Cognition Arousal/Alertness:  Awake/alert Behavior During Therapy: WFL for tasks assessed/performed Overall Cognitive Status: Within Functional Limits for tasks assessed                                 General Comments: pleasant, orientated and cooperative today      Exercises Total Joint Exercises Ankle Circles/Pumps: AROM;10 reps;Both Quad Sets: AROM;Both;10 reps Short Arc Quad: AROM;Left;10 reps Heel Slides: AAROM;Left;10 reps Hip ABduction/ADduction: Left;10 reps;AROM Straight Leg Raises: Left;10 reps;AROM    General Comments        Pertinent Vitals/Pain Pain Assessment: 0-10 Pain Score: 2  Pain Location: L knee Pain Descriptors / Indicators: Sore;Aching;Tightness Pain Intervention(s): Monitored during session;Repositioned;Ice applied    Home Living                      Prior Function            PT Goals (current goals can now be found in the care plan section) Progress towards PT goals: Progressing toward goals    Frequency    7X/week      PT Plan Current plan remains appropriate    Co-evaluation              AM-PAC PT "6 Clicks" Mobility   Outcome Measure  Help needed turning from your back to your side while in a flat bed without using bedrails?: A Little Help needed moving  from lying on your back to sitting on the side of a flat bed without using bedrails?: A Little Help needed moving to and from a bed to a chair (including a wheelchair)?: A Little Help needed standing up from a chair using your arms (e.g., wheelchair or bedside chair)?: A Little Help needed to walk in hospital room?: A Little Help needed climbing 3-5 steps with a railing? : A Little 6 Click Score: 18    End of Session Equipment Utilized During Treatment: Gait belt Activity Tolerance: Patient tolerated treatment well Patient left: with call bell/phone within reach;in bed;with bed alarm set;with nursing/sitter in room   PT Visit Diagnosis: Other abnormalities of gait and mobility  (R26.89);Difficulty in walking, not elsewhere classified (R26.2)     Time: 6579-0383 PT Time Calculation (min) (ACUTE ONLY): 24 min  Charges:  $Gait Training: 8-22 mins $Therapeutic Exercise: 8-22 mins                     Carmelia Bake, PT, DPT Acute Rehabilitation Services Office: [phone] Pager: [phone]  [name] [name] 08/19/2018, 1:59 PM

## 2018-08-19 NOTE — Progress Notes (Signed)
     Subjective: 3 Days Post-Op Procedure(s) (LRB): TOTAL KNEE ARTHROPLASTY (Left)   Patient reports pain as mild, pain well controlled.  No events throughout the night.  Memory has cleared and now appears to be oriented x 3.  Discussed urinary retention and similar symptoms after previous surgeries as well as a history of prostate issues.  Plan is as long as he is cleared by medicine he can be discharged home today.   Objective:   VITALS:   Vitals:   08/18/18 2144 08/19/18 0536  BP: (!) 97/52 (!) 108/47  Pulse: 69 63  Resp: 18 18  Temp: 99 F (37.2 C) 99.1 F (37.3 C)  SpO2: 99% 100%    Dorsiflexion/Plantar flexion intact Incision: scant drainage No cellulitis present Compartment soft  LABS Recent Labs    08/16/18 1608 08/18/18 0858 08/19/18 0313  HGB 10.0* 8.4* 7.3*  HCT 32.2* 26.1* 23.2*  WBC 6.9 6.5 5.6  PLT 183 162 141*    Recent Labs    08/16/18 1608 08/18/18 0858  NA  --  135  K  --  4.2  BUN  --  20  CREATININE 1.02 1.12  GLUCOSE  --  144*     Assessment/Plan: 3 Days Post-Op Procedure(s) (LRB): TOTAL KNEE ARTHROPLASTY (Left) Up with therapy Discharge home with home health Follow up in 2 weeks at [facility] ([place] Follow up with Dr. Theda Sers in 2 weeks.  Contact information:  [facility] [address] [place] [phone]    Urinary retention Similar issues after previous surgeries due to istory of prostate issues Maintain cath Send home with leg bag Home on antibiotics Follow up with urology in 1 week     [place] Pugh. Christella App   PAC  08/19/2018, 7:48 AM

## 2018-08-19 NOTE — Consult Note (Signed)
Cardiology Consultation:   Patient ID: ECHO ALLSBROOK; [id number]; 1928-04-30   Admit date: 08/16/2018 Date of Consult: 08/19/2018  Primary Care Provider: Levin Erp, MD Primary Cardiologist: Loralie Champagne, MD Primary Electrophysiologist:  None    Patient Profile:   [name] [name] is a 85 y.o. male with a PMH of CAD s/p CABG 2009, MR s/p MVR, HTN, HLD, and BPH, who is being seen today for the evaluation of new onset atrial fibrillation at the request of Dr. Theda Sers.  History of Present Illness:   Mr. Poitra presented 08/12/2018 for left knee replacement. Patient tolerated the procedure well, though post-op course was complicated by AMS. Medicine was consulted for assistance and recommended limited opioids and benzos. EKG was obtained to monitor QTC given haldol use and revealed what was interpreted as atrial fibrillation with controlled ventricular rate. Patient has no history of atrial fibrillation, therefore cardiology was consulted.   Patient follows with Dr. Aundra Dubin in the advanced heart failure clinic and was last seen 02/2018 for preoperative assessment for knee replacement surgery. He was without anginal complaints and deemed an acceptable risk to undergo surgery. His echocardiogram 06/2018 revealed EF 50-55%, G2DD, inferior hypokinesis, severe biatrial enlargement, mild-moderate AI, mild AS, no change in gradients through AV, and mild-moderate MAC.   The patient denies any new symptoms such as chest discomfort, neck or arm discomfort. There has been no new shortness of breath, PND or orthopnea. There have been no reported palpitations, presyncope or syncope.    Past Medical History:  Diagnosis Date  . Acute MI ([place])   . Coronary artery disease   . Diverticulosis 06-12-2003   Colonoscopy  . History of kidney stones   . Hyperlipidemia   . Hypertension   . MVP (mitral valve prolapse)   . Undiagnosed cardiac murmurs   . Urinary retention     Past Surgical History:  Procedure  Laterality Date  . CARDIAC CATHETERIZATION    . CORONARY ANGIOPLASTY    . MITRAL VALVE REPAIR     2/09  . TOTAL KNEE ARTHROPLASTY Left 08/16/2018   Procedure: TOTAL KNEE ARTHROPLASTY;  Surgeon: Sydnee Cabal, MD;  Location: [facility]S;  Service: Orthopedics;  Laterality: Left;  with adductor canal     Home Medications:  Prior to Admission medications   Medication Sig Start Date End Date Taking? Authorizing Provider  aspirin EC 81 MG tablet Take 81 mg by mouth daily.   Yes [provider]  clopidogrel (PLAVIX) 75 MG tablet TAKE 1 TABLET ONCE DAILY. Patient taking differently: Take 75 mg by mouth daily.  04/30/18  Yes Bensimhon, Shaune Pascal, MD  furosemide (LASIX) 20 MG tablet TAKE 1 TABLET BY MOUTH DAILY. Patient taking differently: Take 20 mg by mouth daily.  05/29/18  Yes Larey Dresser, MD  lisinopril (PRINIVIL,ZESTRIL) 10 MG tablet TAKE 1 TABLET ONCE DAILY. Patient taking differently: Take 10 mg by mouth daily.  06/10/18  Yes Larey Dresser, MD  Multiple Vitamin (MULTIVITAMIN) tablet Take 1 tablet by mouth daily.     Yes [provider]  simvastatin (ZOCOR) 40 MG tablet TAKE 1 TABLET ONCE DAILY. Patient taking differently: Take 40 mg by mouth daily.  06/17/18  Yes Larey Dresser, MD  Tamsulosin HCl (FLOMAX) 0.4 MG CAPS Take 0.4 mg by mouth daily.    Yes [provider]  aspirin EC 325 MG tablet Take 1 tablet (325 mg total) by mouth daily for 30 days. 08/16/18 09/15/18  Brynda Peon, PA  methocarbamol (  ROBAXIN) 500 MG tablet Take 1 tablet (500 mg total) by mouth every 8 (eight) hours as needed for up to 15 days for muscle spasms. 08/16/18 08/31/18  Brynda Peon, PA    Inpatient Medications: Scheduled Meds: . clopidogrel  75 mg Oral Daily  . docusate sodium  100 mg Oral BID  . furosemide  20 mg Oral Daily  . lisinopril  10 mg Oral Daily  . multivitamin with minerals  1 tablet Oral Daily  . QUEtiapine  25 mg Oral QHS  . simvastatin  40 mg Oral  Daily  . tamsulosin  0.4 mg Oral Daily   Continuous Infusions: . methocarbamol (ROBAXIN) IV     PRN Meds: acetaminophen, haloperidol lactate, menthol-cetylpyridinium **OR** phenol, methocarbamol **OR** methocarbamol (ROBAXIN) IV, metoCLOPramide **OR** metoCLOPramide (REGLAN) injection, ondansetron **OR** ondansetron (ZOFRAN) IV, oxyCODONE, traMADol, zolpidem  Allergies:   No Known Allergies  Social History:   Social History   Socioeconomic History  . Marital status: Married    Spouse name: Not on file  . Number of children: 2  . Years of education: Not on file  . Highest education level: Not on file  Occupational History  . Occupation: Engineer, water  . Financial resource strain: Not on file  . Food insecurity    Worry: Not on file    Inability: Not on file  . Transportation needs    Medical: Not on file    Non-medical: Not on file  Tobacco Use  . Smoking status: Never Smoker  . Smokeless tobacco: Never Used  Substance and Sexual Activity  . Alcohol use: Yes    Comment: rare  . Drug use: No  . Sexual activity: Not on file  Lifestyle  . Physical activity    Days per week: Not on file    Minutes per session: Not on file  . Stress: Not on file  Relationships  . Social Herbalist on phone: Not on file    Gets together: Not on file    Attends religious service: Not on file    Active member of club or organization: Not on file    Attends meetings of clubs or organizations: Not on file    Relationship status: Not on file  . Intimate partner violence    Fear of current or ex partner: Not on file    Emotionally abused: Not on file    Physically abused: Not on file    Forced sexual activity: Not on file  Other Topics Concern  . Not on file  Social History Narrative   One caffeine drink daily     Family History:    Family History  Problem Relation Age of Onset  . Hypertension Other   . Colon cancer Neg Hx      ROS:  Please see the  history of present illness.   All other ROS reviewed and negative.     Physical Exam/Data:   Vitals:   08/18/18 1035 08/18/18 1437 08/18/18 2144 08/19/18 0536  BP: 138/73 (!) 114/52 (!) 97/52 (!) 108/47  Pulse: 68 61 69 63  Resp: _0 Temp: 98.1 F (36.7 C) 98.9 F (37.2 C) 99 F (37.2 C) 99.1 F (37.3 C)  TempSrc:  Oral Oral   SpO2: 100% 91% 99% 100%  Weight:      Height:        Intake/Output Summary (Last 24 hours) at 08/19/2018 0919 Last data filed at 08/19/2018 0900 Gross  per 24 hour  Intake 360 ml  Output 1400 ml  Net -1040 ml   Filed Weights   08/16/18 0832 08/16/18 1525  Weight: 60.1 kg 60.1 kg   Body mass index is 20.13 kg/m.   Physical exam per MD  EKG:  The EKG was personally reviewed and demonstrates:  NSR with frequent atrial ectopy, with rate 83, no STE/D, non-specific ST-T wave abnormalities; QTC 455   Relevant CV Studies: Echocardiogram 07/17/2018: IMPRESSIONS    1. The left ventricle has low normal systolic function, with an ejection fraction of 50-55%. The cavity size was normal. Left ventricular diastolic Doppler parameters are consistent with pseudonormalization.  2. LVEF is approximately 50 to 55% with inferior hypokinesis.  3. Left atrial size was severely dilated.  4. Right atrial size was severely dilated.  5. Moderate thickening of the mitral valve leaflet. There is mild to moderate mitral annular calcification present.  6. The aortic valve is tricuspid. Moderate calcification of the aortic valve. Aortic valve regurgitation is mild to moderate by color flow Doppler. Mild stenosis of the aortic valve.  7. Compared to echo report from 2018, no significant change in gradients through AV>.  8. A valve is present in the mitral position.  9. The interatrial septum was not assessed.   Laboratory Data:  Chemistry Recent Labs  Lab 08/13/18 1220 08/16/18 1608 08/18/18 0858  NA 139  --  135  K 4.8  --  4.2  CL 105  --  104  CO2  24  --  19*  GLUCOSE 108*  --  144*  BUN 27*  --  20  CREATININE 1.14 1.02 1.12  CALCIUM 9.4  --  8.7*  GFRNONAA 58* >60 59*  GFRAA >60 >60 >60  ANIONGAP 10  --  12    No results for input(s): PROT, ALBUMIN, AST, ALT, ALKPHOS, BILITOT in the last 168 hours. Hematology Recent Labs  Lab 08/16/18 1608 08/18/18 0858 08/19/18 0313  WBC 6.9 6.5 5.6  RBC 3.20* 2.65* 2.34*  HGB 10.0* 8.4* 7.3*  HCT 32.2* 26.1* 23.2*  MCV 100.6* 98.5 99.1  MCH 31.3 31.7 31.2  MCHC 31.1 32.2 31.5  RDW 13.9 14.0 14.1  PLT 183 162 141*   Cardiac EnzymesNo results for input(s): TROPONINI in the last 168 hours. No results for input(s): TROPIPOC in the last 168 hours.  BNPNo results for input(s): BNP, PROBNP in the last 168 hours.  DDimer No results for input(s): DDIMER in the last 168 hours.  Radiology/Studies:  Ct Head Wo Contrast  Result Date: 08/18/2018 CLINICAL DATA:  Encephalopathy. EXAM: CT HEAD WITHOUT CONTRAST TECHNIQUE: Contiguous axial images were obtained from the base of the skull through the vertex without intravenous contrast. COMPARISON:  None. FINDINGS: Brain: Cerebral volume and ventricle size normal for age. Small benign cyst left basal ganglia. Mild white matter changes appear chronic. No acute cortical infarct, hemorrhage, or mass. Vascular: Atherosclerotic calcification. Negative for hyperdense vessel Skull: Negative Sinuses/Orbits: Negative Other: None IMPRESSION: No acute abnormality. Mild chronic appearing white matter changes, typical for age. Electronically Signed   By: Franchot Gallo M.D.   On: 08/18/2018 09:45    Assessment and Plan:   1. Arrhythmia:  See below  2. CAD s/p CABG: no anginal complaints. Plavix held 5 days prior to surgery and resumed shortly after - Continue plavix and statin  3. Anemia: Hgb continues to trend down 11>10>8.4>7.3. Likely blood loss from surgery, though continues to downtrend.  - Will defer anemia work-up  to medicine team - Would transfuse to  maintain Hgb >8 given heart disease history  4. HTN: BP on the soft side - Continue lisinopril   5. HLD: LDL 66 02/2018 - Continue statin  6. S/p left knee replacement: patient underwent surgery 08/13/2018 with post-op course complicated by AMS (medicine assisting) - Continue management per primary team.    For questions or updates, please contact [facility] Please consult [email or web address] for contact info under Cardiology/STEMI.   Signed, Abigail Butts, PA-C  08/19/2018 9:19 AM [phone]   History and all data above reviewed.  Patient examined.  I agree with the findings as above.  The patient has a history of MV repair and has LAE on echo.  He has never been noted to have atrial fib.  I reviewed the EKG from Jan and this was NSR.  The patient denies any new symptoms such as chest discomfort, neck or arm discomfort. There has been no new shortness of breath, PND or orthopnea. There have been no reported palpitations, presyncope or syncope.  He had an EKG this admission and it was interpreted as atrial fib.  However, the EKG yesterday had baseline artifact.  There are clearly P waves however, with sinus beats and MAT.  Today he has NSR The patient exam reveals COR:RRR  ,  Lungs: Clear  ,  Abd: Positive bowel sounds, no rebound no guarding, Ext No edema  .  All available labs, radiology testing, previous records reviewed. Agree with documented assessment and plan.   Arrhythmia:  He is clearly at risk for atrial fib but the few beats we see on the EKG yesterday I do not think justifies this diagnosis as it is likely also to be MAT.  I would suggest discharge and we will arrange a Zio patch to see if we can clearly discern atrial fib.  If so, by that time, we will know whether his Hgb is trending up and can then switch to  [place].  For now continue current meds.  We will arrange clinic follow up.    Jeneen Rinks Deslyn Cavenaugh  12:54 PM  08/19/2018

## 2018-08-19 NOTE — Consult Note (Signed)
Consult NOTE    BUBBA VANBENSCHOTEN  PIR:[id number] DOB: [birth year] DOA: 08/16/2018 PCP: Levin Erp, MD   Brief Narrative: RENALD HAITHCOCK is an 85 y.o. male with past medical history of osteoarthritis of the left knee, hypertension,Coronary disease, hyperlipidemia who was admitted here for left total knee arthroplasty.  Postop day 2. Medicine consulted today for the evaluation of agitation, confusion, altered mental status.  It was reported that he was walking on the hallway, hitting, fighting with the nurses. Patient seen and examined the bedside today.  Currently is hemodynamically stable.  He is alert, oriented to person and time but not place.  RN reported me that he was walking on the hallway last night and sitter had to be placed. Further information was also gathered from her daughter, Langley Gauss.  She says she is very active physically and mentally.  He has his own business.  He was never confused in the past.  He does not have any history of dementia.  He has history of hypertension, hyperlipidemia, coronary artery disease, mitral valve repair. Patient denied any fever, chills, shortness of breath, chest pain, palpitation, abdomen pain, nausea, vomiting or diarrhea.  Assessment & Plan:   Principal Problem:   S/P TKR (total knee replacement) using cement, left Active Problems:   HYPERCHOLESTEROLEMIA  IIA   Osteoarthritis of left knee   S/P knee replacement   AMS (altered mental status)   New onset Roland Prine-fib [facility])  Patient appears improved from AMS standpoint.   He had new onset atrial fibrillation that was found yesterday on EKG and cardiology evaluation is pending.  [place] with discharge pending final cardiology recommendations for anticoagulation.  He'll need outpatient cardiology follow up.  Pt had urinary retention and will need urology follow up for urinary retention and hematuria (suspect related to foley?). Recommend iron and outpatient follow up of his anemia.  Altered mental status: Most  likely this is secondary to delirium.  This is now improved.  He appears to be close to his baseline per discussion with orthopedics.  . Continue to minimized narcotics, benzo's, antipsychotics. Delirium precautions  Head CT without acute abnormality Labs notable for stable anemia.  UA with hematuria, possibly related to foley? Small LE.  No bacteria.      Normocytic anemia: Hemoglobin this morning 7.5, stable.  Hemoglobin was 11 on 6/16.  Most likely this is from acute blood loss anemia secondary to surgery.  No need of transfusion unless hemoglobin less than 7. Recommend starting iron and follow outpatient.  Also mild thrombocytopenia, follow outpatient.  History of coronary artery disease: Currently stable.  Status post coronary artery bypass graft in 2009.[place] Kitchen On Plavix, statin.  History of acute MI in 2006 with V. fib arrest.  History of mitral valve regurgitation: Status post mitral valve repair in 2009.  Follows with cardiology.  Last echocardiogram done in 5/20 showed ejection fraction of 50 to 55%.  Hyperlipidemia: Continue statin  Hypertension: Currently blood pressure stable.  Continue current medications  BPH: Continue Flomax  Total left knee replacement: Management as per orthopedics.  Pain looks well controlled at present.  DVT prophylaxis: SCD Code Status: full  Family Communication: none at bedside Disposition Plan: likely d/c today   Primary team is ortho:  Cardiology consulted   Procedures:   L total knee replacement  Antimicrobials:  Anti-infectives (From admission, onward)   Start     Dose/Rate Route Frequency Ordered Stop   08/19/18 0000  sulfamethoxazole-trimethoprim (BACTRIM DS) 800-160 MG tablet  1 tablet Oral 2 times daily 08/19/18 1021     08/16/18 0815  ceFAZolin (ANCEF) IVPB 2g/100 mL premix     2 g 200 mL/hr over 30 Minutes Intravenous On call to O.R. 08/16/18 0800 08/16/18 1114     Subjective: Klani Caridi&Ox2-3 (can't name specifically [facility], but  knows he's in hospital)  Objective: Vitals:   08/18/18 1437 08/18/18 2144 08/19/18 0536 08/19/18 1101  BP: (!) 114/52 (!) 97/52 (!) 108/47 111/65  Pulse: 61 69 63 77  Resp: 16 18 18 16   Temp: 98.9 F (37.2 C) 99 F (37.2 C) 99.1 F (37.3 C)   TempSrc: Oral Oral    SpO2: 91% 99% 100% 99%  Weight:      Height:        Intake/Output Summary (Last 24 hours) at 08/19/2018 1122 Last data filed at 08/19/2018 0900 Gross per 24 hour  Intake 360 ml  Output 1200 ml  Net -840 ml   Filed Weights   08/16/18 0832 08/16/18 1525  Weight: 60.1 kg 60.1 kg    Examination:  General: No acute distress. Cardiovascular: Heart sounds show Nuel Dejaynes regular rate, and rhythm Lungs: Clear to auscultation bilaterally Abdomen: Soft, nontender, nondistended  Neurological: Alert and oriented 3. Moves all extremities 4. Cranial nerves II through XII grossly intact. Skin: Warm and dry. No rashes or lesions. Extremities: L knee with intact dressing Psychiatric: Mood and affect are normal. Insight and judgment are appropriate.    Data Reviewed: I have personally reviewed following labs and imaging studies  CBC: Recent Labs  Lab 08/13/18 1220 08/16/18 1608 08/18/18 0858 08/19/18 0313 08/19/18 0844  WBC 3.6* 6.9 6.5 5.6  --   NEUTROABS  --   --  5.2 3.9  --   HGB 11.0* 10.0* 8.4* 7.3* 7.5*  HCT 34.9* 32.2* 26.1* 23.2* 24.0*  MCV 99.1 100.6* 98.5 99.1  --   PLT 214 183 162 141*  --    Basic Metabolic Panel: Recent Labs  Lab 08/13/18 1220 08/16/18 1608 08/18/18 0858 08/19/18 0844  NA 139  --  135 135  K 4.8  --  4.2 3.6  CL 105  --  104 104  CO2 24  --  19* 22  GLUCOSE 108*  --  144* 178*  BUN 27*  --  20 21  CREATININE 1.14 1.02 1.12 1.21  CALCIUM 9.4  --  8.7* 8.3*   GFR: Estimated Creatinine Clearance: 36.6 mL/min (by C-G formula based on SCr of 1.21 mg/dL). Liver Function Tests: No results for input(s): AST, ALT, ALKPHOS, BILITOT, PROT, ALBUMIN in the last 168 hours. No results for  input(s): LIPASE, AMYLASE in the last 168 hours. No results for input(s): AMMONIA in the last 168 hours. Coagulation Profile: No results for input(s): INR, PROTIME in the last 168 hours. Cardiac Enzymes: No results for input(s): CKTOTAL, CKMB, CKMBINDEX, TROPONINI in the last 168 hours. BNP (last 3 results) No results for input(s): PROBNP in the last 8760 hours. HbA1C: No results for input(s): HGBA1C in the last 72 hours. CBG: No results for input(s): GLUCAP in the last 168 hours. Lipid Profile: No results for input(s): CHOL, HDL, LDLCALC, TRIG, CHOLHDL, LDLDIRECT in the last 72 hours. Thyroid Function Tests: No results for input(s): TSH, T4TOTAL, FREET4, T3FREE, THYROIDAB in the last 72 hours. Anemia Panel: No results for input(s): VITAMINB12, FOLATE, FERRITIN, TIBC, IRON, RETICCTPCT in the last 72 hours. Sepsis Labs: No results for input(s): PROCALCITON, LATICACIDVEN in the last 168 hours.  Recent Results (from  the past 240 hour(s))  Surgical pcr screen     Status: None   Collection Time: 08/13/18 12:20 PM   Specimen: Nasal Mucosa; Nasal Swab  Result Value Ref Range Status   MRSA, PCR NEGATIVE NEGATIVE Final   Staphylococcus aureus NEGATIVE NEGATIVE Final    Comment: (NOTE) The Xpert SA Assay (FDA approved for NASAL specimens in patients 83 years of age and older), is one component of Kimberl Vig comprehensive surveillance program. It is not intended to diagnose infection nor to guide or monitor treatment. Performed at [facility], [address] [place], [place] [address]   Novel Coronavirus, NAA (hospital order; send-out to ref lab)     Status: None   Collection Time: 08/13/18 12:50 PM   Specimen: Nasopharyngeal Swab; Respiratory  Result Value Ref Range Status   SARS-CoV-2, NAA NOT DETECTED NOT DETECTED Final    Comment: (NOTE) This test was developed and its performance characteristics determined by [facility]. This test has not been FDA cleared  or approved. This test has been authorized by FDA under an Emergency Use Authorization (EUA). This test is only authorized for the duration of time the declaration that circumstances exist justifying the authorization of the emergency use of in vitro diagnostic tests for detection of SARS-CoV-2 virus and/or diagnosis of COVID-19 infection under section 564(b)(1) of the Act, 21 U.S.C. 604VWU-9(W)(1360bbb-3(b)(1), unless the authorization is terminated or revoked sooner. When diagnostic testing is negative, the possibility of Cabrini Ruggieri false negative result should be considered in the context of Mckenzee Beem patient's recent exposures and the presence of clinical signs and symptoms consistent with COVID-19. An individual without symptoms of COVID-19 and who is not shedding SARS-CoV-2 virus would expect to have Faiz Weber negative (not detected) result in this assay. Performed  At: [facility] [address] [place], [place] [id number] Jolene SchimkeNagendra Sanjai MD AO:[id number]:[phone]    Coronavirus Source NASOPHARYNGEAL  Final    Comment: Performed at [facility], [address] [place], [place] [address]         Radiology Studies: Ct Head Wo Contrast  Result Date: 08/18/2018 CLINICAL DATA:  Encephalopathy. EXAM: CT HEAD WITHOUT CONTRAST TECHNIQUE: Contiguous axial images were obtained from the base of the skull through the vertex without intravenous contrast. COMPARISON:  None. FINDINGS: Brain: Cerebral volume and ventricle size normal for age. Small benign cyst left basal ganglia. Mild white matter changes appear chronic. No acute cortical infarct, hemorrhage, or mass. Vascular: Atherosclerotic calcification. Negative for hyperdense vessel Skull: Negative Sinuses/Orbits: Negative Other: None IMPRESSION: No acute abnormality. Mild chronic appearing white matter changes, typical for age. Electronically Signed   By: Marlan Palauharles  Harting M.D.   On: 08/18/2018 09:45        Scheduled Meds: . clopidogrel  75 mg Oral Daily  .  docusate sodium  100 mg Oral BID  . furosemide  20 mg Oral Daily  . lisinopril  10 mg Oral Daily  . multivitamin with minerals  1 tablet Oral Daily  . QUEtiapine  25 mg Oral QHS  . simvastatin  40 mg Oral Daily  . tamsulosin  0.4 mg Oral Daily   Continuous Infusions: . methocarbamol (ROBAXIN) IV       LOS: 3 days    Time spent: over 30 min    Lacretia Nicksaldwell Powell, MD [facility] Pager AMION  If 7PM-7AM, please contact night-coverage [email or web address] Password [email or web address] 08/19/2018, 11:22 AM

## 2018-08-19 NOTE — Progress Notes (Signed)
Physical Therapy Treatment Patient Details Name: [name] [name] MRN: [id number] DOB: Nov 09, 1928 Today's Date: 08/19/2018    History of Present Illness 85 yo male s/p L TKR on 08/16/18. PMH includes MVR, HLD, CAD s/p CABG, HTN.    PT Comments    Pt requested therapy check back in afternoon and awaiting his ride home.  Pt agreeable to mobilize again.  Pt ambulated in hallway and performed stairs again as pt requiring cues mostly for sequence.  Pt also verbally reviewed HEP handout and emphasized stair sequence and no pillows under knee (also written in the end of handout).  Answered pt's questions within scope of practice and pt feels ready for d/c home today.   Follow Up Recommendations  Follow surgeon's recommendation for DC plan and follow-up therapies;Supervision for mobility/OOB     Equipment Recommendations  None recommended by PT    Recommendations for Other Services       Precautions / Restrictions Precautions Precautions: Fall;Knee Restrictions Other Position/Activity Restrictions: WBAT    Mobility  Bed Mobility Overal bed mobility: Needs Assistance Bed Mobility: Supine to Sit;Sit to Supine     Supine to sit: Min guard Sit to supine: Min guard   General bed mobility comments: min/guard for safety, left pt EOB with nursing (changing to leg bag)  Transfers Overall transfer level: Needs assistance Equipment used: Rolling walker (2 wheeled) Transfers: Sit to/from Stand Sit to Stand: Min guard         General transfer comment: min/guard for safety  Ambulation/Gait Ambulation/Gait assistance: Min guard Gait Distance (Feet): 250 Feet Assistive device: Rolling walker (2 wheeled) Gait Pattern/deviations: Step-to pattern;Decreased stance time - left;Antalgic     General Gait Details: cues for sequencing and RW safety    Stairs Stairs: Yes Stairs assistance: Min guard Stair Management: Step to pattern;Forwards;Two rails Number of Stairs: 2 General stair  comments: verbal cues for sequence and safety, performed twice, pt reports understanding   Wheelchair Mobility    Modified Rankin (Stroke Patients Only)       Balance                                            Cognition Arousal/Alertness: Awake/alert Behavior During Therapy: WFL for tasks assessed/performed Overall Cognitive Status: Within Functional Limits for tasks assessed                                 General Comments: pleasant, orientated and cooperative today      Exercises     General Comments        Pertinent Vitals/Pain Pain Assessment: 0-10 Pain Score: 3  Pain Location: L knee Pain Descriptors / Indicators: Sore;Aching;Tightness Pain Intervention(s): Monitored during session    Home Living                      Prior Function            PT Goals (current goals can now be found in the care plan section) Progress towards PT goals: Progressing toward goals    Frequency    7X/week      PT Plan Current plan remains appropriate    Co-evaluation              AM-PAC PT "6 Clicks" Mobility   Outcome Measure  Help  needed turning from your back to your side while in a flat bed without using bedrails?: A Little Help needed moving from lying on your back to sitting on the side of a flat bed without using bedrails?: A Little Help needed moving to and from a bed to a chair (including a wheelchair)?: A Little Help needed standing up from a chair using your arms (e.g., wheelchair or bedside chair)?: A Little Help needed to walk in hospital room?: A Little Help needed climbing 3-5 steps with a railing? : A Little 6 Click Score: 18    End of Session Equipment Utilized During Treatment: Gait belt Activity Tolerance: Patient tolerated treatment well Patient left: with call bell/phone within reach;in bed;with nursing/sitter in room   PT Visit Diagnosis: Other abnormalities of gait and mobility  (R26.89);Difficulty in walking, not elsewhere classified (R26.2)     Time: 9794-8016 PT Time Calculation (min) (ACUTE ONLY): 13 min  Charges:  $Gait Training: 8-22 mins                      Zenovia Jarred, PT, DPT Acute Rehabilitation Services Office: [phone] Pager: [phone]  Sarajane Jews 08/19/2018, 4:20 PM

## 2018-08-20 DIAGNOSIS — I2510 Atherosclerotic heart disease of native coronary artery without angina pectoris: Secondary | ICD-10-CM | POA: Diagnosis not present

## 2018-08-20 DIAGNOSIS — Z7982 Long term (current) use of aspirin: Secondary | ICD-10-CM | POA: Diagnosis not present

## 2018-08-20 DIAGNOSIS — R339 Retention of urine, unspecified: Secondary | ICD-10-CM | POA: Diagnosis not present

## 2018-08-20 DIAGNOSIS — I10 Essential (primary) hypertension: Secondary | ICD-10-CM | POA: Diagnosis not present

## 2018-08-20 DIAGNOSIS — Z471 Aftercare following joint replacement surgery: Secondary | ICD-10-CM | POA: Diagnosis not present

## 2018-08-20 DIAGNOSIS — Z7902 Long term (current) use of antithrombotics/antiplatelets: Secondary | ICD-10-CM | POA: Diagnosis not present

## 2018-08-20 DIAGNOSIS — Z96652 Presence of left artificial knee joint: Secondary | ICD-10-CM | POA: Diagnosis not present

## 2018-08-22 ENCOUNTER — Telehealth (HOSPITAL_COMMUNITY): Payer: Self-pay | Admitting: Cardiology

## 2018-08-22 DIAGNOSIS — Z96652 Presence of left artificial knee joint: Secondary | ICD-10-CM | POA: Diagnosis not present

## 2018-08-22 NOTE — Telephone Encounter (Signed)
Pt daughter called the [facility] wanting to know how long it going to take to get the [employer] Monitor mailed to her. Please give the daughter a call back asap. The phone number for the pt is [phone].

## 2018-08-22 NOTE — Telephone Encounter (Signed)
Returned patients daughters call regarding Cardiac event monitor which was ordered at discharge on 08/19/2018.  She was very upset that they had not been contacted to have this applied and felt this should have been applied before the patient ever left the hospital.  Explained to daughter, the order was placed on 08/19/18 but as of 08/22/18 the monitor department had not been notified this was requested.  Explained, that since [place] 19 precautions the monitor department is working remotely from our homes and that we are enrolling patients with an outside company, ([employer]), to ship the monitor to the patients home.  Asked if she would like me to proceed with the enrollment, which, she stated yes. Patient will be enrolled for [employer] to ship a 30 day cardiac event monitor to his home.  [employer] telephone number is [phone].  They can be contacted for UPS tracking of package.  Once received, please review instructions, apply monitor, and call [employer] at the above number to send baseline recording and confirm monitor transmitting.  They will be able to answer any questions regarding the monitor instructions.

## 2018-08-22 NOTE — Telephone Encounter (Signed)
[name] [name] from [employer] called and asked to expedite shipment of monitor.

## 2018-08-22 NOTE — Telephone Encounter (Signed)
Daughter called and wanted to check on the status of the patient's event monitor. Patient was discharged from the hospital on Monday, and the daughter is concerned as to why it is taking so long

## 2018-08-23 DIAGNOSIS — D649 Anemia, unspecified: Secondary | ICD-10-CM | POA: Diagnosis not present

## 2018-08-23 DIAGNOSIS — G4700 Insomnia, unspecified: Secondary | ICD-10-CM | POA: Diagnosis not present

## 2018-08-25 ENCOUNTER — Other Ambulatory Visit: Payer: Self-pay

## 2018-08-25 ENCOUNTER — Encounter (HOSPITAL_COMMUNITY): Payer: Self-pay | Admitting: Emergency Medicine

## 2018-08-25 ENCOUNTER — Emergency Department (HOSPITAL_COMMUNITY)
Admission: EM | Admit: 2018-08-25 | Discharge: 2018-08-25 | Disposition: A | Discharge status: Home / Self Care | Payer: Medicare Other | Attending: Emergency Medicine | Admitting: Emergency Medicine

## 2018-08-25 DIAGNOSIS — Z79899 Other long term (current) drug therapy: Secondary | ICD-10-CM | POA: Diagnosis not present

## 2018-08-25 DIAGNOSIS — Y732 Prosthetic and other implants, materials and accessory gastroenterology and urology devices associated with adverse incidents: Secondary | ICD-10-CM | POA: Diagnosis not present

## 2018-08-25 DIAGNOSIS — Z951 Presence of aortocoronary bypass graft: Secondary | ICD-10-CM | POA: Insufficient documentation

## 2018-08-25 DIAGNOSIS — I2510 Atherosclerotic heart disease of native coronary artery without angina pectoris: Secondary | ICD-10-CM | POA: Diagnosis not present

## 2018-08-25 DIAGNOSIS — Z96652 Presence of left artificial knee joint: Secondary | ICD-10-CM | POA: Diagnosis not present

## 2018-08-25 DIAGNOSIS — T839XXA Unspecified complication of genitourinary prosthetic device, implant and graft, initial encounter: Secondary | ICD-10-CM | POA: Diagnosis not present

## 2018-08-25 DIAGNOSIS — T83098A Other mechanical complication of other indwelling urethral catheter, initial encounter: Secondary | ICD-10-CM | POA: Diagnosis not present

## 2018-08-25 DIAGNOSIS — Z7982 Long term (current) use of aspirin: Secondary | ICD-10-CM | POA: Diagnosis not present

## 2018-08-25 DIAGNOSIS — I10 Essential (primary) hypertension: Secondary | ICD-10-CM | POA: Insufficient documentation

## 2018-08-25 DIAGNOSIS — Z7902 Long term (current) use of antithrombotics/antiplatelets: Secondary | ICD-10-CM | POA: Insufficient documentation

## 2018-08-25 DIAGNOSIS — Z466 Encounter for fitting and adjustment of urinary device: Secondary | ICD-10-CM | POA: Diagnosis present

## 2018-08-25 LAB — URINALYSIS, ROUTINE W REFLEX MICROSCOPIC
Bacteria, UA: NONE SEEN
Bilirubin Urine: NEGATIVE
Glucose, UA: NEGATIVE mg/dL
Ketones, ur: NEGATIVE mg/dL
Nitrite: NEGATIVE
Protein, ur: 100 mg/dL — AB
RBC / HPF: >50 RBC/hpf — ABNORMAL HIGH (ref 0–5)
Specific Gravity, Urine: 1.02 (ref 1.005–1.030)
WBC, UA: >50 WBC/hpf — ABNORMAL HIGH (ref 0–5)
pH: 6 (ref 5.0–8.0)

## 2018-08-25 NOTE — ED Triage Notes (Signed)
Patient is complaining the urinary catheter is leaking around his penis. Patient thinks the catheter is stopped up.

## 2018-08-25 NOTE — ED Provider Notes (Signed)
[facility] DEPT Provider Note   CSN: [id number] Arrival date & time: 08/25/18  0335     History   Chief Complaint Chief Complaint  Patient presents with  . Clogged Urinary Catheter    HPI [name] [name] is a 85 y.o. male.     The history is provided by the patient.  Patient presents with Foley catheter complication. Patient recently underwent left total knee arthroplasty.  He was discharged home with a Foley catheter.  He reports tonight that he thinks his Foley catheter is clogged.  He reports that he is passing urine around the catheter outside of his penis. He has no other complaints-no fevers or vomiting.  No significant abdominal or back pain.  He reports his knee is recovering well from the surgery Course is worsening Nothing improves his symptoms  Past Medical History:  Diagnosis Date  . Acute MI ([place])   . Coronary artery disease   . Diverticulosis 06-12-2003   Colonoscopy  . History of kidney stones   . Hyperlipidemia   . Hypertension   . MVP (mitral valve prolapse)   . Undiagnosed cardiac murmurs   . Urinary retention     Patient Active Problem List   Diagnosis Date Noted  . AMS (altered mental status) 08/18/2018  . New onset a-fib ([place]) 08/18/2018  . Osteoarthritis of left knee 08/16/2018  . S/P knee replacement 08/16/2018  . S/P TKR (total knee replacement) using cement, left 08/16/2018  . Carotid stenosis 07/28/2015  . Aortic stenosis, mild 06/05/2011  . Special screening for malignant neoplasms, colon 09/21/2010  . Irregular heart rate 09/21/2010  . S/P mitral valve repair 07/07/2010  . Coronary artery disease 07/07/2010  . DYSPNEA 01/13/2009  . MITRAL VALVE PROLAPSE, NON-RHEUMATIC 07/16/2008  . HYPERCHOLESTEROLEMIA  IIA 03/11/2008  . HYPERLIPIDEMIA-MIXED 03/11/2008  . CAD, ARTERY BYPASS GRAFT 03/11/2008    Past Surgical History:  Procedure Laterality Date  . CARDIAC CATHETERIZATION    . CORONARY ANGIOPLASTY     . MITRAL VALVE REPAIR     2/09  . TOTAL KNEE ARTHROPLASTY Left 08/16/2018   Procedure: TOTAL KNEE ARTHROPLASTY;  Surgeon: Sydnee Cabal, MD;  Location: [facility] ORS;  Service: Orthopedics;  Laterality: Left;  with adductor canal        Home Medications    Prior to Admission medications   Medication Sig Start Date End Date Taking? Authorizing Provider  aspirin EC 325 MG tablet Take 1 tablet (325 mg total) by mouth daily for 30 days. 08/16/18 09/15/18  Brynda Peon, PA  clopidogrel (PLAVIX) 75 MG tablet TAKE 1 TABLET ONCE DAILY. Patient taking differently: Take 75 mg by mouth daily.  04/30/18   Bensimhon, Shaune Pascal, MD  docusate sodium (COLACE) 100 MG capsule Take 1 capsule (100 mg total) by mouth 2 (two) times daily. 08/19/18   Danae Orleans, PA-C  ferrous sulfate (FERROUSUL) 325 (65 FE) MG tablet Take 1 tablet (325 mg total) by mouth 3 (three) times daily with meals. 08/19/18   Danae Orleans, PA-C  furosemide (LASIX) 20 MG tablet TAKE 1 TABLET BY MOUTH DAILY. Patient taking differently: Take 20 mg by mouth daily.  05/29/18   Larey Dresser, MD  lisinopril (PRINIVIL,ZESTRIL) 10 MG tablet TAKE 1 TABLET ONCE DAILY. Patient taking differently: Take 10 mg by mouth daily.  06/10/18   Larey Dresser, MD  methocarbamol (ROBAXIN) 500 MG tablet Take 1 tablet (500 mg total) by mouth every 8 (eight) hours as needed for up to 15  days for muscle spasms. 08/16/18 08/31/18  Karma GreaserBarton, Samantha Bonham, PA  Multiple Vitamin (MULTIVITAMIN) tablet Take 1 tablet by mouth daily.      [provider]  polyethylene glycol (MIRALAX / GLYCOLAX) 17 g packet Take 17 g by mouth 2 (two) times daily. 08/19/18   Lanney GinsBabish, Matthew, PA-C  simvastatin (ZOCOR) 40 MG tablet TAKE 1 TABLET ONCE DAILY. Patient taking differently: Take 40 mg by mouth daily.  06/17/18   Laurey MoraleMcLean, Dalton S, MD  sulfamethoxazole-trimethoprim (BACTRIM DS) 800-160 MG tablet Take 1 tablet by mouth 2 (two) times daily. 08/19/18   Lanney GinsBabish, Matthew, PA-C   Tamsulosin HCl (FLOMAX) 0.4 MG CAPS Take 0.4 mg by mouth daily.     [provider]    Family History Family History  Problem Relation Age of Onset  . Hypertension Other   . Colon cancer Neg Hx     Social History Social History   Tobacco Use  . Smoking status: Never Smoker  . Smokeless tobacco: Never Used  Substance Use Topics  . Alcohol use: Yes    Comment: rare  . Drug use: No     Allergies   Patient has no known allergies.   Review of Systems Review of Systems  Constitutional: Negative for fever.  Respiratory: Negative for cough.   Gastrointestinal: Negative for vomiting.  Genitourinary: Positive for difficulty urinating.  All other systems reviewed and are negative.    Physical Exam Updated Vital Signs BP (!) 151/70 (BP Location: Left Arm)   Pulse (!) 52   Temp 97.9 F (36.6 C) (Oral)   Resp 15   Ht 1.727 m (5\' 8" )   Wt 63.5 kg   SpO2 100%   BMI 21.29 kg/m   Physical Exam  CONSTITUTIONAL: Elderly, no acute distress HEAD: Normocephalic/atraumatic EYES: EOMI ENMT: Mucous membranes moist NECK: supple no meningeal signs CV: S1/S2 noted, no murmurs/rubs/gallops noted LUNGS: Lungs are clear to auscultation bilaterally, no apparent distress ABDOMEN: soft, nontender GU: Catheter in place, no bleeding noted around the catheter, no bruising or evidence of trauma NEURO: Pt is awake/alert/appropriate, moves all extremitiesx4.  No facial droop.   EXTREMITIES:  full ROM, bandage noted to left knee SKIN: warm, color normal PSYCH: no abnormalities of mood noted, alert and oriented to situation  ED Treatments / Results  Labs (all labs ordered are listed, but only abnormal results are displayed) Labs Reviewed  URINALYSIS, ROUTINE W REFLEX MICROSCOPIC - Abnormal; Notable for the following components:      Result Value   Color, Urine RED (*)    APPearance CLOUDY (*)    Hgb urine dipstick LARGE (*)    Protein, ur 100 (*)    Leukocytes,Ua TRACE (*)     RBC / HPF >50 (*)    WBC, UA >50 (*)    Crystals PRESENT (*)    All other components within normal limits  URINE CULTURE    EKG    Radiology No results found.  Procedures Procedures  Medications Ordered in ED Medications - No data to display   Initial Impression / Assessment and Plan / ED Course  I have reviewed the triage vital signs and the nursing notes.  Pertinent labs  results that were available during my care of the patient were reviewed by me and considered in my medical decision making (see chart for details).        Patient presented with Foley catheter complication.  He has been wearing this postop since left total knee replacement.  His catheter was changed here by nursing staff without difficulty.  He is already on oral antibiotics Patient reports he is supposed to get the Foley catheter out later this week.  He is appropriate for discharge home   Final Clinical Impressions(s) / ED Diagnoses   Final diagnoses:  Complication of Foley catheter, initial encounter [facility])    ED Discharge Orders    None       Zadie Rhine, MD 08/25/18 [phone]

## 2018-08-25 NOTE — ED Notes (Signed)
Discharge instructions reviewed. Standard drainage bag changed to a leg bag and care of leg bag reviewed with patient. Opportunity for questions and concerns allowed. Patient alert, stable, and ambulatory with walker at discharge.

## 2018-08-26 LAB — URINE CULTURE: Culture: NO GROWTH

## 2018-08-27 DIAGNOSIS — N401 Enlarged prostate with lower urinary tract symptoms: Secondary | ICD-10-CM | POA: Diagnosis not present

## 2018-08-27 DIAGNOSIS — R338 Other retention of urine: Secondary | ICD-10-CM | POA: Diagnosis not present

## 2018-08-27 DIAGNOSIS — R972 Elevated prostate specific antigen [PSA]: Secondary | ICD-10-CM | POA: Diagnosis not present

## 2018-08-28 DIAGNOSIS — Z96652 Presence of left artificial knee joint: Secondary | ICD-10-CM | POA: Diagnosis not present

## 2018-08-28 DIAGNOSIS — Z471 Aftercare following joint replacement surgery: Secondary | ICD-10-CM | POA: Diagnosis not present

## 2018-08-29 ENCOUNTER — Ambulatory Visit (INDEPENDENT_AMBULATORY_CARE_PROVIDER_SITE_OTHER): Payer: Medicare Other

## 2018-08-29 ENCOUNTER — Telehealth (HOSPITAL_COMMUNITY): Payer: Self-pay | Admitting: Cardiology

## 2018-08-29 DIAGNOSIS — I4891 Unspecified atrial fibrillation: Secondary | ICD-10-CM

## 2018-08-29 NOTE — Telephone Encounter (Signed)
COVID Screening completed. ° °-[facility] °

## 2018-09-02 ENCOUNTER — Other Ambulatory Visit: Payer: Self-pay

## 2018-09-02 ENCOUNTER — Other Ambulatory Visit: Payer: Self-pay | Admitting: Internal Medicine

## 2018-09-02 ENCOUNTER — Ambulatory Visit (HOSPITAL_COMMUNITY)
Admission: RE | Admit: 2018-09-02 | Discharge: 2018-09-02 | Disposition: A | Discharge status: Home / Self Care | Payer: Medicare Other | Source: Ambulatory Visit | Attending: Cardiology | Admitting: Cardiology

## 2018-09-02 ENCOUNTER — Encounter (HOSPITAL_COMMUNITY): Payer: Self-pay | Admitting: Cardiology

## 2018-09-02 VITALS — BP 100/38 | HR 57 | Wt 133.8 lb

## 2018-09-02 DIAGNOSIS — I499 Cardiac arrhythmia, unspecified: Secondary | ICD-10-CM | POA: Diagnosis not present

## 2018-09-02 DIAGNOSIS — Z87442 Personal history of urinary calculi: Secondary | ICD-10-CM | POA: Diagnosis not present

## 2018-09-02 DIAGNOSIS — Z7902 Long term (current) use of antithrombotics/antiplatelets: Secondary | ICD-10-CM | POA: Insufficient documentation

## 2018-09-02 DIAGNOSIS — Z79899 Other long term (current) drug therapy: Secondary | ICD-10-CM | POA: Insufficient documentation

## 2018-09-02 DIAGNOSIS — I252 Old myocardial infarction: Secondary | ICD-10-CM | POA: Insufficient documentation

## 2018-09-02 DIAGNOSIS — I2510 Atherosclerotic heart disease of native coronary artery without angina pectoris: Secondary | ICD-10-CM

## 2018-09-02 DIAGNOSIS — Z8249 Family history of ischemic heart disease and other diseases of the circulatory system: Secondary | ICD-10-CM | POA: Diagnosis not present

## 2018-09-02 DIAGNOSIS — R0602 Shortness of breath: Secondary | ICD-10-CM

## 2018-09-02 DIAGNOSIS — Z7982 Long term (current) use of aspirin: Secondary | ICD-10-CM | POA: Insufficient documentation

## 2018-09-02 DIAGNOSIS — I5032 Chronic diastolic (congestive) heart failure: Secondary | ICD-10-CM | POA: Diagnosis not present

## 2018-09-02 DIAGNOSIS — I480 Paroxysmal atrial fibrillation: Secondary | ICD-10-CM | POA: Diagnosis not present

## 2018-09-02 DIAGNOSIS — I350 Nonrheumatic aortic (valve) stenosis: Secondary | ICD-10-CM | POA: Diagnosis not present

## 2018-09-02 DIAGNOSIS — Z951 Presence of aortocoronary bypass graft: Secondary | ICD-10-CM | POA: Insufficient documentation

## 2018-09-02 DIAGNOSIS — N400 Enlarged prostate without lower urinary tract symptoms: Secondary | ICD-10-CM | POA: Diagnosis not present

## 2018-09-02 DIAGNOSIS — E785 Hyperlipidemia, unspecified: Secondary | ICD-10-CM | POA: Insufficient documentation

## 2018-09-02 DIAGNOSIS — Z96652 Presence of left artificial knee joint: Secondary | ICD-10-CM | POA: Diagnosis not present

## 2018-09-02 DIAGNOSIS — I110 Hypertensive heart disease with heart failure: Secondary | ICD-10-CM | POA: Diagnosis not present

## 2018-09-02 DIAGNOSIS — D649 Anemia, unspecified: Secondary | ICD-10-CM | POA: Diagnosis not present

## 2018-09-02 LAB — CBC
HCT: 27.1 % — ABNORMAL LOW (ref 39.0–52.0)
Hemoglobin: 8.2 g/dL — ABNORMAL LOW (ref 13.0–17.0)
MCH: 30.8 pg (ref 26.0–34.0)
MCHC: 30.3 g/dL (ref 30.0–36.0)
MCV: 101.9 fL — ABNORMAL HIGH (ref 80.0–100.0)
Platelets: 459 10*3/uL — ABNORMAL HIGH (ref 150–400)
RBC: 2.66 MIL/uL — ABNORMAL LOW (ref 4.22–5.81)
RDW: 15.7 % — ABNORMAL HIGH (ref 11.5–15.5)
WBC: 5.2 10*3/uL (ref 4.0–10.5)
nRBC: 0 % (ref 0.0–0.2)

## 2018-09-02 LAB — BASIC METABOLIC PANEL
Anion gap: 7 (ref 5–15)
BUN: 26 mg/dL — ABNORMAL HIGH (ref 8–23)
CO2: 24 mmol/L (ref 22–32)
Calcium: 8.9 mg/dL (ref 8.9–10.3)
Chloride: 109 mmol/L (ref 98–111)
Creatinine, Ser: 1.26 mg/dL — ABNORMAL HIGH (ref 0.61–1.24)
GFR calc Af Amer: 59 mL/min — ABNORMAL LOW (ref >60)
GFR calc non Af Amer: 51 mL/min — ABNORMAL LOW (ref >60)
Glucose, Bld: 106 mg/dL — ABNORMAL HIGH (ref 70–99)
Potassium: 4.5 mmol/L (ref 3.5–5.1)
Sodium: 140 mmol/L (ref 135–145)

## 2018-09-02 LAB — BRAIN NATRIURETIC PEPTIDE: B Natriuretic Peptide: 164.5 pg/mL — ABNORMAL HIGH (ref 0.0–100.0)

## 2018-09-02 MED ORDER — LISINOPRIL 5 MG PO TABS: 5.0000 mg | ORAL_TABLET | Freq: Every day | ORAL | 6 refills | Status: DC

## 2018-09-02 MED ORDER — FUROSEMIDE 40 MG PO TABS: 40.0000 mg | ORAL_TABLET | Freq: Every day | ORAL | 6 refills | Status: DC

## 2018-09-02 MED ORDER — POTASSIUM CHLORIDE CRYS ER 20 MEQ PO TBCR: 20.0000 meq | EXTENDED_RELEASE_TABLET | Freq: Every day | ORAL | 6 refills | Status: DC

## 2018-09-02 NOTE — Progress Notes (Signed)
ReDS Vest - 09/02/18 1000      ReDS Vest   Fitting Posture  Sitting    Height Marker  --   station C   Ruler Value  33    ReDS Value  29

## 2018-09-02 NOTE — Patient Instructions (Addendum)
Decrease Lisinopril to 5 mg daily  Increase Furosemide (Lasix) to 40 mg daily  Start Potassium (Klor-Con) 20 meq daily  Labs drawn today  Labs in 7-10 days  Your physician has requested that you have a lexiscan myoview. For further information please visit [email or web address]. Please follow instruction sheet, as given.  Your physician recommends that you schedule a follow-up appointment in: 1 month  How to Prepare for Your Myoview Test (stress test):  1. Please do not take these medications before your test: ALL MAYBE TAKEN 2. Your remaining medications may be taken with water. 3. Nothing to eat or drink, except water, 4 hours prior to arrival time.  NO caffeine/decaffeinated products, or chocolate 12 hours prior to arrival. 4. Ladies, please do not wear dresses.  Skirts or pants are approprate, please wear a short sleeve shirt. 5. NO perfume, cologne or lotion 6. Wear comfortable walking shoes.  NO HEELS! 7. Total time is 3 to 4 hours; you may want to bring reading material for the waiting time. 8. Please report to [facility] for your test  What to expect after you arrive:  Once you arrive and check in for your appointment an IV will be started in your arm.  Then the Technoligist will inject a small amount of radioactive tracer.  There will be a 1 hour waiting period after this injection.  A series of pictures will be taken of your heart following this waiting period.  You will be prepped for the stress portion of the test.  During the stress portion of your test you will either walk on a treadmill or receive a small, safe amount of radioactive tracer injected in your IV.  After the stress portion, there is a short rest period during which time your heart and blood pressure will be monitored.  After the short rest period the Technologist will begin your second set of pictures.  Your doctor will inform you of your test results within 7-10 business days.  In preparation for your  appointment, medication and supplies will be purchased.  Appointment availability is limited, so if you need to cancel or reschedule please call the office at [phone] 24 hours in advance to avoid a cancellation fee of $100.00  IF [place], [place] TECHNOLOGIST.

## 2018-09-02 NOTE — Progress Notes (Addendum)
Patient ID: [name] [name], male   DOB: [birth year], 85 y.o.   MRN: [id number] PCP: Dr. Chilton Si Cardiology: Dr. Shirlee Latch  85 y.o.with history of CAD s/p CABG and mitral regurgitation s/p mitral valve repair presents for cardiology followup.    Patient had most recent echo in 5/20, showing EF 50-55% with inferior hypokinesis, mild AS, mild-moderate AI, s/p MV repair with stable mitral valve.    In 6/20, he had left TKR.  Cardiology was consulted while he was in the hospital for ?atrial fibrillation, but this was thought to actually be frequent PACs versus MAT.    He is now back at home.  Still weak post-surgery.  He gets short of breath walking around the house, especially at night (not as noticeable when he is walking around during the day).  He has bendopnea. He gets short of breath if he walks fast.  No lightheadedness or falls.  He is wearing a 30 day event monitor given concern for possible atrial fibrillation.  No BRBPR/melena.  No chest pain. He has fatigue and daytime sleepiness.   REDS clip measurement: 29%  Labs (2/14): K 4, creatinine 1.1 Labs (1/15): K 4, creatinine 1.1, LDL 64, HDL 57, pro-BNP 112 Labs (1/79): K 4.1, creatinine 1.03, LDL 66, HDL 53 Labs (5/17): LDL 57, HDL 71 Labs (7/17): K 4.2, creatinine 1.12 Labs (8/18): K 4.3, creatinine 1.07, LDL 58 Labs (1/20): LDL 66 Labs (6/20): K 3.6, creatinine 1.21, hgb 7.3  ECG (personally reviewed, 6/20): NSR with possible run of MAT ECG (personally reviewed, today): NSR, PACs, iRBBB  PMH: 1. CAD: Acute MI in 2006 with ventricular fibrillation arrest.  Patient had 1st generation DES to LCx.  Patient then had CABG 2009 with LIMA-OM and SVG-PDA.  Plan has been to continue DAPT long-term because of 1st generation DES.  2. Nephrolithiasis 3. HTN 4. Hyperlipidemia 5. Diverticulosis 6. Aortic stenosis: Mild on 7/20 echo. 7. Mitral valve prolapse with severe MR: MV repair in 2009.  Echo (2/14) with EF 50-55%, inferior HK, mild AS with mean  gradient 12 mmHg, s/p MV repair with mild MR and no significant MS (mean gradient 3 mmHg), mild RV dilation, PA systolic pressure 37 mmHg.  Echo (3/16) with EF 50-55%, mild focal basal septal hypertrophy, mild aortic stenosis mean gradient 14 mmHg, s/p MV repair with mild MR and no MS, PASP 38 mmHg.  - Echo (5/20): EF 50-55% with inferior hypokinesis, mild AS, mild-moderate AI, s/p MV repair with stable mitral valve. 8. Thyroid nodule: Biopsy with benign follicular nodule in 2018. 9. 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   FH: HTN  ROS: All systems reviewed and negative except as per HPI.   Current Outpatient Medications  Medication Sig Dispense Refill  . aspirin EC 325 MG tablet Take 1 tablet (325 mg total) by mouth daily for 30 days. 100 tablet 3  . clopidogrel (PLAVIX) 75 MG tablet TAKE 1 TABLET ONCE DAILY. (Patient taking differently: Take 75 mg by mouth daily. ) 90 tablet 0  . docusate sodium (COLACE) 100 MG capsule Take 1 capsule (100 mg total) by mouth 2 (two) times daily. 10 capsule 0  . ferrous sulfate (FERROUSUL) 325 (65 FE) MG tablet Take 1 tablet (325 mg total) by mouth 3 (three) times daily with meals.  3  . furosemide (LASIX) 40 MG tablet Take 1 tablet (40 mg total) by mouth daily. 3 tablet 6  . lisinopril (ZESTRIL) 5 MG tablet Take 1  tablet (5 mg total) by mouth daily. 30 tablet 6  . Multiple Vitamin (MULTIVITAMIN) tablet Take 1 tablet by mouth daily.      . polyethylene glycol (MIRALAX / GLYCOLAX) 17 g packet Take 17 g by mouth 2 (two) times daily. 14 each 0  . simvastatin (ZOCOR) 40 MG tablet TAKE 1 TABLET ONCE DAILY. (Patient taking differently: Take 40 mg by mouth daily. ) 90 tablet 0  . Tamsulosin HCl (FLOMAX) 0.4 MG CAPS Take 0.4 mg by mouth daily.     . potassium chloride SA (K-DUR) 20 MEQ tablet Take 1 tablet (20 mEq total) by mouth daily. 30 tablet 6   No current facility-administered medications for this encounter.     BP  (!) 100/38   Pulse (!) 57   Wt 60.7 kg (133 lb 12.8 oz)   SpO2 99%   BMI 20.34 kg/m  General: NAD Neck: JVP 8 cm with HJR, no thyromegaly or thyroid nodule.  Lungs: Clear to auscultation bilaterally with normal respiratory effort. CV: Nondisplaced PMI.  Heart regular S1/S2, no S3/S4, no murmur.  1+ edema 1/2 to knees bilaterally.    Abdomen: Soft, nontender, no hepatosplenomegaly, no distention.  Skin: Intact without lesions or rashes.  Neurologic: Alert and oriented x 3.  Psych: Normal affect. Extremities: No clubbing or cyanosis.  HEENT: Normal.   Assessment/Plan: 1. CAD: S/p 1st generation DES to LCx and later CABG.  He has had no chest pain but has significant exertional dyspnea since his left TKR.  - Patient has been on ASA and Plavix long-term and plan to continue this regimen.  He will decrease ASA to 81 mg daily.  - Continue statin.  - Given significant exertional symptoms, I will arrange Lexiscan Cardiolite to assess for ischemia.  2. S/p MV repair: MV stable on 5/20 echo.   3. Aortic stenosis: Mild on 5/20 echo. 4. Hyperlipidemia: Continue statin.  Good lipids in 1/20.   5. HTN: BP now running low.  - Decrease lisinopril to 5 mg daily. 6. Chronic diastolic CHF: Echo in 3/81 with EF 50-55%.  NYHA class III symptoms and he looks mildly volume overloaded on exam though REDS vest does not suggest significant volume overload.  - With significant exertional dyspnea, I will have him increase Lasix to 40 mg daily with KCl 20 mEq daily.  BMET 10 days.  7. Anemia: No overt GI bleeding, may be blood loss anemia related to surgery. This may be contributing to his dyspnea.  - CBC today.  8. Atrial arrhythmias: In the hospital, it appears that only PACs versus MAT were seen, no definite atrial fibrillation.   - He is wearing 30 day monitor, will need anticoagulation if atrial fibrillation is seen.  9. Suspect OSA: with fatigue and daytime sleepiness, will order sleep study.   Followup  4 wks.   Loralie Champagne 09/02/2018

## 2018-09-03 ENCOUNTER — Telehealth (HOSPITAL_COMMUNITY): Payer: Self-pay | Admitting: *Deleted

## 2018-09-03 DIAGNOSIS — M25662 Stiffness of left knee, not elsewhere classified: Secondary | ICD-10-CM | POA: Diagnosis not present

## 2018-09-03 NOTE — Telephone Encounter (Signed)
Patient given detailed instructions per Myocardial Perfusion Study Information Sheet for the test on 09/06/18 at 8:00. Patient notified to arrive 15 minutes early and that it is imperative to arrive on time for appointment to keep from having the test rescheduled.  If you need to cancel or reschedule your appointment, please call the office within 24 hours of your appointment. . Patient verbalized understanding.me

## 2018-09-05 DIAGNOSIS — M25662 Stiffness of left knee, not elsewhere classified: Secondary | ICD-10-CM | POA: Diagnosis not present

## 2018-09-06 ENCOUNTER — Ambulatory Visit (HOSPITAL_COMMUNITY): Payer: Medicare Other | Attending: Cardiology

## 2018-09-06 ENCOUNTER — Other Ambulatory Visit: Payer: Self-pay

## 2018-09-06 DIAGNOSIS — R0602 Shortness of breath: Secondary | ICD-10-CM

## 2018-09-06 DIAGNOSIS — I2510 Atherosclerotic heart disease of native coronary artery without angina pectoris: Secondary | ICD-10-CM | POA: Diagnosis not present

## 2018-09-06 LAB — MYOCARDIAL PERFUSION IMAGING
LV dias vol: 128 mL (ref 62–150)
LV sys vol: 50 mL
Peak HR: 71 {beats}/min
Rest HR: 52 {beats}/min
SDS: 2
SRS: 0
SSS: 2
TID: 0.97

## 2018-09-06 MED ORDER — REGADENOSON 0.4 MG/5ML IV SOLN
0.4000 mg | Freq: Once | INTRAVENOUS | Status: AC
  Administered 2018-09-06: 0.4 mg via INTRAVENOUS

## 2018-09-06 MED ORDER — TECHNETIUM TC 99M TETROFOSMIN IV KIT
31.1000 | PACK | Freq: Once | INTRAVENOUS | Status: AC | PRN
  Administered 2018-09-06: 31.1 via INTRAVENOUS
  Filled 2018-09-06: qty 32

## 2018-09-06 MED ORDER — TECHNETIUM TC 99M TETROFOSMIN IV KIT
9.7000 | PACK | Freq: Once | INTRAVENOUS | Status: AC | PRN
  Administered 2018-09-06: 9.7 via INTRAVENOUS
  Filled 2018-09-06: qty 10

## 2018-09-09 ENCOUNTER — Telehealth (HOSPITAL_COMMUNITY): Payer: Self-pay | Admitting: Cardiology

## 2018-09-09 DIAGNOSIS — I2609 Other pulmonary embolism with acute cor pulmonale: Secondary | ICD-10-CM

## 2018-09-09 DIAGNOSIS — I2699 Other pulmonary embolism without acute cor pulmonale: Secondary | ICD-10-CM

## 2018-09-09 DIAGNOSIS — R0602 Shortness of breath: Secondary | ICD-10-CM

## 2018-09-09 NOTE — Telephone Encounter (Signed)
-  patients daughter called with concerns of increased SOB, daughter reports patient can have minimal exertion and c/o being winded  -daughter also requested stress test results Advised during lab appt 7/14, nursing staff could review in detail  Daughter is concerned that patient will be unable to return to independent living. Reports she is highly concerned to leave him alone with SOB spells.  No weight available, denies swelling  Advised there was concerns for fluid overload at last appt may need medications adjustments, would forward to provider.   Please advise

## 2018-09-09 NOTE — Telephone Encounter (Signed)
1. Is he any better after increasing Lasix?  2. Stress test was low risk, does not explain symptoms.  3. If he is not improved on increased Lasix, would consider PE with surgery in 6/20.  Would have him get a BMET on Tuesday and run stat, as long as creatinine is fairly stable, would get CTA chest to rule out PE (ideally same day).

## 2018-09-10 ENCOUNTER — Other Ambulatory Visit: Payer: Self-pay

## 2018-09-10 ENCOUNTER — Ambulatory Visit (HOSPITAL_COMMUNITY)
Admission: RE | Admit: 2018-09-10 | Discharge: 2018-09-10 | Disposition: A | Discharge status: Home / Self Care | Payer: Medicare Other | Source: Ambulatory Visit | Attending: Cardiology | Admitting: Cardiology

## 2018-09-10 DIAGNOSIS — I2699 Other pulmonary embolism without acute cor pulmonale: Secondary | ICD-10-CM | POA: Diagnosis not present

## 2018-09-10 DIAGNOSIS — I4891 Unspecified atrial fibrillation: Secondary | ICD-10-CM | POA: Insufficient documentation

## 2018-09-10 DIAGNOSIS — R0602 Shortness of breath: Secondary | ICD-10-CM | POA: Diagnosis not present

## 2018-09-10 DIAGNOSIS — I2510 Atherosclerotic heart disease of native coronary artery without angina pectoris: Secondary | ICD-10-CM | POA: Insufficient documentation

## 2018-09-10 LAB — BASIC METABOLIC PANEL
Anion gap: 7 (ref 5–15)
BUN: 23 mg/dL (ref 8–23)
CO2: 24 mmol/L (ref 22–32)
Calcium: 9.3 mg/dL (ref 8.9–10.3)
Chloride: 108 mmol/L (ref 98–111)
Creatinine, Ser: 1.3 mg/dL — ABNORMAL HIGH (ref 0.61–1.24)
GFR calc Af Amer: 57 mL/min — ABNORMAL LOW (ref >60)
GFR calc non Af Amer: 49 mL/min — ABNORMAL LOW (ref >60)
Glucose, Bld: 100 mg/dL — ABNORMAL HIGH (ref 70–99)
Potassium: 4.4 mmol/L (ref 3.5–5.1)
Sodium: 139 mmol/L (ref 135–145)

## 2018-09-10 MED ORDER — IOHEXOL 350 MG/ML SOLN
75.0000 mL | Freq: Once | INTRAVENOUS | Status: AC | PRN
  Administered 2018-09-10: 75 mL via INTRAVENOUS

## 2018-09-10 NOTE — Addendum Note (Signed)
Addended by: Kerry Dory on: 09/10/2018 03:14 PM   Modules accepted: Orders

## 2018-09-10 NOTE — Telephone Encounter (Signed)
-----   Message from Larey Dresser, MD sent at 09/10/2018  1:14 PM EDT ----- Stable labs, he can get CTA chest to rule out PE today.

## 2018-09-10 NOTE — Telephone Encounter (Signed)
During patients labs appt patient/ patients daughter reports 1. He is not better after increasing lasix 2. Aware of stress test results 3. Labs stable ok to proceed with CTA chest r/p PE per Dr. Aundra Dubin  Orders placed

## 2018-09-11 ENCOUNTER — Other Ambulatory Visit (HOSPITAL_COMMUNITY): Payer: Self-pay

## 2018-09-11 ENCOUNTER — Telehealth (HOSPITAL_COMMUNITY): Payer: Self-pay

## 2018-09-11 DIAGNOSIS — M25662 Stiffness of left knee, not elsewhere classified: Secondary | ICD-10-CM | POA: Diagnosis not present

## 2018-09-11 MED ORDER — ASPIRIN EC 81 MG PO TBEC: 81.0000 mg | DELAYED_RELEASE_TABLET | Freq: Every day | ORAL | 3 refills | Status: AC

## 2018-09-11 NOTE — Telephone Encounter (Signed)
-----   Message from Larey Dresser, MD sent at 09/10/2018 10:25 PM EDT ----- No PE, no acute disease.

## 2018-09-11 NOTE — Telephone Encounter (Signed)
Pts daughter inquiring about dose of aspirin. She notes that during last visit dr noted patient decreasing dose from 325mg  to 81mg .  MD notes reflects that recommendation.  Med changed to 81mg  and daughter verbalized understanding.

## 2018-09-11 NOTE — Telephone Encounter (Signed)
Aware of ct scan results

## 2018-09-17 ENCOUNTER — Telehealth: Payer: Self-pay

## 2018-09-17 DIAGNOSIS — G4730 Sleep apnea, unspecified: Secondary | ICD-10-CM

## 2018-09-17 NOTE — Addendum Note (Signed)
Addended by: Kerry Dory on: 09/17/2018 04:04 PM   Modules accepted: Orders

## 2018-09-17 NOTE — Telephone Encounter (Signed)
Received monitor strips from [employer] from 7/20 at 9:43 pm and 7/21 at 4:49 am.Faxed to Dr.McLean's office at [phone].

## 2018-09-17 NOTE — Telephone Encounter (Signed)
Monitor strips reviewed by Amy Clegg,NP/Dr Bensimhon,MD  report analysis: idioventricular rhythm, sinus bradycardia w/1st degree AV block/ PVC's (1 in 1 min)  Per Dr Haroldine Laws Order in home sleep study for ?sleep apnea Order placed Patient aware and voiced understanding

## 2018-09-18 ENCOUNTER — Telehealth (HOSPITAL_COMMUNITY): Payer: Self-pay

## 2018-09-18 DIAGNOSIS — M25662 Stiffness of left knee, not elsewhere classified: Secondary | ICD-10-CM | POA: Diagnosis not present

## 2018-09-18 NOTE — Telephone Encounter (Signed)
Pts daughter called stating that patient informed her of a device that will come to home. She wanted to know what that was.  Advised per yesterdays note pt will be having a sleep study device sent to home based on monitor strip results from 7/20 and 7/21 indicating low HR/1st degree HB.  Advised it will come with instructions and if she has any questions to call office. Verbalized understanding.

## 2018-09-20 ENCOUNTER — Encounter (HOSPITAL_COMMUNITY): Payer: Self-pay | Admitting: Adult Health

## 2018-09-20 ENCOUNTER — Telehealth (HOSPITAL_COMMUNITY): Payer: Self-pay | Admitting: Adult Health

## 2018-09-20 DIAGNOSIS — M25662 Stiffness of left knee, not elsewhere classified: Secondary | ICD-10-CM | POA: Diagnosis not present

## 2018-09-20 NOTE — Telephone Encounter (Signed)
LVM on number daughter left on triage line this am [phone] stating if he was at all symptomatic to go to ED if not to come to appt 7/30 with DM

## 2018-09-20 NOTE — Telephone Encounter (Signed)
Attempting to contact patient No answer, unable to leave message

## 2018-09-20 NOTE — Telephone Encounter (Signed)
   Please call.  Received fax regarding mobile telemetry.  9 beats VT, and Sinus Brady with 1st degree AV block.   Please set up for BMEt and Mag next week.   Azure Budnick NP-C  3:54 PM

## 2018-09-23 ENCOUNTER — Encounter (INDEPENDENT_AMBULATORY_CARE_PROVIDER_SITE_OTHER): Payer: Medicare Other | Admitting: Cardiology

## 2018-09-23 DIAGNOSIS — G4733 Obstructive sleep apnea (adult) (pediatric): Secondary | ICD-10-CM | POA: Diagnosis not present

## 2018-09-23 NOTE — Telephone Encounter (Signed)
Called pt's daughter. Reviewed his telemetry event that happened on Friday. Pt daughter verbalized understanding. Advise the plan was to see dr. Aundra Dubin this week with lab work. Also advised that if he has a rhythm like previously that is symptomatic that he should report to the emergency room.

## 2018-09-24 DIAGNOSIS — M25662 Stiffness of left knee, not elsewhere classified: Secondary | ICD-10-CM | POA: Diagnosis not present

## 2018-09-26 ENCOUNTER — Ambulatory Visit (HOSPITAL_COMMUNITY)
Admission: RE | Admit: 2018-09-26 | Discharge: 2018-09-26 | Disposition: A | Discharge status: Home / Self Care | Payer: Medicare Other | Source: Ambulatory Visit | Attending: Cardiology | Admitting: Cardiology

## 2018-09-26 ENCOUNTER — Telehealth (HOSPITAL_COMMUNITY): Payer: Self-pay

## 2018-09-26 ENCOUNTER — Other Ambulatory Visit: Payer: Self-pay

## 2018-09-26 ENCOUNTER — Encounter (HOSPITAL_COMMUNITY): Payer: Self-pay | Admitting: Cardiology

## 2018-09-26 VITALS — BP 140/62 | HR 58 | Wt 131.0 lb

## 2018-09-26 DIAGNOSIS — I5032 Chronic diastolic (congestive) heart failure: Secondary | ICD-10-CM | POA: Insufficient documentation

## 2018-09-26 DIAGNOSIS — I350 Nonrheumatic aortic (valve) stenosis: Secondary | ICD-10-CM | POA: Diagnosis not present

## 2018-09-26 DIAGNOSIS — E611 Iron deficiency: Secondary | ICD-10-CM | POA: Diagnosis not present

## 2018-09-26 DIAGNOSIS — I110 Hypertensive heart disease with heart failure: Secondary | ICD-10-CM | POA: Diagnosis not present

## 2018-09-26 DIAGNOSIS — Z7982 Long term (current) use of aspirin: Secondary | ICD-10-CM | POA: Diagnosis not present

## 2018-09-26 DIAGNOSIS — Z9889 Other specified postprocedural states: Secondary | ICD-10-CM | POA: Diagnosis not present

## 2018-09-26 DIAGNOSIS — Z8674 Personal history of sudden cardiac arrest: Secondary | ICD-10-CM | POA: Diagnosis not present

## 2018-09-26 DIAGNOSIS — Z8249 Family history of ischemic heart disease and other diseases of the circulatory system: Secondary | ICD-10-CM | POA: Insufficient documentation

## 2018-09-26 DIAGNOSIS — Z79899 Other long term (current) drug therapy: Secondary | ICD-10-CM | POA: Insufficient documentation

## 2018-09-26 DIAGNOSIS — D649 Anemia, unspecified: Secondary | ICD-10-CM | POA: Insufficient documentation

## 2018-09-26 DIAGNOSIS — N400 Enlarged prostate without lower urinary tract symptoms: Secondary | ICD-10-CM | POA: Diagnosis not present

## 2018-09-26 DIAGNOSIS — Z951 Presence of aortocoronary bypass graft: Secondary | ICD-10-CM | POA: Diagnosis not present

## 2018-09-26 DIAGNOSIS — I252 Old myocardial infarction: Secondary | ICD-10-CM | POA: Insufficient documentation

## 2018-09-26 DIAGNOSIS — E785 Hyperlipidemia, unspecified: Secondary | ICD-10-CM | POA: Insufficient documentation

## 2018-09-26 DIAGNOSIS — Z7902 Long term (current) use of antithrombotics/antiplatelets: Secondary | ICD-10-CM | POA: Diagnosis not present

## 2018-09-26 DIAGNOSIS — I341 Nonrheumatic mitral (valve) prolapse: Secondary | ICD-10-CM | POA: Diagnosis not present

## 2018-09-26 DIAGNOSIS — I2510 Atherosclerotic heart disease of native coronary artery without angina pectoris: Secondary | ICD-10-CM | POA: Diagnosis not present

## 2018-09-26 LAB — IRON AND TIBC
Iron: 67 ug/dL (ref 45–182)
Saturation Ratios: 22 % (ref 17.9–39.5)
TIBC: 311 ug/dL (ref 250–450)
UIBC: 244 ug/dL

## 2018-09-26 LAB — FERRITIN: Ferritin: 87 ng/mL (ref 24–336)

## 2018-09-26 LAB — BASIC METABOLIC PANEL
Anion gap: 8 (ref 5–15)
BUN: 31 mg/dL — ABNORMAL HIGH (ref 8–23)
CO2: 21 mmol/L — ABNORMAL LOW (ref 22–32)
Calcium: 9.2 mg/dL (ref 8.9–10.3)
Chloride: 109 mmol/L (ref 98–111)
Creatinine, Ser: 1.4 mg/dL — ABNORMAL HIGH (ref 0.61–1.24)
GFR calc Af Amer: 52 mL/min — ABNORMAL LOW (ref >60)
GFR calc non Af Amer: 45 mL/min — ABNORMAL LOW (ref >60)
Glucose, Bld: 84 mg/dL (ref 70–99)
Potassium: 4.5 mmol/L (ref 3.5–5.1)
Sodium: 138 mmol/L (ref 135–145)

## 2018-09-26 MED ORDER — FUROSEMIDE 40 MG PO TABS: ORAL_TABLET | ORAL | 6 refills | Status: DC

## 2018-09-26 NOTE — Progress Notes (Addendum)
Patient ID: [name] [name], male   DOB: [birth year], 85 y.o.   MRN: [id number] PCP: Dr. Chilton Si Cardiology: Dr. Shirlee Latch  85 y.o.with history of CAD s/p CABG and mitral regurgitation s/p mitral valve repair presents for followup of CAD and weakness.     Patient had most recent echo in 5/20, showing EF 50-55% with inferior hypokinesis, mild AS, mild-moderate AI, s/p MV repair with stable mitral valve.    In 6/20, he had left TKR.  Cardiology was consulted while he was in the hospital for ?atrial fibrillation, but this was thought to actually be frequent PACs versus MAT.    At last appointment, he reported significant fatigue and dyspnea post-op. CTA chest showed no PE or other active disease.  Cardiolite was done in 7/20 showing EF 61%, small fixed basal anterior defect, no ischemia.  I had him increase Lasix to 40 mg daily due to concern for possible diastolic CHF.  Finally, he has been wearing a 30 day monitor.   Weight is down about 2 lbs.  He still gets fatigued with prolonged activity and is short of breath with stairs/inclines.  However, he is overall doing better.  He is able to walk around his house without significant dyspnea.  No chest pain.  No orthopnea/PND.  No palpitations.  No BRBPR/melena.  Given fatigue and daytime sleepiness, OSA is a concern.   Labs (2/14): K 4, creatinine 1.1 Labs (1/15): K 4, creatinine 1.1, LDL 64, HDL 57, pro-BNP 112 Labs (5/03): K 4.1, creatinine 1.03, LDL 66, HDL 53 Labs (5/17): LDL 57, HDL 71 Labs (7/17): K 4.2, creatinine 1.12 Labs (8/18): K 4.3, creatinine 1.07, LDL 58 Labs (1/20): LDL 66 Labs (6/20): K 3.6, creatinine 1.21, hgb 7.3 Labs (7/20): K 4.4, creatinine 1.3, BNP 165, hgb 7.5 => 8.2.   ECG (personally reviewed, 6/20): NSR with possible run of MAT ECG (personally reviewed, today): NSR, PACs, iRBBB  PMH: 1. CAD: Acute MI in 2006 with ventricular fibrillation arrest.  Patient had 1st generation DES to LCx.  Patient then had CABG 2009 with LIMA-OM  and SVG-PDA.  Plan has been to continue DAPT long-term because of 1st generation DES.  - Cardiolite (7/20): EF 61%, small fixed basal anterior defect, no ischemia. 2. Nephrolithiasis 3. HTN 4. Hyperlipidemia 5. Diverticulosis 6. Aortic stenosis: Mild on 7/20 echo. 7. Mitral valve prolapse with severe MR: MV repair in 2009.  Echo (2/14) with EF 50-55%, inferior HK, mild AS with mean gradient 12 mmHg, s/p MV repair with mild MR and no significant MS (mean gradient 3 mmHg), mild RV dilation, PA systolic pressure 37 mmHg.  Echo (3/16) with EF 50-55%, mild focal basal septal hypertrophy, mild aortic stenosis mean gradient 14 mmHg, s/p MV repair with mild MR and no MS, PASP 38 mmHg.  - Echo (5/20): EF 50-55% with inferior hypokinesis, mild AS, mild-moderate AI, s/p MV repair with stable mitral valve. 8. Thyroid nodule: Biopsy with benign follicular nodule in 2018. 9. 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   FH: HTN  ROS: All systems reviewed and negative except as per HPI.   Current Outpatient Medications  Medication Sig Dispense Refill  . aspirin EC 81 MG tablet Take 1 tablet (81 mg total) by mouth daily. 90 tablet 3  . clopidogrel (PLAVIX) 75 MG tablet TAKE 1 TABLET ONCE DAILY. 90 tablet 0  . ferrous sulfate (FERROUSUL) 325 (65 FE) MG tablet Take 1 tablet (325 mg total) by mouth 3 (  three) times daily with meals.  3  . lisinopril (ZESTRIL) 5 MG tablet Take 1 tablet (5 mg total) by mouth daily. 30 tablet 6  . Multiple Vitamin (MULTIVITAMIN) tablet Take 1 tablet by mouth daily.      . potassium chloride SA (K-DUR) 20 MEQ tablet Take 1 tablet (20 mEq total) by mouth daily. 30 tablet 6  . simvastatin (ZOCOR) 40 MG tablet TAKE 1 TABLET ONCE DAILY. (Patient taking differently: Take 40 mg by mouth daily. ) 90 tablet 0  . Tamsulosin HCl (FLOMAX) 0.4 MG CAPS Take 0.4 mg by mouth daily.     . furosemide (LASIX) 40 MG tablet Take 1 tablet (40mg ) alternating with  a half tablet (20mg ) every other day 30 tablet 6   No current facility-administered medications for this encounter.     BP 140/62   Pulse (!) 58   Wt 59.4 kg (131 lb)   SpO2 98%   BMI 19.92 kg/m  General: NAD Neck: No JVD, no thyromegaly or thyroid nodule.  Lungs: Clear to auscultation bilaterally with normal respiratory effort. CV: Nondisplaced PMI.  Heart regular S1/S2, no S3/S4, 2/6 SEM RUSB with clear S2.  No peripheral edema.  No carotid bruit.  Normal pedal pulses.  Abdomen: Soft, nontender, no hepatosplenomegaly, no distention.  Skin: Intact without lesions or rashes.  Neurologic: Alert and oriented x 3.  Psych: Normal affect. Extremities: No clubbing or cyanosis.  HEENT: Normal.   Assessment/Plan: 1. CAD: S/p 1st generation DES to LCx and later CABG.  He has had no chest pain but has significant exertional dyspnea since his left TKR. Cardiolite was done in 7/20, this showed no ischemia.  - Patient has been on ASA and Plavix long-term and plan to continue this regimen.    - Continue statin.  2. S/p MV repair: MV stable on 5/20 echo.   3. Aortic stenosis: Mild on 5/20 echo. 4. Hyperlipidemia: Continue statin.  Good lipids in 1/20.   5. HTN: BP better on lower lisinopril (had been low).  6. Chronic diastolic CHF: Echo in 3/24 with EF 50-55%.  At last appointment, Lasix was increased.  This has helped his symptoms and weight is down.  He does not look volume overloaded on exam.  - Continue Lasix 40 mg daily.  BMET today.  - Ultimately, I think that the cause of his worsened dyspnea and fatigue was a combination of diastolic CHF/volume retention peri-surgery and deconditioning.   7. Anemia: No overt GI bleeding, suspect blood loss anemia related to surgery. This may be contributing to his dyspnea.  Hemoglobin has been rising.   - Check Fe studies.   8. Atrial arrhythmias: In the hospital, it appears that only PACs versus MAT were seen, no definite atrial fibrillation.   - He is  wearing 30 day monitor, will need anticoagulation if atrial fibrillation is seen.  9. Fatigue/daytime sleepiness: I will arrange for sleep study.   Followup 2 months.   Loralie Champagne 09/26/2018

## 2018-09-26 NOTE — Telephone Encounter (Signed)
-----   Message from Larey Dresser, MD sent at 09/26/2018 12:57 PM EDT ----- Creatinine mildly higher.  Decrease Lasix to 40 mg daily alternating with 20 mg daily.  BMET 2-3 weeks.

## 2018-09-26 NOTE — Patient Instructions (Signed)
NO medication changes.   Labs today We will only contact you if something comes back abnormal or we need to make some changes. Otherwise no news is good news!  Your physician recommends that you schedule a follow-up appointment in: 2 months with Dr Aundra Dubin  At the [facility], you and your health needs are our priority. As part of our continuing mission to provide you with exceptional heart care, we have created designated Provider Care Teams. These Care Teams include your primary Cardiologist (physician) and Advanced Practice Providers (APPs- Physician Assistants and Nurse Practitioners) who all work together to provide you with the care you need, when you need it.   You may see any of the following providers on your designated Care Team at your next follow up: Marland Kitchen Dr Glori Bickers . Dr Loralie Champagne . Darrick Grinder, NP

## 2018-09-26 NOTE — Telephone Encounter (Signed)
Spoke with patients daughter Langley Gauss, made aware of lab results and med recommendations.  Will rtc in 2 weeks for lab work.  Verbalized understanding.

## 2018-09-27 ENCOUNTER — Encounter: Payer: Self-pay | Admitting: Cardiology

## 2018-09-27 DIAGNOSIS — M25662 Stiffness of left knee, not elsewhere classified: Secondary | ICD-10-CM | POA: Diagnosis not present

## 2018-10-01 ENCOUNTER — Ambulatory Visit: Payer: Medicare Other

## 2018-10-01 NOTE — Procedures (Signed)
     Patient Information Name: [name] [name]  ID: [id number] Birth Date: [birth year]  Age: 85  Gender: Male Study Date:09/23/2018 Referring Physician:  Loralie Champagne, MD  TEST DESCRIPTION: Home sleep apnea testing was completed using the WatchPat, a Type 1 device, utilizing peripheral arterial tonometry (PAT), chest movement, actigraphy, pulse oximetry, pulse rate, body position and snore. AHI was calculated with apnea and hypopnea using valid sleep time as the denominator. RDI includes apneas, hypopneas, and RERAs. The data acquired and the scoring of sleep and all associated events were performed in accordance with the recommended standards and specifications as outlined in the AASM Manual for the Scoring of Sleep and Associated Events 2.2.0 (2015).  DIAGNOSIS: 1. Mild to moderate Obstructive Sleep Apnea (G47.33) with AHI 14.3/hr overall and 26.7/hr in REM sleep. 2.  Mild Central Sleep Apnea with pAHIc 8.6/hr. 3. Minimum O2 saturation was 87% and time spent with O2 sats < 88% was 0.1 min. 4. Total sleep time was 7hrs 66min. 5. Mild to moderate snoring was noted. 6. Mildly prolonged sleep onset latency at 29 min. 7. Shortened REM sleep latency at 51 min. 8. Increase frequent of arousals at 16.  RECOMMENDATIONS: 1. Findings are consistent with mild to moderate OSA. For symptomatic mild obstructive sleep apnea, patient preference and compliance impacts efficacious outcomes. Therapeutic options include:  a. The patient may benefit from the use of a nocturnal mandibular repositioning appliance. If that line of therapy is to be pursued the patient should be evaluated by a dentist trained in the treatment of sleep related breathing disorders.  b. An ENT consultation which may be useful for specific causes of obstruction and possible treatment options .  c. Consider treatment with nasal continuous positive airway pressure (CPAP). If the patient chooses CPAP therapy, a nocturnal PSG with CPAP  titration is recommended. As an alternative, an Auto PAP with pressure range 5-20cm H2O with download is an option.  2. Weight loss may be of benefit in reducing the severity of respiratory events and snoring .  3. Routine follow-up efficacy testing should be performed.  Report prepared by: Signature: Fransico Him  Electronically Signed: Oct 01, 2018

## 2018-10-03 ENCOUNTER — Other Ambulatory Visit (HOSPITAL_COMMUNITY): Payer: Self-pay | Admitting: Cardiology

## 2018-10-03 DIAGNOSIS — M25662 Stiffness of left knee, not elsewhere classified: Secondary | ICD-10-CM | POA: Diagnosis not present

## 2018-10-07 DIAGNOSIS — M25662 Stiffness of left knee, not elsewhere classified: Secondary | ICD-10-CM | POA: Diagnosis not present

## 2018-10-08 ENCOUNTER — Telehealth: Payer: Self-pay | Admitting: *Deleted

## 2018-10-08 ENCOUNTER — Other Ambulatory Visit: Payer: Self-pay | Admitting: Medical

## 2018-10-08 DIAGNOSIS — I4891 Unspecified atrial fibrillation: Secondary | ICD-10-CM

## 2018-10-08 NOTE — Telephone Encounter (Signed)
-----   Message from Sueanne Margarita, MD sent at 10/01/2018 10:08 PM EDT ----- Please let patient know that they have sleep apnea and recommend auto CPAP titration through [facility].  Orders have been placed in Epic. Please set 10 week OV with me.

## 2018-10-08 NOTE — Telephone Encounter (Addendum)
Informed patient/Daughter of sleep study results and patient understanding was verbalized. Patient/daugther understands his sleep study showed they have sleep apnea and recommend auto CPAP titration through [facility]. Orders have been placed in Epic. Please set 10 week OV with me.  Upon patient/daughter request DME selection is [facility] Patient understands he will be contacted by [facility] to set up his cpap. Patient/daughter understands to call if [facility] does not contact him with new setup in a timely manner. Patient/daughter understands they will be called once confirmation has been received from [facility] that they have received their new machine to schedule 10 week follow up appointment.  [facility] notified of new cpap order  Please add to [facility] Patient/daughter was grateful for the call and thanked me.

## 2018-10-09 NOTE — Addendum Note (Signed)
Encounter addended by: Larey Dresser, MD on: 10/09/2018 9:10 AM  Actions taken: Clinical Note Signed

## 2018-10-10 ENCOUNTER — Other Ambulatory Visit: Payer: Self-pay

## 2018-10-10 ENCOUNTER — Ambulatory Visit (HOSPITAL_COMMUNITY)
Admission: RE | Admit: 2018-10-10 | Discharge: 2018-10-10 | Disposition: A | Discharge status: Home / Self Care | Payer: Medicare Other | Source: Ambulatory Visit | Attending: Internal Medicine | Admitting: Internal Medicine

## 2018-10-10 DIAGNOSIS — I5032 Chronic diastolic (congestive) heart failure: Secondary | ICD-10-CM | POA: Diagnosis not present

## 2018-10-10 DIAGNOSIS — M25662 Stiffness of left knee, not elsewhere classified: Secondary | ICD-10-CM | POA: Diagnosis not present

## 2018-10-10 LAB — BASIC METABOLIC PANEL
Anion gap: 9 (ref 5–15)
BUN: 33 mg/dL — ABNORMAL HIGH (ref 8–23)
CO2: 22 mmol/L (ref 22–32)
Calcium: 9.1 mg/dL (ref 8.9–10.3)
Chloride: 108 mmol/L (ref 98–111)
Creatinine, Ser: 1.42 mg/dL — ABNORMAL HIGH (ref 0.61–1.24)
GFR calc Af Amer: 51 mL/min — ABNORMAL LOW (ref >60)
GFR calc non Af Amer: 44 mL/min — ABNORMAL LOW (ref >60)
Glucose, Bld: 109 mg/dL — ABNORMAL HIGH (ref 70–99)
Potassium: 4 mmol/L (ref 3.5–5.1)
Sodium: 139 mmol/L (ref 135–145)

## 2018-10-14 DIAGNOSIS — M25662 Stiffness of left knee, not elsewhere classified: Secondary | ICD-10-CM | POA: Diagnosis not present

## 2018-10-15 DIAGNOSIS — M1712 Unilateral primary osteoarthritis, left knee: Secondary | ICD-10-CM | POA: Diagnosis not present

## 2018-10-15 DIAGNOSIS — I10 Essential (primary) hypertension: Secondary | ICD-10-CM | POA: Diagnosis not present

## 2018-10-15 DIAGNOSIS — I2510 Atherosclerotic heart disease of native coronary artery without angina pectoris: Secondary | ICD-10-CM | POA: Diagnosis not present

## 2018-10-15 DIAGNOSIS — D649 Anemia, unspecified: Secondary | ICD-10-CM | POA: Diagnosis not present

## 2018-10-15 NOTE — Telephone Encounter (Signed)
[facility] is not in network with patients insurance [employer]  I will send to our local dme [facility].

## 2018-10-17 DIAGNOSIS — M25662 Stiffness of left knee, not elsewhere classified: Secondary | ICD-10-CM | POA: Diagnosis not present

## 2018-10-21 DIAGNOSIS — M25662 Stiffness of left knee, not elsewhere classified: Secondary | ICD-10-CM | POA: Diagnosis not present

## 2018-10-24 ENCOUNTER — Other Ambulatory Visit: Payer: Self-pay

## 2018-10-24 DIAGNOSIS — M25662 Stiffness of left knee, not elsewhere classified: Secondary | ICD-10-CM | POA: Diagnosis not present

## 2018-10-31 NOTE — Addendum Note (Signed)
Encounter addended by: Larey Dresser, MD on: 10/31/2018 11:50 AM  Actions taken: Clinical Note Signed

## 2018-11-01 ENCOUNTER — Other Ambulatory Visit (HOSPITAL_COMMUNITY): Payer: Self-pay | Admitting: Cardiology

## 2018-11-06 DIAGNOSIS — Z471 Aftercare following joint replacement surgery: Secondary | ICD-10-CM | POA: Diagnosis not present

## 2018-11-06 DIAGNOSIS — Z96652 Presence of left artificial knee joint: Secondary | ICD-10-CM | POA: Diagnosis not present

## 2018-11-14 DIAGNOSIS — G4733 Obstructive sleep apnea (adult) (pediatric): Secondary | ICD-10-CM | POA: Diagnosis not present

## 2018-11-19 DIAGNOSIS — I10 Essential (primary) hypertension: Secondary | ICD-10-CM | POA: Diagnosis not present

## 2018-11-19 DIAGNOSIS — Z23 Encounter for immunization: Secondary | ICD-10-CM | POA: Diagnosis not present

## 2018-11-19 DIAGNOSIS — I2510 Atherosclerotic heart disease of native coronary artery without angina pectoris: Secondary | ICD-10-CM | POA: Diagnosis not present

## 2018-11-19 DIAGNOSIS — D649 Anemia, unspecified: Secondary | ICD-10-CM | POA: Diagnosis not present

## 2018-11-25 ENCOUNTER — Telehealth: Payer: Self-pay | Admitting: *Deleted

## 2018-11-25 NOTE — Telephone Encounter (Signed)

## 2018-11-29 ENCOUNTER — Encounter (HOSPITAL_COMMUNITY): Payer: Medicare Other | Admitting: Cardiology

## 2018-11-29 ENCOUNTER — Telehealth (HOSPITAL_COMMUNITY): Payer: Self-pay

## 2018-11-29 NOTE — Telephone Encounter (Signed)
LM for patient to return call to office. He missed his appt this morning.

## 2018-12-11 ENCOUNTER — Other Ambulatory Visit (HOSPITAL_COMMUNITY): Payer: Self-pay

## 2018-12-11 MED ORDER — LISINOPRIL 5 MG PO TABS: 5.0000 mg | ORAL_TABLET | Freq: Every day | ORAL | 6 refills | Status: DC

## 2018-12-13 ENCOUNTER — Other Ambulatory Visit: Payer: Self-pay | Admitting: Internal Medicine

## 2018-12-14 DIAGNOSIS — G4733 Obstructive sleep apnea (adult) (pediatric): Secondary | ICD-10-CM | POA: Diagnosis not present

## 2018-12-27 DIAGNOSIS — R0602 Shortness of breath: Secondary | ICD-10-CM | POA: Diagnosis not present

## 2018-12-27 DIAGNOSIS — I340 Nonrheumatic mitral (valve) insufficiency: Secondary | ICD-10-CM | POA: Diagnosis not present

## 2018-12-27 DIAGNOSIS — Z7982 Long term (current) use of aspirin: Secondary | ICD-10-CM | POA: Diagnosis not present

## 2018-12-27 DIAGNOSIS — Z79899 Other long term (current) drug therapy: Secondary | ICD-10-CM | POA: Diagnosis not present

## 2018-12-27 DIAGNOSIS — Z7902 Long term (current) use of antithrombotics/antiplatelets: Secondary | ICD-10-CM | POA: Diagnosis not present

## 2018-12-27 DIAGNOSIS — I10 Essential (primary) hypertension: Secondary | ICD-10-CM | POA: Diagnosis not present

## 2018-12-27 DIAGNOSIS — I351 Nonrheumatic aortic (valve) insufficiency: Secondary | ICD-10-CM | POA: Diagnosis not present

## 2019-01-06 ENCOUNTER — Telehealth: Payer: Self-pay | Admitting: *Deleted

## 2019-01-06 ENCOUNTER — Encounter: Payer: Self-pay | Admitting: *Deleted

## 2019-01-06 ENCOUNTER — Other Ambulatory Visit: Payer: Self-pay

## 2019-01-06 ENCOUNTER — Telehealth (INDEPENDENT_AMBULATORY_CARE_PROVIDER_SITE_OTHER): Payer: Medicare Other | Admitting: Cardiology

## 2019-01-06 ENCOUNTER — Encounter: Payer: Self-pay | Admitting: Cardiology

## 2019-01-06 VITALS — Ht 68.0 in | Wt 140.0 lb

## 2019-01-06 DIAGNOSIS — I10 Essential (primary) hypertension: Secondary | ICD-10-CM

## 2019-01-06 DIAGNOSIS — I5032 Chronic diastolic (congestive) heart failure: Secondary | ICD-10-CM | POA: Diagnosis not present

## 2019-01-06 DIAGNOSIS — G4733 Obstructive sleep apnea (adult) (pediatric): Secondary | ICD-10-CM | POA: Diagnosis not present

## 2019-01-06 NOTE — Telephone Encounter (Signed)
Sueanne Margarita, MD  Freada Bergeron, CMA        [name] [name]     ----- Message -----  From: Freada Bergeron, CMA  Sent: 01/06/2019  1:15 PM EST  To: Sueanne Margarita, MD   Who is the patient Dr Radford Pax?  ----- Message -----  From: Sueanne Margarita, MD  Sent: 01/06/2019  8:39 AM EST  To: Freada Bergeron, CMA   Please call patient to help him get online to follow his PAP results. Followup with me in 1 year

## 2019-01-06 NOTE — Telephone Encounter (Addendum)
Patient says he will get his daughter to help him get online and check his results and call our office back Tuesday 01/07/19.

## 2019-01-06 NOTE — Telephone Encounter (Signed)
Mask repeat check Order placed to [facility]. D/L reminder set for 4 weeks

## 2019-01-06 NOTE — Patient Instructions (Signed)
Medication Instructions:   Your physician recommends that you continue on your current medications as directed. Please refer to the Current Medication list given to you today.  *If you need a refill on your cardiac medications before your next appointment, please call your pharmacy*    Follow-Up: At [facility], you and your health needs are our priority.  As part of our continuing mission to provide you with exceptional heart care, we have created designated Provider Care Teams.  These Care Teams include your primary Cardiologist (physician) and Advanced Practice Providers (APPs -  Physician Assistants and Nurse Practitioners) who all work together to provide you with the care you need, when you need it.  Your next appointment:   12 months  The format for your next appointment:   Either In Person or Virtual-For sleep  Provider:   Traci Turner, MD    

## 2019-01-06 NOTE — Progress Notes (Signed)
Virtual Visit via Telephone Note   This visit type was conducted due to national recommendations for restrictions regarding the COVID-19 Pandemic (e.g. social distancing) in an effort to limit this patient's exposure and mitigate transmission in our community.  Due to his co-morbid illnesses, this patient is at least at moderate risk for complications without adequate follow up.  This format is felt to be most appropriate for this patient at this time.  All issues noted in this document were discussed and addressed.  A limited physical exam was performed with this format.  Please refer to the patient's chart for his consent to telehealth for [facility].   Evaluation Performed:  Follow-up visit  This visit type was conducted due to national recommendations for restrictions regarding the COVID-19 Pandemic (e.g. social distancing).  This format is felt to be most appropriate for this patient at this time.  All issues noted in this document were discussed and addressed.  No physical exam was performed (except for noted visual exam findings with Video Visits).  Please refer to the patient's chart (MyChart message for video visits and phone note for telephone visits) for the patient's consent to telehealth for [facility].  Date:  01/06/2019   ID:  [name] [name], DOB [birth year], MRN [id number]  Patient Location:  Home  Provider location:   [place]  PCP:  Nila Nephew, MD  Cardiologist:Dalton Shirlee Latch, MD Sleep Medicine:  Armanda Magic, MD Electrophysiologist:  None   Chief Complaint:  OSA  History of Present Illness:    DOM HAVERLAND is a 85 y.o. male who presents via audio/video conferencing for a telehealth visit today.    This is an 85yo male with a hx of CAD, HTN, s/p MV repair and chronic diastolic CHF.  He was having excessive daytime sleepiness and was referred for sleep study.  He had mild to moderate OSA with an AHI of 14.2/hr on home sleep study.  He is now on auto CPAP.  He  is doing well with his CPAP device and thinks that he has gotten used to it.  He tolerates the mask and feels the pressure is adequate.  Since going on CPAP he feels rested in the am and has no significant daytime sleepiness.  He denies any significant mouth or nasal dryness or nasal congestion.  He does not think that he snores.    The patient does not have symptoms concerning for COVID-19 infection (fever, chills, cough, or new shortness of breath).   Prior CV studies:   The following studies were reviewed today:  PAP compliance download  Past Medical History:  Diagnosis Date  . Acute MI (HCC)   . Coronary artery disease   . Diverticulosis 06-12-2003   Colonoscopy  . History of kidney stones   . Hyperlipidemia   . Hypertension   . MVP (mitral valve prolapse)   . Undiagnosed cardiac murmurs   . Urinary retention    Past Surgical History:  Procedure Laterality Date  . CARDIAC CATHETERIZATION    . CORONARY ANGIOPLASTY    . MITRAL VALVE REPAIR     2/09  . TOTAL KNEE ARTHROPLASTY Left 08/16/2018   Procedure: TOTAL KNEE ARTHROPLASTY;  Surgeon: Eugenia Mcalpine, MD;  Location: [facility];  Service: Orthopedics;  Laterality: Left;  with adductor canal     Current Meds  Medication Sig  . aspirin EC 81 MG tablet Take 1 tablet (81 mg total) by mouth daily.  . clopidogrel (PLAVIX) 75 MG tablet TAKE 1  TABLET ONCE DAILY.  . ferrous sulfate (FERROUSUL) 325 (65 FE) MG tablet Take 1 tablet (325 mg total) by mouth 3 (three) times daily with meals.  . furosemide (LASIX) 20 MG tablet TAKE 1 TABLET BY MOUTH DAILY.  . furosemide (LASIX) 40 MG tablet Take 1 tablet (40mg ) alternating with a half tablet (20mg ) every other day  . lisinopril (ZESTRIL) 5 MG tablet Take 1 tablet (5 mg total) by mouth daily.  . Multiple Vitamin (MULTIVITAMIN) tablet Take 1 tablet by mouth daily.    . potassium chloride SA (K-DUR) 20 MEQ tablet Take 1 tablet (20 mEq total) by mouth daily.  . simvastatin (ZOCOR) 40 MG tablet  TAKE 1 TABLET ONCE DAILY.  . Tamsulosin HCl (FLOMAX) 0.4 MG CAPS Take 0.4 mg by mouth daily.      Allergies:   Patient has no known allergies.   Social History   Tobacco Use  . Smoking status: Never Smoker  . Smokeless tobacco: Never Used  Substance Use Topics  . Alcohol use: Yes    Comment: rare  . Drug use: No     Family Hx: The patient's family history includes Hypertension in an other family member. There is no history of Colon cancer.  ROS:   Please see the history of present illness.     All other systems reviewed and are negative.   Labs/Other Tests and Data Reviewed:    Recent Labs: 09/02/2018: B Natriuretic Peptide 164.5; Hemoglobin 8.2; Platelets 459 10/10/2018: BUN 33; Creatinine, Ser 1.42; Potassium 4.0; Sodium 139   Recent Lipid Panel Lab Results  Component Value Date/Time   CHOL 139 03/21/2018 11:18 AM   TRIG 61 03/21/2018 11:18 AM   HDL 61 03/21/2018 11:18 AM   CHOLHDL 2.3 03/21/2018 11:18 AM   LDLCALC 66 03/21/2018 11:18 AM    Wt Readings from Last 3 Encounters:  01/06/19 140 lb (63.5 kg)  09/26/18 131 lb (59.4 kg)  09/06/18 133 lb (60.3 kg)     Objective:    Vital Signs:  Ht 5\' 8"  (1.727 m)   Wt 140 lb (63.5 kg)   BMI 21.29 kg/m    ASSESSMENT & PLAN:    1.  OSA - The pathophysiology of obstructive sleep apnea , it's cardiovascular consequences & modes of treatment including CPAP were discused with the patient in detail & they evidenced understanding.  The patient is tolerating PAP therapy well without any problems. He has noticed improvement in daytime sleepiness and significant decrease in nighttime awakenings. The PAP download was reviewed today and showed an AHI of 21.9/hr on auto PAPwith 27% compliance in using more than 4 hours nightly. The patient still has an elevated AHI despite auto PAP but download shows a significant mask leak.  I will set him up with DME appt to assess mask fit and then repeat download in 2 weeks.  If AHI still too  high then will set up for BiPAP titration.  2.  HTN -BP controlled -continue Lisionpril 5mg  daily  3. Chronic diastolic CHF -he denies any SOB or LE edema and weight has been stable -continue lasix 40mg  qod alt with 20mg  qod  COVID-19 Education: The signs and symptoms of COVID-19 were discussed with the patient and how to seek care for testing (follow up with PCP or arrange E-visit).  The importance of social distancing was discussed today.  Patient Risk:   After full review of this patient's clinical status, I feel that they are at least moderate risk at this  time.  Time:   Today, I have spent 20 minutes directly with the patient on teledmedicine discussing medical problems including OSA, HTN, CHF.  We also reviewed the symptoms of COVID 19 and the ways to protect against contracting the virus with telehealth technology.  I spent an additional 5 minutes reviewing patient's chart including PAP compliance downlaod.  Medication Adjustments/Labs and Tests Ordered: Current medicines are reviewed at length with the patient today.  Concerns regarding medicines are outlined above.  Tests Ordered: No orders of the defined types were placed in this encounter.  Medication Changes: No orders of the defined types were placed in this encounter.   Disposition:  Follow up in 1 year(s)  Signed, Armanda Magic, MD  01/06/2019 10:30 AM    [facility]

## 2019-01-06 NOTE — Telephone Encounter (Signed)
-----   Message from Sueanne Margarita, MD sent at 01/06/2019 10:28 AM EST ----- Please set up appt with [facility] for repeat mask check.  His mask leak is high on his download.  Get a download in 4 weeks. Followup with me in 1 year

## 2019-01-14 DIAGNOSIS — G4733 Obstructive sleep apnea (adult) (pediatric): Secondary | ICD-10-CM | POA: Diagnosis not present

## 2019-01-28 DIAGNOSIS — L821 Other seborrheic keratosis: Secondary | ICD-10-CM | POA: Diagnosis not present

## 2019-01-28 DIAGNOSIS — L814 Other melanin hyperpigmentation: Secondary | ICD-10-CM | POA: Diagnosis not present

## 2019-01-28 DIAGNOSIS — Z85828 Personal history of other malignant neoplasm of skin: Secondary | ICD-10-CM | POA: Diagnosis not present

## 2019-01-28 DIAGNOSIS — L820 Inflamed seborrheic keratosis: Secondary | ICD-10-CM | POA: Diagnosis not present

## 2019-01-28 DIAGNOSIS — D225 Melanocytic nevi of trunk: Secondary | ICD-10-CM | POA: Diagnosis not present

## 2019-01-31 ENCOUNTER — Telehealth (HOSPITAL_COMMUNITY): Payer: Self-pay

## 2019-01-31 NOTE — Telephone Encounter (Signed)
Received message from daughter Langley Gauss reporting patient has some LE swelling and sob.  Returned call to patient and Langley Gauss, no answer. Lm on denise phone to return call. Unable to leave message on pt phone

## 2019-01-31 NOTE — Telephone Encounter (Signed)
Received message from daughter Langley Gauss stating that patient received an appt for 12/15.  Called patient to assess how hes doing to see if he needs immediate intervention. Lm for patient to return call office.

## 2019-02-03 DIAGNOSIS — Z4789 Encounter for other orthopedic aftercare: Secondary | ICD-10-CM | POA: Diagnosis not present

## 2019-02-03 DIAGNOSIS — Z96652 Presence of left artificial knee joint: Secondary | ICD-10-CM | POA: Diagnosis not present

## 2019-02-03 DIAGNOSIS — Z471 Aftercare following joint replacement surgery: Secondary | ICD-10-CM | POA: Diagnosis not present

## 2019-02-06 DIAGNOSIS — H6123 Impacted cerumen, bilateral: Secondary | ICD-10-CM | POA: Diagnosis not present

## 2019-02-11 ENCOUNTER — Encounter (HOSPITAL_COMMUNITY): Payer: Self-pay | Admitting: Cardiology

## 2019-02-11 ENCOUNTER — Ambulatory Visit (HOSPITAL_COMMUNITY)
Admission: RE | Admit: 2019-02-11 | Discharge: 2019-02-11 | Disposition: A | Discharge status: Home / Self Care | Payer: Medicare Other | Source: Ambulatory Visit | Attending: Cardiology | Admitting: Cardiology

## 2019-02-11 ENCOUNTER — Other Ambulatory Visit: Payer: Self-pay

## 2019-02-11 VITALS — BP 139/60 | HR 63 | Wt 135.2 lb

## 2019-02-11 DIAGNOSIS — Z7982 Long term (current) use of aspirin: Secondary | ICD-10-CM | POA: Diagnosis not present

## 2019-02-11 DIAGNOSIS — G4733 Obstructive sleep apnea (adult) (pediatric): Secondary | ICD-10-CM

## 2019-02-11 DIAGNOSIS — Z7902 Long term (current) use of antithrombotics/antiplatelets: Secondary | ICD-10-CM | POA: Insufficient documentation

## 2019-02-11 DIAGNOSIS — I5032 Chronic diastolic (congestive) heart failure: Secondary | ICD-10-CM | POA: Diagnosis not present

## 2019-02-11 DIAGNOSIS — I2510 Atherosclerotic heart disease of native coronary artery without angina pectoris: Secondary | ICD-10-CM | POA: Diagnosis not present

## 2019-02-11 DIAGNOSIS — I472 Ventricular tachycardia: Secondary | ICD-10-CM

## 2019-02-11 DIAGNOSIS — Z8249 Family history of ischemic heart disease and other diseases of the circulatory system: Secondary | ICD-10-CM | POA: Diagnosis not present

## 2019-02-11 DIAGNOSIS — Z8674 Personal history of sudden cardiac arrest: Secondary | ICD-10-CM | POA: Diagnosis not present

## 2019-02-11 DIAGNOSIS — Z951 Presence of aortocoronary bypass graft: Secondary | ICD-10-CM | POA: Insufficient documentation

## 2019-02-11 DIAGNOSIS — I110 Hypertensive heart disease with heart failure: Secondary | ICD-10-CM | POA: Insufficient documentation

## 2019-02-11 DIAGNOSIS — I4729 Other ventricular tachycardia: Secondary | ICD-10-CM

## 2019-02-11 DIAGNOSIS — E785 Hyperlipidemia, unspecified: Secondary | ICD-10-CM | POA: Diagnosis not present

## 2019-02-11 DIAGNOSIS — E7800 Pure hypercholesterolemia, unspecified: Secondary | ICD-10-CM | POA: Diagnosis not present

## 2019-02-11 DIAGNOSIS — I252 Old myocardial infarction: Secondary | ICD-10-CM | POA: Diagnosis not present

## 2019-02-11 DIAGNOSIS — Z79899 Other long term (current) drug therapy: Secondary | ICD-10-CM | POA: Insufficient documentation

## 2019-02-11 DIAGNOSIS — I350 Nonrheumatic aortic (valve) stenosis: Secondary | ICD-10-CM | POA: Insufficient documentation

## 2019-02-11 DIAGNOSIS — Z9889 Other specified postprocedural states: Secondary | ICD-10-CM

## 2019-02-11 DIAGNOSIS — N400 Enlarged prostate without lower urinary tract symptoms: Secondary | ICD-10-CM | POA: Diagnosis not present

## 2019-02-11 LAB — LIPID PANEL
Cholesterol: 151 mg/dL (ref 0–200)
HDL: 61 mg/dL (ref >40)
LDL Cholesterol: 75 mg/dL (ref 0–99)
Total CHOL/HDL Ratio: 2.5 RATIO
Triglycerides: 74 mg/dL (ref <150)
VLDL: 15 mg/dL (ref 0–40)

## 2019-02-11 LAB — BASIC METABOLIC PANEL
Anion gap: 10 (ref 5–15)
BUN: 31 mg/dL — ABNORMAL HIGH (ref 8–23)
CO2: 22 mmol/L (ref 22–32)
Calcium: 9.2 mg/dL (ref 8.9–10.3)
Chloride: 107 mmol/L (ref 98–111)
Creatinine, Ser: 1.34 mg/dL — ABNORMAL HIGH (ref 0.61–1.24)
GFR calc Af Amer: 54 mL/min — ABNORMAL LOW (ref >60)
GFR calc non Af Amer: 47 mL/min — ABNORMAL LOW (ref >60)
Glucose, Bld: 146 mg/dL — ABNORMAL HIGH (ref 70–99)
Potassium: 4.1 mmol/L (ref 3.5–5.1)
Sodium: 139 mmol/L (ref 135–145)

## 2019-02-11 NOTE — Patient Instructions (Signed)
EKG done today!   Labs today We will only contact you if something comes back abnormal or we need to make some changes. Otherwise no news is good news!   Your provider has recommended that  you wear a Zio Patch for 3 days.  This monitor will record your heart rhythm for our review.  IF you have any symptoms while wearing the monitor please press the button.  If you have any issues with the patch or you notice a red or orange light on it please call the company at [phone].  Once you remove the patch please mail it back to the company as soon as possible so we can get the results.   Your physician recommends that you schedule a follow-up appointment in: 6 months with Dr Aundra Dubin. You will get a call to schedule this appointment.    At the [facility], you and your health needs are our priority. As part of our continuing mission to provide you with exceptional heart care, we have created designated Provider Care Teams. These Care Teams include your primary Cardiologist (physician) and Advanced Practice Providers (APPs- Physician Assistants and Nurse Practitioners) who all work together to provide you with the care you need, when you need it.   You may see any of the following providers on your designated Care Team at your next follow up: Marland Kitchen Dr Glori Bickers . Dr Loralie Champagne . Darrick Grinder, NP . Lyda Jester, PA . Audry Riles, PharmD   Please be sure to bring in all your medications bottles to every appointment.

## 2019-02-11 NOTE — Progress Notes (Signed)
Zio patch placed onto patient.  All instructions and information reviewed with patient, they verbalize understanding with no questions. 

## 2019-02-12 ENCOUNTER — Other Ambulatory Visit (HOSPITAL_COMMUNITY): Payer: Self-pay | Admitting: Cardiology

## 2019-02-12 MED ORDER — CLOPIDOGREL BISULFATE 75 MG PO TABS: 75.0000 mg | ORAL_TABLET | Freq: Every day | ORAL | 3 refills | Status: DC

## 2019-02-12 MED ORDER — POTASSIUM CHLORIDE CRYS ER 20 MEQ PO TBCR: 20.0000 meq | EXTENDED_RELEASE_TABLET | Freq: Every day | ORAL | 3 refills | Status: DC

## 2019-02-12 MED ORDER — LISINOPRIL 5 MG PO TABS: 5.0000 mg | ORAL_TABLET | Freq: Every day | ORAL | 3 refills | Status: DC

## 2019-02-12 NOTE — Progress Notes (Signed)
Patient ID: [name] [name], male   DOB: 14-Mar-1928, 85 y.o.   MRN: [id number] PCP: Dr. Nyoka Cowden Cardiology: Dr. Aundra Dubin  85 y.o.with history of CAD s/p CABG and mitral regurgitation s/p mitral valve repair presents for followup of CAD and weakness.    Patient had most recent echo in 5/20, showing EF 50-55% with inferior hypokinesis, mild AS, mild-moderate AI, s/p MV repair with stable mitral valve.    In 6/20, he had left TKR.  Cardiology was consulted while he was in the hospital for ?atrial fibrillation, but this was thought to actually be frequent PACs versus MAT.    At a prior appointment, he reported significant fatigue and dyspnea post-op. CTA chest showed no PE or other active disease.  Cardiolite was done in 7/20 showing EF 61%, small fixed basal anterior defect, no ischemia.  I had him increase Lasix to 40 mg daily due to concern for possible diastolic CHF.  He wore a 30 day monitor which showed short NSVT runs, no atrial fibrillation.  Sleep study showed severe OSA and he was started on CPAP.   Since starting on CPAP, he feels markedly better. No significant exertional dyspnea. Stamina is improving.  He walks his dogs.  No chest pain.  No palpitations. He is planning on retiring this year.   Labs (2/14): K 4, creatinine 1.1 Labs (1/15): K 4, creatinine 1.1, LDL 64, HDL 57, pro-BNP 112 Labs (2/16): K 4.1, creatinine 1.03, LDL 66, HDL 53 Labs (5/17): LDL 57, HDL 71 Labs (7/17): K 4.2, creatinine 1.12 Labs (8/18): K 4.3, creatinine 1.07, LDL 58 Labs (1/20): LDL 66 Labs (6/20): K 3.6, creatinine 1.21, hgb 7.3 Labs (7/20): K 4.4, creatinine 1.3, BNP 165, hgb 7.5 => 8.2.  Labs (8/20): K 4, creatinine 1.4  ECG (personally reviewed): NSR, nonspecific ST changes.   PMH: 1. CAD: Acute MI in 2006 with ventricular fibrillation arrest.  Patient had 1st generation DES to LCx.  Patient then had CABG 2009 with LIMA-OM and SVG-PDA.  Plan has been to continue DAPT long-term because of 1st generation DES.   - Cardiolite (7/20): EF 61%, small fixed basal anterior defect, no ischemia. 2. Nephrolithiasis 3. HTN 4. Hyperlipidemia 5. Diverticulosis 6. Aortic stenosis: Mild on 7/20 echo. 7. Mitral valve prolapse with severe MR: MV repair in 2009.  Echo (2/14) with EF 50-55%, inferior HK, mild AS with mean gradient 12 mmHg, s/p MV repair with mild MR and no significant MS (mean gradient 3 mmHg), mild RV dilation, PA systolic pressure 37 mmHg.  Echo (3/16) with EF 50-55%, mild focal basal septal hypertrophy, mild aortic stenosis mean gradient 14 mmHg, s/p MV repair with mild MR and no MS, PASP 38 mmHg.  - Echo (5/20): EF 50-55% with inferior hypokinesis, mild AS, mild-moderate AI, s/p MV repair with stable mitral valve. 8. Thyroid nodule: Biopsy with benign follicular nodule in 4098. 9. BPH  10. OSA: Uses CPAP.  11. Palpitations: 30 day monitor (8/20) showed short NSVT runs, no atrial fibrillation.   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   FH: HTN  ROS: All systems reviewed and negative except as per HPI.   Current Outpatient Medications  Medication Sig Dispense Refill  . aspirin EC 81 MG tablet Take 1 tablet (81 mg total) by mouth daily. 90 tablet 3  . clopidogrel (PLAVIX) 75 MG tablet TAKE 1 TABLET ONCE DAILY. 90 tablet 0  . ferrous sulfate (FERROUSUL) 325 (65 FE) MG tablet Take 1 tablet (325  mg total) by mouth 3 (three) times daily with meals.  3  . furosemide (LASIX) 20 MG tablet TAKE 1 TABLET BY MOUTH DAILY. 90 tablet 0  . furosemide (LASIX) 40 MG tablet Take 1 tablet (40mg ) alternating with a half tablet (20mg ) every other day 30 tablet 6  . lisinopril (ZESTRIL) 5 MG tablet Take 1 tablet (5 mg total) by mouth daily. 30 tablet 6  . Multiple Vitamin (MULTIVITAMIN) tablet Take 1 tablet by mouth daily.      . potassium chloride SA (K-DUR) 20 MEQ tablet Take 1 tablet (20 mEq total) by mouth daily. 30 tablet 6  . simvastatin (ZOCOR) 40 MG tablet TAKE 1 TABLET  ONCE DAILY. 90 tablet 3  . Tamsulosin HCl (FLOMAX) 0.4 MG CAPS Take 0.4 mg by mouth daily.      No current facility-administered medications for this encounter.    BP 139/60   Pulse 63   Wt 61.3 kg (135 lb 3.2 oz)   SpO2 98%   BMI 20.56 kg/m  General: NAD Neck: No JVD, no thyromegaly or thyroid nodule.  Lungs: Clear to auscultation bilaterally with normal respiratory effort. CV: Nondisplaced PMI.  Heart regular S1/S2, no S3/S4, 2/6 SEM RUSB with clear S2.  No peripheral edema.  No carotid bruit.  Normal pedal pulses.  Abdomen: Soft, nontender, no hepatosplenomegaly, no distention.  Skin: Intact without lesions or rashes.  Neurologic: Alert and oriented x 3.  Psych: Normal affect. Extremities: No clubbing or cyanosis.  HEENT: Normal.   Assessment/Plan: 1. CAD: S/p 1st generation DES to LCx and later CABG. Cardiolite was done in 7/20, this showed no ischemia.  Currently, no significant exertional symptoms.  - Patient has been on ASA and Plavix long-term and plan to continue this regimen.    - Continue statin.  2. S/p MV repair: MV stable on 5/20 echo.   3. Aortic stenosis: Mild on 5/20 echo. 4. Hyperlipidemia: Continue statin.  Check lipids today.   5. HTN: BP better on lower lisinopril (had been low).  6. Chronic diastolic CHF: Echo in 5/20 with EF 50-55%.  He is not volume overloaded on exam.  - Continue Lasix 40 daily alternating with 20 daily.  BMET today.   7. Atrial arrhythmias: PACs versus MAT seen in the past.   8. OSA: He feels markedly better after starting on CPAP.  9. NSVT: 30 day monitor in 8/20 showed short NSVT runs.  This was in setting of severe OSA, hopefully less frequent with OSA treatment.  - I will have him wear a Zio patch for 3 days.  If he still has significant ventricular ectopy, I will start a beta blocker.    Followup 6 months.   6/20 02/12/2019

## 2019-02-13 DIAGNOSIS — G4733 Obstructive sleep apnea (adult) (pediatric): Secondary | ICD-10-CM | POA: Diagnosis not present

## 2019-02-24 DIAGNOSIS — I472 Ventricular tachycardia: Secondary | ICD-10-CM | POA: Diagnosis not present

## 2019-02-25 DIAGNOSIS — I2510 Atherosclerotic heart disease of native coronary artery without angina pectoris: Secondary | ICD-10-CM | POA: Diagnosis not present

## 2019-02-25 DIAGNOSIS — I10 Essential (primary) hypertension: Secondary | ICD-10-CM | POA: Diagnosis not present

## 2019-03-04 ENCOUNTER — Telehealth (HOSPITAL_COMMUNITY): Payer: Self-pay

## 2019-03-04 MED ORDER — METOPROLOL SUCCINATE ER 25 MG PO TB24: 25.0000 mg | ORAL_TABLET | Freq: Every day | ORAL | 5 refills | Status: DC

## 2019-03-04 NOTE — Telephone Encounter (Signed)
Spoke with patient, aware of results and need to start Toprol XL 25mg  daily Verbalized understanding.

## 2019-03-04 NOTE — Telephone Encounter (Signed)
-----   Message from Laurey Morale, MD sent at 03/02/2019  7:13 PM EST ----- He had 3 short NSVT runs and frequent PVCs, 6.3% of total.  Would start Toprol XL 25 mg daily to try to suppress PVCs.

## 2019-03-16 DIAGNOSIS — G4733 Obstructive sleep apnea (adult) (pediatric): Secondary | ICD-10-CM | POA: Diagnosis not present

## 2019-03-24 ENCOUNTER — Ambulatory Visit: Payer: Medicare Other | Attending: Internal Medicine

## 2019-03-24 DIAGNOSIS — Z23 Encounter for immunization: Secondary | ICD-10-CM | POA: Insufficient documentation

## 2019-03-24 NOTE — Progress Notes (Signed)
   Covid-19 Vaccination Clinic  Name:  BAER HINTON    MRN: [id number] DOB: Jul 25, 1928  03/24/2019  Mr. Torian was observed post Covid-19 immunization for 15 minutes without incidence. He was provided with Vaccine Information Sheet and instruction to access the V-Safe system.   Mr. Consuegra was instructed to call 911 with any severe reactions post vaccine: Marland Kitchen Difficulty breathing  . Swelling of your face and throat  . A fast heartbeat  . A bad rash all over your body  . Dizziness and weakness    Immunizations Administered    Name Date Dose VIS Date Route   Pfizer COVID-19 Vaccine 03/24/2019 10:33 AM 0.3 mL 02/07/2019 Intramuscular   Manufacturer: [employer], [employer]   Lot: [id number]   NDC: [address]-4

## 2019-04-14 ENCOUNTER — Ambulatory Visit: Payer: Medicare Other | Attending: Internal Medicine

## 2019-04-14 DIAGNOSIS — Z23 Encounter for immunization: Secondary | ICD-10-CM | POA: Insufficient documentation

## 2019-04-14 NOTE — Progress Notes (Signed)
   Covid-19 Vaccination Clinic  Name:  [name] [name]    MRN: [id number] DOB: 1928-11-03  04/14/2019  [name] was observed post Covid-19 immunization for 15 minutes without incidence. He was provided with Vaccine Information Sheet and instruction to access the V-Safe system.   [name] was instructed to call 911 with any severe reactions post vaccine: Marland Kitchen Difficulty breathing  . Swelling of your face and throat  . A fast heartbeat  . A bad rash all over your body  . Dizziness and weakness    Immunizations Administered    Name Date Dose VIS Date Route   Pfizer COVID-19 Vaccine 04/14/2019 10:52 AM 0.3 mL 02/07/2019 Intramuscular   Manufacturer: [employer], [employer]   Lot: [id number]   NDC: [address]-0

## 2019-04-16 DIAGNOSIS — R338 Other retention of urine: Secondary | ICD-10-CM | POA: Diagnosis not present

## 2019-04-16 DIAGNOSIS — N401 Enlarged prostate with lower urinary tract symptoms: Secondary | ICD-10-CM | POA: Diagnosis not present

## 2019-04-24 DIAGNOSIS — I5032 Chronic diastolic (congestive) heart failure: Secondary | ICD-10-CM | POA: Diagnosis not present

## 2019-04-24 DIAGNOSIS — D6489 Other specified anemias: Secondary | ICD-10-CM | POA: Diagnosis not present

## 2019-04-24 DIAGNOSIS — E7849 Other hyperlipidemia: Secondary | ICD-10-CM | POA: Diagnosis not present

## 2019-04-24 DIAGNOSIS — I130 Hypertensive heart and chronic kidney disease with heart failure and stage 1 through stage 4 chronic kidney disease, or unspecified chronic kidney disease: Secondary | ICD-10-CM | POA: Diagnosis not present

## 2019-04-30 ENCOUNTER — Other Ambulatory Visit (HOSPITAL_COMMUNITY): Payer: Self-pay | Admitting: Cardiology

## 2019-04-30 DIAGNOSIS — H52203 Unspecified astigmatism, bilateral: Secondary | ICD-10-CM | POA: Diagnosis not present

## 2019-04-30 DIAGNOSIS — Z961 Presence of intraocular lens: Secondary | ICD-10-CM | POA: Diagnosis not present

## 2019-05-20 ENCOUNTER — Telehealth (HOSPITAL_COMMUNITY): Payer: Self-pay

## 2019-05-20 DIAGNOSIS — L720 Epidermal cyst: Secondary | ICD-10-CM | POA: Diagnosis not present

## 2019-05-20 DIAGNOSIS — Z85828 Personal history of other malignant neoplasm of skin: Secondary | ICD-10-CM | POA: Diagnosis not present

## 2019-05-20 DIAGNOSIS — L298 Other pruritus: Secondary | ICD-10-CM | POA: Diagnosis not present

## 2019-05-20 NOTE — Telephone Encounter (Signed)
Received request for medical records from [employer], requesting records from visit on 02/11/19 and 2 months prior. No records in 2 months prior to 02/11/19 office visit. Long term monitor order and office note from 12/15 faxed to [phone]

## 2019-06-25 ENCOUNTER — Other Ambulatory Visit (HOSPITAL_COMMUNITY): Payer: Self-pay | Admitting: Cardiology

## 2019-06-26 ENCOUNTER — Other Ambulatory Visit (HOSPITAL_COMMUNITY): Payer: Self-pay | Admitting: Cardiology

## 2019-07-03 DIAGNOSIS — Z4789 Encounter for other orthopedic aftercare: Secondary | ICD-10-CM | POA: Diagnosis not present

## 2019-07-10 DIAGNOSIS — S8002XD Contusion of left knee, subsequent encounter: Secondary | ICD-10-CM | POA: Diagnosis not present

## 2019-07-24 DIAGNOSIS — M25551 Pain in right hip: Secondary | ICD-10-CM | POA: Diagnosis not present

## 2019-07-24 DIAGNOSIS — D692 Other nonthrombocytopenic purpura: Secondary | ICD-10-CM | POA: Diagnosis not present

## 2019-08-29 ENCOUNTER — Other Ambulatory Visit (HOSPITAL_COMMUNITY): Payer: Self-pay | Admitting: Cardiology

## 2019-09-04 DIAGNOSIS — H6123 Impacted cerumen, bilateral: Secondary | ICD-10-CM | POA: Diagnosis not present

## 2019-09-26 ENCOUNTER — Other Ambulatory Visit (HOSPITAL_COMMUNITY): Payer: Self-pay | Admitting: Cardiology

## 2019-10-20 DIAGNOSIS — D649 Anemia, unspecified: Secondary | ICD-10-CM | POA: Diagnosis not present

## 2019-10-24 ENCOUNTER — Other Ambulatory Visit (HOSPITAL_COMMUNITY): Payer: Self-pay | Admitting: Cardiology

## 2019-11-02 IMAGING — CT CT HEAD WITHOUT CONTRAST
3 series · 16 of 47 positions shown, 19 images · non-contrast
Comparison: None.

CLINICAL DATA: Encephalopathy.

EXAM:
CT HEAD WITHOUT CONTRAST
TECHNIQUE: Contiguous axial images were obtained from the base of the skull
through the vertex without intravenous contrast.

[Series 2: head wo · axial · 0.47mm/px · z∈[-154,-24]mm · 10 of 32 slices shown, 13 images]
[im 3/32  brain]
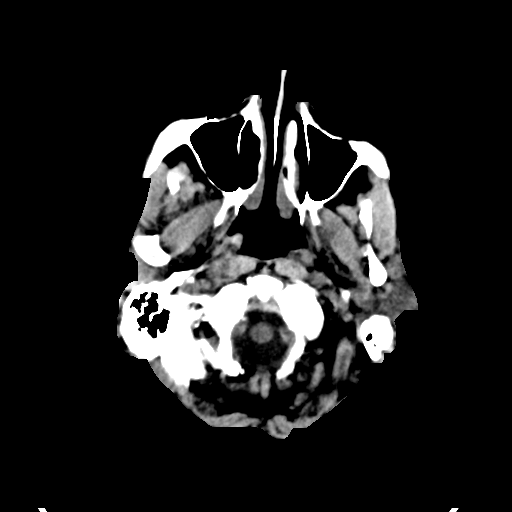
[im 3/32  bone]
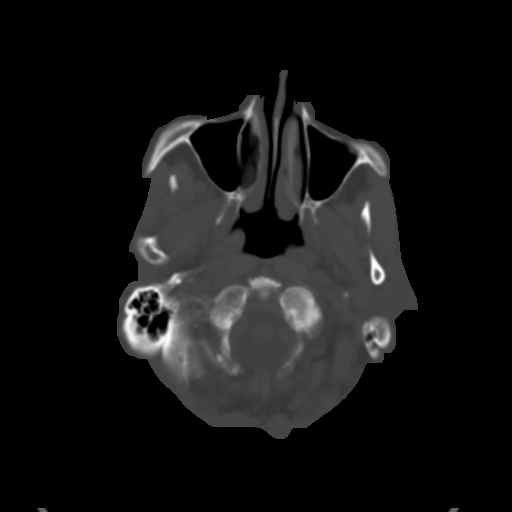
[im 6/32  brain]
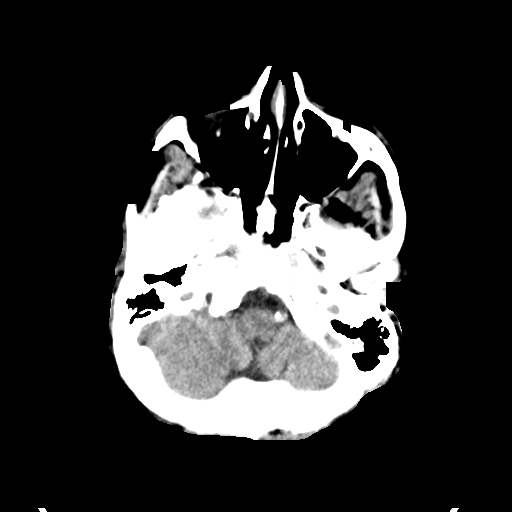
[im 9/32  brain]
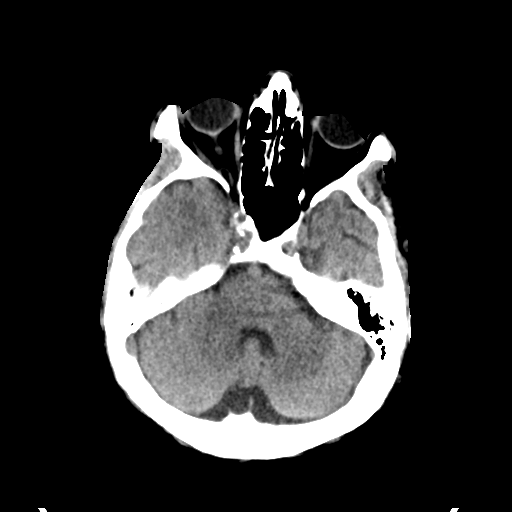
[im 11/32  brain]
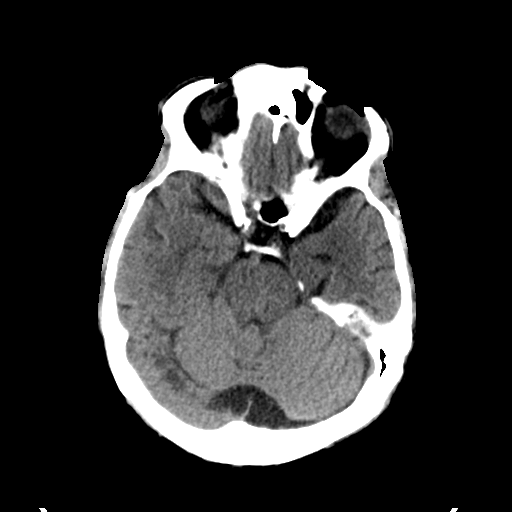
[im 14/32  brain]
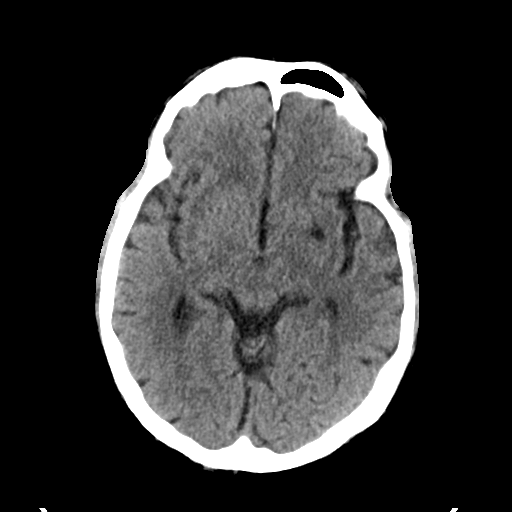
[im 14/32  bone]
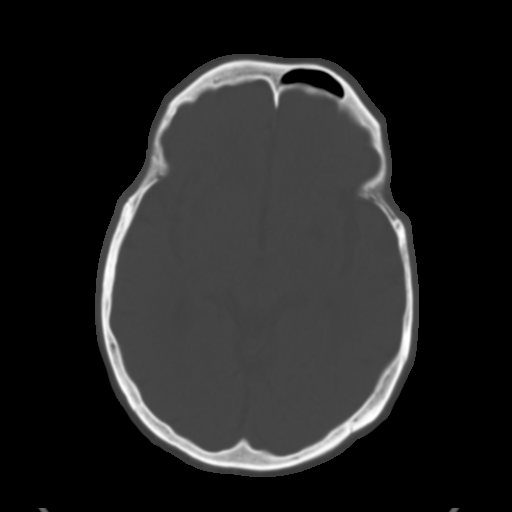
[im 18/32  brain]
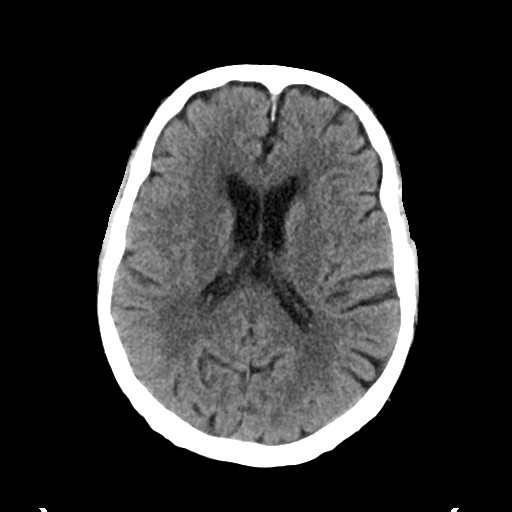
[im 21/32  brain]
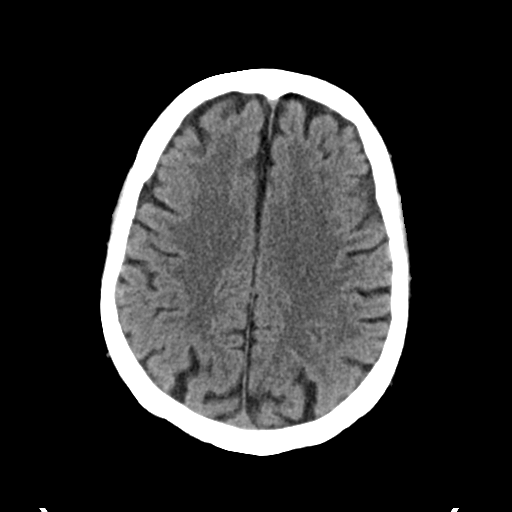
[im 24/32  brain]
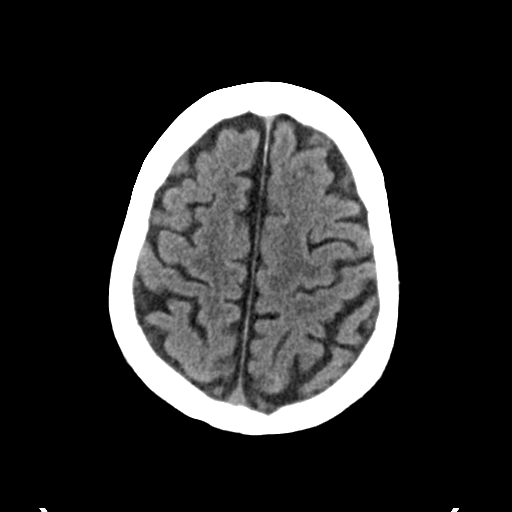
[im 26/32  brain]
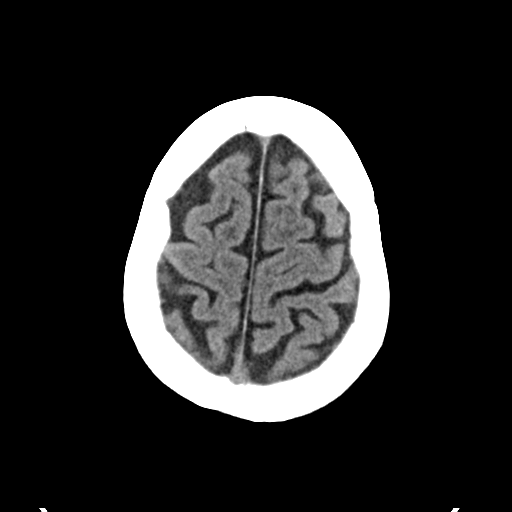
[im 26/32  bone]
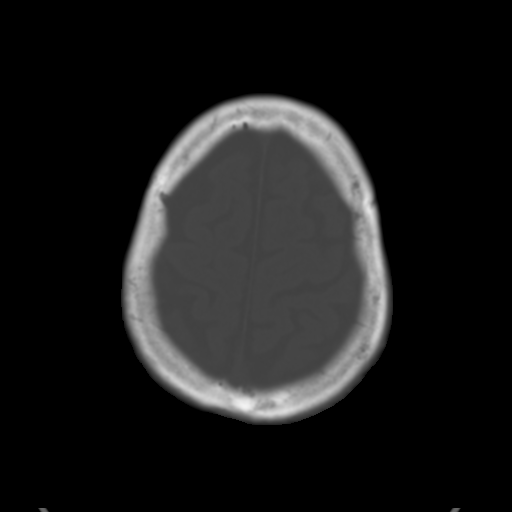
[im 29/32  brain]
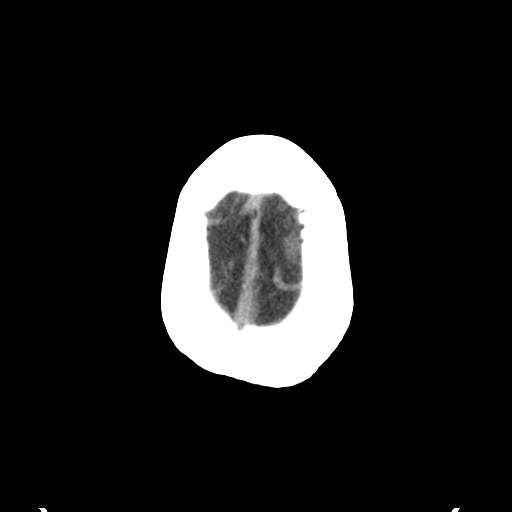

[Series 4: coronal soft tissue · coronal · 0.39mm/px · 3 of 65 slices shown]
[im 22/65  brain]
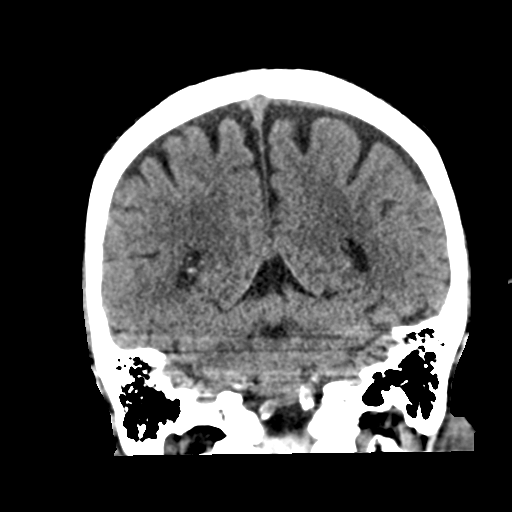
[im 29/65  brain]
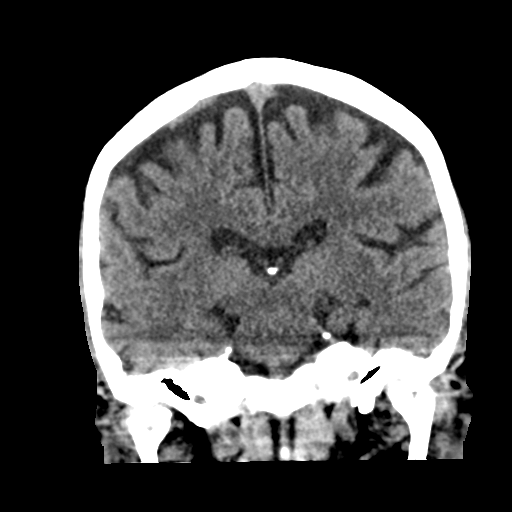
[im 36/65  brain]
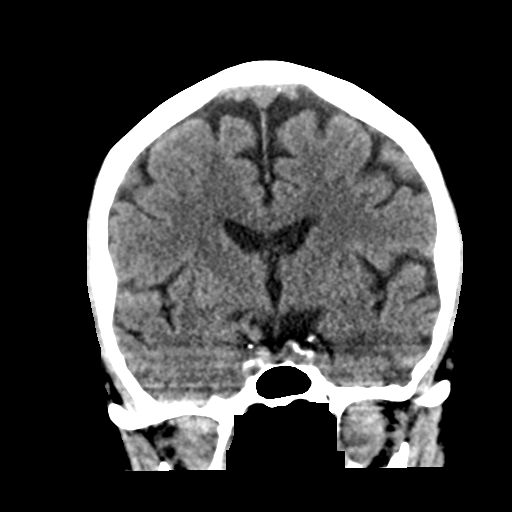

[Series 5: sagittal soft tissue · sagittal · 0.36mm/px · 3 of 52 slices shown]
[im 18/52  brain]
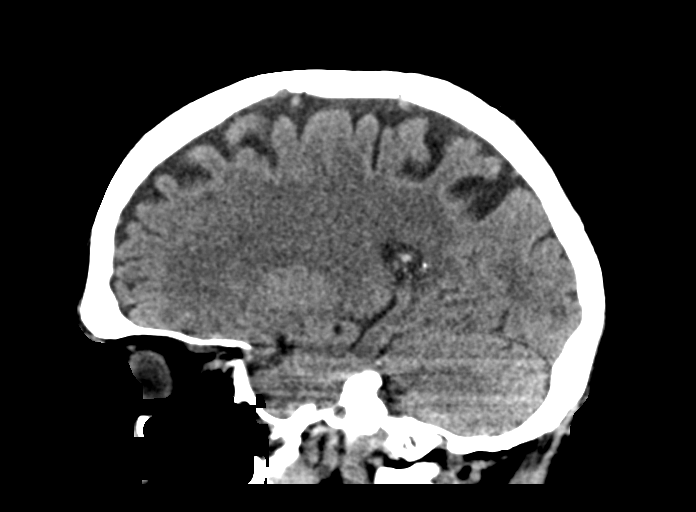
[im 26/52  brain]
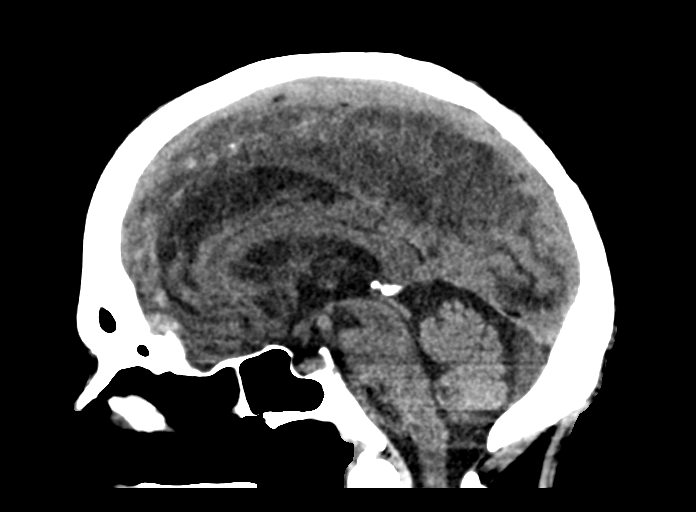
[im 35/52  brain]
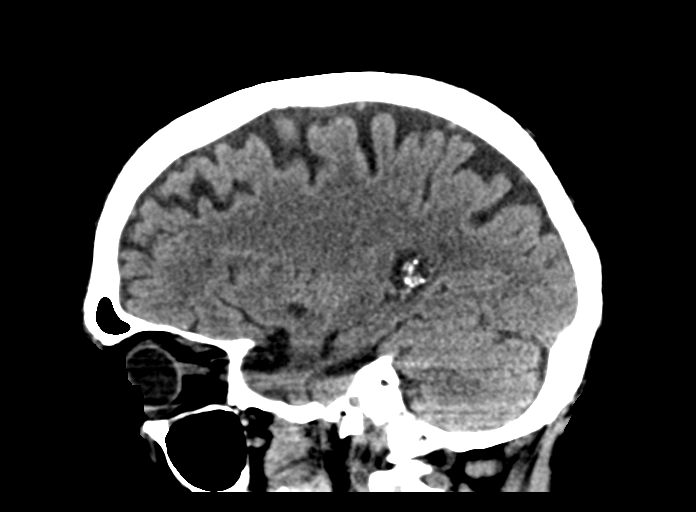

[16 of 47 positions shown; findings below may reference images not displayed]

FINDINGS: Brain: Cerebral volume and ventricle size normal for age. Small
benign cyst left basal ganglia. Mild white matter changes appear
chronic. No acute cortical infarct, hemorrhage, or mass.

Vascular: Atherosclerotic calcification. Negative for hyperdense
vessel

Skull: Negative

Sinuses/Orbits: Negative

Other: None
IMPRESSION: No acute abnormality. Mild chronic appearing white matter changes,
typical for age.

## 2019-11-24 ENCOUNTER — Other Ambulatory Visit (HOSPITAL_COMMUNITY): Payer: Self-pay | Admitting: Cardiology

## 2019-12-22 ENCOUNTER — Other Ambulatory Visit (HOSPITAL_COMMUNITY): Payer: Self-pay | Admitting: Cardiology

## 2019-12-27 ENCOUNTER — Ambulatory Visit: Payer: Medicare Other | Attending: Internal Medicine

## 2019-12-27 DIAGNOSIS — Z23 Encounter for immunization: Secondary | ICD-10-CM

## 2019-12-27 NOTE — Progress Notes (Signed)
   Covid-19 Vaccination Clinic  Name:  SAMUELL KNOBLE    MRN: [id number] DOB: 09-24-1928  12/27/2019  Mr. Bachtel was observed post Covid-19 immunization for 15 minutes without incident. He was provided with Vaccine Information Sheet and instruction to access the V-Safe system.   Mr. Stroupe was instructed to call 911 with any severe reactions post vaccine: Marland Kitchen Difficulty breathing  . Swelling of face and throat  . A fast heartbeat  . A bad rash all over body  . Dizziness and weakness

## 2020-01-09 DIAGNOSIS — G4733 Obstructive sleep apnea (adult) (pediatric): Secondary | ICD-10-CM | POA: Diagnosis not present

## 2020-01-14 DIAGNOSIS — M25551 Pain in right hip: Secondary | ICD-10-CM | POA: Diagnosis not present

## 2020-01-15 ENCOUNTER — Telehealth (INDEPENDENT_AMBULATORY_CARE_PROVIDER_SITE_OTHER): Payer: Medicare Other | Admitting: Cardiology

## 2020-01-15 ENCOUNTER — Encounter: Payer: Self-pay | Admitting: Cardiology

## 2020-01-15 ENCOUNTER — Other Ambulatory Visit: Payer: Self-pay

## 2020-01-15 ENCOUNTER — Telehealth: Payer: Self-pay | Admitting: *Deleted

## 2020-01-15 VITALS — Ht 68.0 in | Wt 135.0 lb

## 2020-01-15 DIAGNOSIS — G4733 Obstructive sleep apnea (adult) (pediatric): Secondary | ICD-10-CM

## 2020-01-15 DIAGNOSIS — I10 Essential (primary) hypertension: Secondary | ICD-10-CM

## 2020-01-15 NOTE — Telephone Encounter (Signed)
Reminder made for 02/12/20 4 week download.

## 2020-01-15 NOTE — Telephone Encounter (Signed)
-----   Message from Theresia Majors, RN sent at 01/15/2020 10:30 AM EST ----- Dr. Mayford Knife had a virtual visit with this patient today and would like for you to get a download on him in 4 weeks.  Thanks!

## 2020-01-15 NOTE — Progress Notes (Signed)
Virtual Visit via Telephone Note   This visit type was conducted due to national recommendations for restrictions regarding the COVID-19 Pandemic (e.g. social distancing) in an effort to limit this patient's exposure and mitigate transmission in our community.  Due to his co-morbid illnesses, this patient is at least at moderate risk for complications without adequate follow up.  This format is felt to be most appropriate for this patient at this time.  All issues noted in this document were discussed and addressed.  A limited physical exam was performed with this format.  Please refer to the patient's chart for his consent to telehealth for [facility].   Evaluation Performed:  Follow-up visit  This visit type was conducted due to national recommendations for restrictions regarding the COVID-19 Pandemic (e.g. social distancing).  This format is felt to be most appropriate for this patient at this time.  All issues noted in this document were discussed and addressed.  No physical exam was performed (except for noted visual exam findings with Video Visits).  Please refer to the patient's chart (MyChart message for video visits and phone note for telephone visits) for the patient's consent to telehealth for [facility].  Date:  01/15/2020   ID:  [name] [name], DOB [birth year], MRN [id number]  Patient Location:  Home  Provider location:   [place]  PCP:  Nila Nephew, MD  Cardiologist:Dalton Shirlee Latch, MD Sleep Medicine:  Armanda Magic, MD Electrophysiologist:  None   Chief Complaint:  OSA  History of Present Illness:    [name] [name] is a 86 y.o. male who presents via audio/video conferencing for a telehealth visit today.    This is an 86yo male with a hx of CAD, HTN, s/p MV repair and chronic diastolic CHF.  He was having excessive daytime sleepiness and was referred for sleep study.  He had mild to moderate OSA with an AHI of 14.2/hr on home sleep study.  He is now on auto CPAP.     He is doing well with his CPAP device and thinks that he has gotten used to it.  He tolerates the mask and feels the pressure is adequate.  Since going on CPAP he feels rested in the am and has no significant daytime sleepiness.  He denies any significant mouth or nasal dryness or nasal congestion.  He does not think that he snores.    The patient does not have symptoms concerning for COVID-19 infection (fever, chills, cough, or new shortness of breath).   Prior CV studies:   The following studies were reviewed today:  PAP compliance download  Past Medical History:  Diagnosis Date  . Acute MI (HCC)   . Coronary artery disease   . Diverticulosis 06-12-2003   Colonoscopy  . History of kidney stones   . Hyperlipidemia   . Hypertension   . MVP (mitral valve prolapse)   . Undiagnosed cardiac murmurs   . Urinary retention    Past Surgical History:  Procedure Laterality Date  . CARDIAC CATHETERIZATION    . CORONARY ANGIOPLASTY    . MITRAL VALVE REPAIR     2/09  . TOTAL KNEE ARTHROPLASTY Left 08/16/2018   Procedure: TOTAL KNEE ARTHROPLASTY;  Surgeon: Eugenia Mcalpine, MD;  Location: [facility];  Service: Orthopedics;  Laterality: Left;  with adductor canal     Current Meds  Medication Sig  . aspirin EC 81 MG tablet Take 1 tablet (81 mg total) by mouth daily.  . clopidogrel (PLAVIX) 75 MG tablet  Take 1 tablet (75 mg total) by mouth daily.  . ferrous sulfate (FERROUSUL) 325 (65 FE) MG tablet Take 1 tablet (325 mg total) by mouth 3 (three) times daily with meals.  . furosemide (LASIX) 20 MG tablet TAKE ONE TABLET EVERY OTHER DAY.  . furosemide (LASIX) 40 MG tablet TAKE 1 TABLET DAILY ALTERNATING WITH 1/2 TABLET EVERY OTHER DAY.  Marland Kitchen lisinopril (ZESTRIL) 5 MG tablet Take 1 tablet (5 mg total) by mouth daily.  . metoprolol succinate (TOPROL-XL) 25 MG 24 hr tablet Take 1 tablet (25 mg total) by mouth daily. Needs appt  . Multiple Vitamin (MULTIVITAMIN) tablet Take 1 tablet by mouth daily.    .  potassium chloride SA (KLOR-CON) 20 MEQ tablet Take 1 tablet (20 mEq total) by mouth daily.  . simvastatin (ZOCOR) 40 MG tablet TAKE 1 TABLET ONCE DAILY.  . Tamsulosin HCl (FLOMAX) 0.4 MG CAPS Take 0.4 mg by mouth daily.      Allergies:   Patient has no known allergies.   Social History   Tobacco Use  . Smoking status: Never Smoker  . Smokeless tobacco: Never Used  Vaping Use  . Vaping Use: Never used  Substance Use Topics  . Alcohol use: Yes    Comment: rare  . Drug use: No     Family Hx: The patient's family history includes Hypertension in an other family member. There is no history of Colon cancer.  ROS:   Please see the history of present illness.     All other systems reviewed and are negative.   Labs/Other Tests and Data Reviewed:    Recent Labs: 02/11/2019: BUN 31; Creatinine, Ser 1.34; Potassium 4.1; Sodium 139   Recent Lipid Panel Lab Results  Component Value Date/Time   CHOL 151 02/11/2019 11:26 AM   TRIG 74 02/11/2019 11:26 AM   HDL 61 02/11/2019 11:26 AM   CHOLHDL 2.5 02/11/2019 11:26 AM   LDLCALC 75 02/11/2019 11:26 AM    Wt Readings from Last 3 Encounters:  01/15/20 135 lb (61.2 kg)  02/11/19 135 lb 3.2 oz (61.3 kg)  01/06/19 140 lb (63.5 kg)     Objective:    Vital Signs:  Ht 5\' 8"  (1.727 m)   Wt 135 lb (61.2 kg)   BMI 20.53 kg/m    ASSESSMENT & PLAN:    1.  OSA -   The patient is tolerating PAP therapy well without any problems. The PAP download was reviewed today and showed an AHI of 17.4/hr on auto PAP cm H2O with 87% compliance in using more than 4 hours nightly.  The patient has been using and benefiting from PAP use and will continue to benefit from therapy.  -His AHI is too high and it appears that his mask is leaking quite a bit -he just got a new mask and has not started using it yet  -I will get a download in 4 weeks  2.  HTN -continue Lisinopril 5mg  daily and Toprol XL 25mg  daily  COVID-19 Education: The signs and symptoms  of COVID-19 were discussed with the patient and how to seek care for testing (follow up with PCP or arrange E-visit).  The importance of social distancing was discussed today.  Patient Risk:   After full review of this patient's clinical status, I feel that they are at least moderate risk at this time.  Time:   Today, I have spent 20 minutes  on teledmedicine discussing medical problems including OSA, HTN, CHF and reviewing patient's  chart including PAP compliance downlaod.  Medication Adjustments/Labs and Tests Ordered: Current medicines are reviewed at length with the patient today.  Concerns regarding medicines are outlined above.  Tests Ordered: No orders of the defined types were placed in this encounter.  Medication Changes: No orders of the defined types were placed in this encounter.   Disposition:  Follow up in 1 year(s)  Signed, Armanda Magic, MD  01/15/2020 10:18 AM    [facility]

## 2020-01-15 NOTE — Patient Instructions (Signed)

## 2020-01-21 ENCOUNTER — Other Ambulatory Visit (HOSPITAL_COMMUNITY): Payer: Self-pay | Admitting: Cardiology

## 2020-01-28 DIAGNOSIS — M79604 Pain in right leg: Secondary | ICD-10-CM | POA: Diagnosis not present

## 2020-01-28 DIAGNOSIS — M5416 Radiculopathy, lumbar region: Secondary | ICD-10-CM | POA: Diagnosis not present

## 2020-01-30 ENCOUNTER — Ambulatory Visit (HOSPITAL_COMMUNITY)
Admission: RE | Admit: 2020-01-30 | Discharge: 2020-01-30 | Disposition: A | Discharge status: Home / Self Care | Payer: Medicare Other | Source: Ambulatory Visit | Attending: Cardiology | Admitting: Cardiology

## 2020-01-30 ENCOUNTER — Other Ambulatory Visit: Payer: Self-pay

## 2020-01-30 ENCOUNTER — Encounter (HOSPITAL_COMMUNITY): Payer: Self-pay | Admitting: Cardiology

## 2020-01-30 VITALS — BP 116/54 | HR 54 | Ht 68.0 in | Wt 139.4 lb

## 2020-01-30 DIAGNOSIS — Z7902 Long term (current) use of antithrombotics/antiplatelets: Secondary | ICD-10-CM | POA: Insufficient documentation

## 2020-01-30 DIAGNOSIS — Z79899 Other long term (current) drug therapy: Secondary | ICD-10-CM | POA: Insufficient documentation

## 2020-01-30 DIAGNOSIS — M545 Low back pain, unspecified: Secondary | ICD-10-CM | POA: Diagnosis not present

## 2020-01-30 DIAGNOSIS — E785 Hyperlipidemia, unspecified: Secondary | ICD-10-CM | POA: Diagnosis not present

## 2020-01-30 DIAGNOSIS — Z7982 Long term (current) use of aspirin: Secondary | ICD-10-CM | POA: Diagnosis not present

## 2020-01-30 DIAGNOSIS — I2510 Atherosclerotic heart disease of native coronary artery without angina pectoris: Secondary | ICD-10-CM | POA: Diagnosis not present

## 2020-01-30 DIAGNOSIS — I4510 Unspecified right bundle-branch block: Secondary | ICD-10-CM | POA: Insufficient documentation

## 2020-01-30 DIAGNOSIS — I252 Old myocardial infarction: Secondary | ICD-10-CM | POA: Diagnosis not present

## 2020-01-30 DIAGNOSIS — I350 Nonrheumatic aortic (valve) stenosis: Secondary | ICD-10-CM | POA: Insufficient documentation

## 2020-01-30 DIAGNOSIS — I110 Hypertensive heart disease with heart failure: Secondary | ICD-10-CM | POA: Diagnosis not present

## 2020-01-30 DIAGNOSIS — Z951 Presence of aortocoronary bypass graft: Secondary | ICD-10-CM | POA: Insufficient documentation

## 2020-01-30 DIAGNOSIS — G4733 Obstructive sleep apnea (adult) (pediatric): Secondary | ICD-10-CM | POA: Insufficient documentation

## 2020-01-30 DIAGNOSIS — I080 Rheumatic disorders of both mitral and aortic valves: Secondary | ICD-10-CM | POA: Diagnosis not present

## 2020-01-30 LAB — CBC
HCT: 33.5 % — ABNORMAL LOW (ref 39.0–52.0)
Hemoglobin: 10.6 g/dL — ABNORMAL LOW (ref 13.0–17.0)
MCH: 32 pg (ref 26.0–34.0)
MCHC: 31.6 g/dL (ref 30.0–36.0)
MCV: 101.2 fL — ABNORMAL HIGH (ref 80.0–100.0)
Platelets: 191 10*3/uL (ref 150–400)
RBC: 3.31 MIL/uL — ABNORMAL LOW (ref 4.22–5.81)
RDW: 13.6 % (ref 11.5–15.5)
WBC: 6.5 10*3/uL (ref 4.0–10.5)
nRBC: 0 % (ref 0.0–0.2)

## 2020-01-30 LAB — BASIC METABOLIC PANEL
Anion gap: 8 (ref 5–15)
BUN: 34 mg/dL — ABNORMAL HIGH (ref 8–23)
CO2: 21 mmol/L — ABNORMAL LOW (ref 22–32)
Calcium: 9.4 mg/dL (ref 8.9–10.3)
Chloride: 109 mmol/L (ref 98–111)
Creatinine, Ser: 1.5 mg/dL — ABNORMAL HIGH (ref 0.61–1.24)
GFR, Estimated: 44 mL/min — ABNORMAL LOW (ref >60)
Glucose, Bld: 96 mg/dL (ref 70–99)
Potassium: 5 mmol/L (ref 3.5–5.1)
Sodium: 138 mmol/L (ref 135–145)

## 2020-01-30 LAB — LIPID PANEL
Cholesterol: 140 mg/dL (ref 0–200)
HDL: 53 mg/dL (ref >40)
LDL Cholesterol: 77 mg/dL (ref 0–99)
Total CHOL/HDL Ratio: 2.6 RATIO
Triglycerides: 51 mg/dL (ref <150)
VLDL: 10 mg/dL (ref 0–40)

## 2020-01-30 NOTE — Patient Instructions (Signed)
Labs done today. We will contact you only if your labs are abnormal.  No medication changes were made. Please continue all current medications as prescribed.   Your physician recommends that you schedule a follow-up appointment EU:MPNTI 2 weeks for an echo and in 6 months for an appointment with Dr. Shirlee Latch. Please contact our office in May 2022 to schedule a June 2022 appointment.  If you have any questions or concerns before your next appointment please send [place] a message through [place] or call our office at [phone].    TO LEAVE A MESSAGE FOR THE NURSE SELECT OPTION 2, PLEASE LEAVE A MESSAGE INCLUDING: . YOUR NAME . DATE OF BIRTH . CALL BACK NUMBER . REASON FOR CALL**this is important as we prioritize the call backs  YOU WILL RECEIVE A CALL BACK THE SAME DAY AS LONG AS YOU CALL BEFORE 4:00 PM  Your physician has requested that you have an echocardiogram. Echocardiography is a painless test that uses sound waves to create images of your heart. It provides your doctor with information about the size and shape of your heart and how well your heart's chambers and valves are working. This procedure takes approximately one hour. There are no restrictions for this procedure.   Do the following things EVERYDAY: 1) Weigh yourself in the morning before breakfast. Write it down and keep it in a log. 2) Take your medicines as prescribed 3) Eat low salt foods--Limit salt (sodium) to 2000 mg per day.  4) Stay as active as you can everyday 5) Limit all fluids for the day to less than 2 liters   At the [facility], you and your health needs are our priority. As part of our continuing mission to provide you with exceptional heart care, we have created designated Provider Care Teams. These Care Teams include your primary Cardiologist (physician) and Advanced Practice Providers (APPs- Physician Assistants and Nurse Practitioners) who all work together to provide you with the care you  need, when you need it.   You may see any of the following providers on your designated Care Team at your next follow up: Marland Kitchen Dr Arvilla Meres . Dr Marca Ancona . Tonye Becket, NP . Robbie Lis, PA . Karle Plumber, PharmD   Please be sure to bring in all your medications bottles to every appointment.

## 2020-01-31 NOTE — Progress Notes (Signed)
Patient ID: [name] [name], male   DOB: 16-Dec-1928, 86 y.o.   MRN: [id number] PCP: Dr. Chilton Si Cardiology: Dr. Shirlee Latch  86 y.o.with history of CAD s/p CABG and mitral regurgitation s/p mitral valve repair presents for followup of CAD.   Patient had most recent echo in 5/20, showing EF 50-55% with inferior hypokinesis, mild AS, mild-moderate AI, s/p MV repair with stable mitral valve.    In 6/20, he had left TKR.  Cardiology was consulted while he was in the hospital for ?atrial fibrillation, but this was thought to actually be frequent PACs versus MAT.    At a prior appointment, he reported significant fatigue and dyspnea post-op. CTA chest showed no PE or other active disease.  Cardiolite was done in 7/20 showing EF 61%, small fixed basal anterior defect, no ischemia.  I had him increase Lasix to 40 mg daily due to concern for possible diastolic CHF.  He wore a 30 day monitor which showed short NSVT runs, no atrial fibrillation.  Sleep study showed severe OSA and he was started on CPAP.  Zio patch again in 12/20 showed 6.3% PVCs, Toprol XL was started.   Main complaint is orthopedic.  He fell recently while mulching, twisted his right leg and has knee pain. He walks for exercise.  No chest pain, no exertional dyspnea.  No palpitations or lightheadedness.    ECG (personally reviewed): NSR, iRBBB  Labs (2/14): K 4, creatinine 1.1 Labs (1/15): K 4, creatinine 1.1, LDL 64, HDL 57, pro-BNP 112 Labs (6/60): K 4.1, creatinine 1.03, LDL 66, HDL 53 Labs (5/17): LDL 57, HDL 71 Labs (7/17): K 4.2, creatinine 1.12 Labs (8/18): K 4.3, creatinine 1.07, LDL 58 Labs (1/20): LDL 66 Labs (6/20): K 3.6, creatinine 1.21, hgb 7.3 Labs (7/20): K 4.4, creatinine 1.3, BNP 165, hgb 7.5 => 8.2.  Labs (8/20): K 4, creatinine 1.4 Labs (12/20): LDL 75, creatinine 1.34  PMH: 1. CAD: Acute MI in 2006 with ventricular fibrillation arrest.  Patient had 1st generation DES to LCx.  Patient then had CABG 2009 with LIMA-OM and  SVG-PDA.  Plan has been to continue DAPT long-term because of 1st generation DES.  - Cardiolite (7/20): EF 61%, small fixed basal anterior defect, no ischemia. 2. Nephrolithiasis 3. HTN 4. Hyperlipidemia 5. Diverticulosis 6. Aortic stenosis: Mild on 7/20 echo. 7. Mitral valve prolapse with severe MR: MV repair in 2009.  Echo (2/14) with EF 50-55%, inferior HK, mild AS with mean gradient 12 mmHg, s/p MV repair with mild MR and no significant MS (mean gradient 3 mmHg), mild RV dilation, PA systolic pressure 37 mmHg.  Echo (3/16) with EF 50-55%, mild focal basal septal hypertrophy, mild aortic stenosis mean gradient 14 mmHg, s/p MV repair with mild MR and no MS, PASP 38 mmHg.  - Echo (5/20): EF 50-55% with inferior hypokinesis, mild AS, mild-moderate AI, s/p MV repair with stable mitral valve. 8. Thyroid nodule: Biopsy with benign follicular nodule in 2018. 9. BPH  10. OSA: Uses CPAP.  11. Palpitations: 30 day monitor (8/20) showed short NSVT runs, no atrial fibrillation.  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   FH: HTN  ROS: All systems reviewed and negative except as per HPI.   Current Outpatient Medications  Medication Sig Dispense Refill  . aspirin EC 81 MG tablet Take 1 tablet (81 mg total) by mouth daily. 90 tablet 3  . clopidogrel (PLAVIX) 75 MG tablet Take 1  tablet (75 mg total) by mouth daily. 90 tablet 3  . ferrous sulfate (FERROUSUL) 325 (65 FE) MG tablet Take 1 tablet (325 mg total) by mouth 3 (three) times daily with meals.  3  . furosemide (LASIX) 20 MG tablet TAKE ONE TABLET EVERY OTHER DAY. 45 tablet 3  . furosemide (LASIX) 40 MG tablet TAKE 1 TABLET DAILY ALTERNATING WITH 1/2 TABLET EVERY OTHER DAY. 45 tablet 3  . lisinopril (ZESTRIL) 5 MG tablet Take 1 tablet (5 mg total) by mouth daily. 90 tablet 3  . metoprolol succinate (TOPROL-XL) 25 MG 24 hr tablet Take 1 tablet (25 mg total) by mouth daily. Needs  appt 30 tablet 5  . Multiple Vitamin (MULTIVITAMIN) tablet Take 1 tablet by mouth daily.      . potassium chloride SA (KLOR-CON) 20 MEQ tablet Take 1 tablet (20 mEq total) by mouth daily. 90 tablet 3  . simvastatin (ZOCOR) 40 MG tablet TAKE 1 TABLET ONCE DAILY. 30 tablet 11  . Tamsulosin HCl (FLOMAX) 0.4 MG CAPS Take 0.4 mg by mouth daily.      No current facility-administered medications for this encounter.    BP (!) 116/54   Pulse (!) 54   Ht 5\' 8"  (1.727 m)   Wt 63.2 kg (139 lb 6.4 oz)   SpO2 99%   BMI 21.20 kg/m  General: NAD Neck: No JVD, no thyromegaly or thyroid nodule.  Lungs: Clear to auscultation bilaterally with normal respiratory effort. CV: Nondisplaced PMI.  Heart regular S1/S2, no S3/S4, 2/6 SEM RUSB.  No peripheral edema.  No carotid bruit.  Normal pedal pulses.  Abdomen: Soft, nontender, no hepatosplenomegaly, no distention.  Skin: Intact without lesions or rashes.  Neurologic: Alert and oriented x 3.  Psych: Normal affect. Extremities: No clubbing or cyanosis.  HEENT: Normal.   Assessment/Plan: 1. CAD: S/p 1st generation DES to LCx and later CABG. Cardiolite was done in 7/20, this showed no ischemia.  Currently, no significant exertional symptoms.  - Patient has been on ASA and Plavix long-term and plan to continue this regimen.  CBC today.  - Continue statin.  2. S/p MV repair: MV stable on 5/20 echo.  Will arrange for repeat echo.  3. Aortic stenosis: Mild on 5/20 echo.  Will arrange for repeat echo.  4. Hyperlipidemia: Continue statin.  Check lipids today.   5. HTN: BP controlled.  6. Chronic diastolic CHF: Echo in 5/20 with EF 50-55%.  He is not volume overloaded on exam.  - Continue Lasix 40 daily alternating with 20 daily.  BMET today.   7. PVCs: Now on a beta blocker with no palpitations.  8. OSA: He feels markedly better after starting on CPAP.   Followup 6 months.   6/20 01/31/2020

## 2020-02-05 DIAGNOSIS — M48061 Spinal stenosis, lumbar region without neurogenic claudication: Secondary | ICD-10-CM | POA: Diagnosis not present

## 2020-02-06 ENCOUNTER — Telehealth (HOSPITAL_COMMUNITY): Payer: Self-pay

## 2020-02-06 NOTE — Telephone Encounter (Signed)
Samara Snide, RN  02/06/2020 2:31 PM EST Back to Top     Left message to return call. Letter mailed to address on file   Chinita Pester, CMA  02/05/2020 9:48 AM EST      [facility]   Marisa Hua, RN  02/04/2020 10:25 AM EST      LM for patient to return call    Modesta Messing, [facility]  02/02/2020 5:45 PM EST      Attempted to reach pt by phone. No answer/left vm requesting return call.

## 2020-02-06 NOTE — Telephone Encounter (Signed)
-----   Message from Laurey Morale, MD sent at 01/30/2020  5:25 PM EST ----- Lipids ok.  Decrease Lasix to 20 mg daily.  Stop KCl. BMET 10 days.

## 2020-02-11 ENCOUNTER — Other Ambulatory Visit: Payer: Self-pay

## 2020-02-11 ENCOUNTER — Ambulatory Visit (HOSPITAL_COMMUNITY)
Admission: RE | Admit: 2020-02-11 | Discharge: 2020-02-11 | Disposition: A | Discharge status: Home / Self Care | Payer: Medicare Other | Source: Ambulatory Visit | Attending: Cardiology | Admitting: Cardiology

## 2020-02-11 DIAGNOSIS — I352 Nonrheumatic aortic (valve) stenosis with insufficiency: Secondary | ICD-10-CM | POA: Insufficient documentation

## 2020-02-11 DIAGNOSIS — E785 Hyperlipidemia, unspecified: Secondary | ICD-10-CM | POA: Diagnosis not present

## 2020-02-11 DIAGNOSIS — I119 Hypertensive heart disease without heart failure: Secondary | ICD-10-CM | POA: Diagnosis not present

## 2020-02-11 DIAGNOSIS — Z951 Presence of aortocoronary bypass graft: Secondary | ICD-10-CM | POA: Insufficient documentation

## 2020-02-11 DIAGNOSIS — I2510 Atherosclerotic heart disease of native coronary artery without angina pectoris: Secondary | ICD-10-CM | POA: Diagnosis not present

## 2020-02-11 DIAGNOSIS — I350 Nonrheumatic aortic (valve) stenosis: Secondary | ICD-10-CM

## 2020-02-11 LAB — ECHOCARDIOGRAM COMPLETE
AR max vel: 1.6 cm2
AV Area VTI: 1.72 cm2
AV Area mean vel: 1.58 cm2
AV Mean grad: 11.7 mmHg
AV Peak grad: 19.1 mmHg
Ao pk vel: 2.19 m/s
Area-P 1/2: 4.36 cm2
Calc EF: 52.1 %
P 1/2 time: 192 msec
P 1/2 time: 881 msec
S' Lateral: 3.1 cm
Single Plane A2C EF: 50.9 %
Single Plane A4C EF: 51.9 %

## 2020-02-11 NOTE — Progress Notes (Signed)
  Echocardiogram 2D Echocardiogram with 3D has been performed.  Leta Jungling M 02/11/2020, 9:44 AM

## 2020-02-17 ENCOUNTER — Telehealth (HOSPITAL_COMMUNITY): Payer: Self-pay

## 2020-02-17 NOTE — Telephone Encounter (Signed)
-----   Message from Laurey Morale, MD sent at 02/11/2020 11:37 AM EST ----- Mild AS, moderate AI.  There is a degree of stenosis of the repaired mitral valve.  EF 50-55%.

## 2020-02-17 NOTE — Telephone Encounter (Signed)
Samara Snide, RN  02/17/2020 2:32 PM EST Back to Top     Patient advised and verbalized understanding   Samara Snide, RN  02/13/2020 2:07 PM EST      Left message to return call

## 2020-02-23 ENCOUNTER — Other Ambulatory Visit (HOSPITAL_COMMUNITY): Payer: Self-pay | Admitting: Cardiology

## 2020-03-17 DIAGNOSIS — M5416 Radiculopathy, lumbar region: Secondary | ICD-10-CM | POA: Diagnosis not present

## 2020-03-26 ENCOUNTER — Other Ambulatory Visit (HOSPITAL_COMMUNITY): Payer: Self-pay | Admitting: Cardiology

## 2020-04-01 DIAGNOSIS — M48062 Spinal stenosis, lumbar region with neurogenic claudication: Secondary | ICD-10-CM | POA: Diagnosis not present

## 2020-04-01 DIAGNOSIS — M5416 Radiculopathy, lumbar region: Secondary | ICD-10-CM | POA: Diagnosis not present

## 2020-04-09 DIAGNOSIS — R351 Nocturia: Secondary | ICD-10-CM | POA: Diagnosis not present

## 2020-04-09 DIAGNOSIS — N401 Enlarged prostate with lower urinary tract symptoms: Secondary | ICD-10-CM | POA: Diagnosis not present

## 2020-04-22 ENCOUNTER — Other Ambulatory Visit (HOSPITAL_COMMUNITY): Payer: Self-pay | Admitting: Cardiology

## 2020-04-23 ENCOUNTER — Other Ambulatory Visit (HOSPITAL_COMMUNITY): Payer: Self-pay | Admitting: Cardiology

## 2020-04-28 DIAGNOSIS — M5416 Radiculopathy, lumbar region: Secondary | ICD-10-CM | POA: Diagnosis not present

## 2020-05-04 DIAGNOSIS — H52223 Regular astigmatism, bilateral: Secondary | ICD-10-CM | POA: Diagnosis not present

## 2020-05-07 DIAGNOSIS — Z85828 Personal history of other malignant neoplasm of skin: Secondary | ICD-10-CM | POA: Diagnosis not present

## 2020-05-07 DIAGNOSIS — L2089 Other atopic dermatitis: Secondary | ICD-10-CM | POA: Diagnosis not present

## 2020-05-14 DIAGNOSIS — S86111A Strain of other muscle(s) and tendon(s) of posterior muscle group at lower leg level, right leg, initial encounter: Secondary | ICD-10-CM | POA: Diagnosis not present

## 2020-05-23 ENCOUNTER — Other Ambulatory Visit (HOSPITAL_COMMUNITY): Payer: Self-pay | Admitting: Cardiology

## 2020-05-31 DIAGNOSIS — H6123 Impacted cerumen, bilateral: Secondary | ICD-10-CM | POA: Diagnosis not present

## 2020-07-20 ENCOUNTER — Telehealth: Payer: Self-pay | Admitting: *Deleted

## 2020-07-20 NOTE — Telephone Encounter (Signed)
-----   Message from Quintella Reichert, MD sent at 07/19/2020 11:34 AM EDT ----- AHI too high - please find out how often he is changin out the cushion on his device and if he is sleeping supine

## 2020-07-20 NOTE — Telephone Encounter (Signed)
The patient has been notified of the result and verbalized understanding.  All questions (if any) were answered. Latrelle Dodrill, CMA 07/20/2020 10:52 AM.

## 2020-07-27 DIAGNOSIS — H6122 Impacted cerumen, left ear: Secondary | ICD-10-CM | POA: Diagnosis not present

## 2020-09-20 ENCOUNTER — Other Ambulatory Visit (HOSPITAL_COMMUNITY): Payer: Self-pay | Admitting: Cardiology

## 2020-10-07 DIAGNOSIS — G4733 Obstructive sleep apnea (adult) (pediatric): Secondary | ICD-10-CM | POA: Diagnosis not present

## 2020-10-21 ENCOUNTER — Encounter (HOSPITAL_COMMUNITY): Payer: Self-pay | Admitting: Cardiology

## 2020-10-21 ENCOUNTER — Other Ambulatory Visit: Payer: Self-pay

## 2020-10-21 ENCOUNTER — Ambulatory Visit (HOSPITAL_COMMUNITY)
Admission: RE | Admit: 2020-10-21 | Discharge: 2020-10-21 | Disposition: A | Discharge status: Home / Self Care | Payer: Medicare Other | Source: Ambulatory Visit | Attending: Cardiology | Admitting: Cardiology

## 2020-10-21 VITALS — BP 110/70 | HR 47 | Wt 135.8 lb

## 2020-10-21 DIAGNOSIS — Z951 Presence of aortocoronary bypass graft: Secondary | ICD-10-CM | POA: Diagnosis not present

## 2020-10-21 DIAGNOSIS — Z8674 Personal history of sudden cardiac arrest: Secondary | ICD-10-CM | POA: Insufficient documentation

## 2020-10-21 DIAGNOSIS — G4733 Obstructive sleep apnea (adult) (pediatric): Secondary | ICD-10-CM | POA: Insufficient documentation

## 2020-10-21 DIAGNOSIS — I4510 Unspecified right bundle-branch block: Secondary | ICD-10-CM | POA: Insufficient documentation

## 2020-10-21 DIAGNOSIS — I350 Nonrheumatic aortic (valve) stenosis: Secondary | ICD-10-CM | POA: Diagnosis not present

## 2020-10-21 DIAGNOSIS — N183 Chronic kidney disease, stage 3 unspecified: Secondary | ICD-10-CM | POA: Diagnosis not present

## 2020-10-21 DIAGNOSIS — I252 Old myocardial infarction: Secondary | ICD-10-CM | POA: Insufficient documentation

## 2020-10-21 DIAGNOSIS — Z7982 Long term (current) use of aspirin: Secondary | ICD-10-CM | POA: Insufficient documentation

## 2020-10-21 DIAGNOSIS — Z79899 Other long term (current) drug therapy: Secondary | ICD-10-CM | POA: Diagnosis not present

## 2020-10-21 DIAGNOSIS — I4891 Unspecified atrial fibrillation: Secondary | ICD-10-CM

## 2020-10-21 DIAGNOSIS — I130 Hypertensive heart and chronic kidney disease with heart failure and stage 1 through stage 4 chronic kidney disease, or unspecified chronic kidney disease: Secondary | ICD-10-CM | POA: Diagnosis not present

## 2020-10-21 DIAGNOSIS — I2510 Atherosclerotic heart disease of native coronary artery without angina pectoris: Secondary | ICD-10-CM | POA: Insufficient documentation

## 2020-10-21 DIAGNOSIS — E785 Hyperlipidemia, unspecified: Secondary | ICD-10-CM | POA: Diagnosis not present

## 2020-10-21 DIAGNOSIS — Z7902 Long term (current) use of antithrombotics/antiplatelets: Secondary | ICD-10-CM | POA: Insufficient documentation

## 2020-10-21 DIAGNOSIS — E7800 Pure hypercholesterolemia, unspecified: Secondary | ICD-10-CM

## 2020-10-21 DIAGNOSIS — Z7901 Long term (current) use of anticoagulants: Secondary | ICD-10-CM | POA: Insufficient documentation

## 2020-10-21 DIAGNOSIS — I441 Atrioventricular block, second degree: Secondary | ICD-10-CM | POA: Diagnosis not present

## 2020-10-21 DIAGNOSIS — R001 Bradycardia, unspecified: Secondary | ICD-10-CM | POA: Insufficient documentation

## 2020-10-21 DIAGNOSIS — I5032 Chronic diastolic (congestive) heart failure: Secondary | ICD-10-CM | POA: Insufficient documentation

## 2020-10-21 DIAGNOSIS — Z125 Encounter for screening for malignant neoplasm of prostate: Secondary | ICD-10-CM | POA: Diagnosis not present

## 2020-10-21 DIAGNOSIS — Z8249 Family history of ischemic heart disease and other diseases of the circulatory system: Secondary | ICD-10-CM | POA: Diagnosis not present

## 2020-10-21 LAB — BASIC METABOLIC PANEL
Anion gap: 5 (ref 5–15)
BUN: 34 mg/dL — ABNORMAL HIGH (ref 8–23)
CO2: 26 mmol/L (ref 22–32)
Calcium: 9.2 mg/dL (ref 8.9–10.3)
Chloride: 107 mmol/L (ref 98–111)
Creatinine, Ser: 1.58 mg/dL — ABNORMAL HIGH (ref 0.61–1.24)
GFR, Estimated: 42 mL/min — ABNORMAL LOW (ref >60)
Glucose, Bld: 105 mg/dL — ABNORMAL HIGH (ref 70–99)
Potassium: 4.2 mmol/L (ref 3.5–5.1)
Sodium: 138 mmol/L (ref 135–145)

## 2020-10-21 LAB — CBC
HCT: 34.4 % — ABNORMAL LOW (ref 39.0–52.0)
Hemoglobin: 11 g/dL — ABNORMAL LOW (ref 13.0–17.0)
MCH: 32.3 pg (ref 26.0–34.0)
MCHC: 32 g/dL (ref 30.0–36.0)
MCV: 100.9 fL — ABNORMAL HIGH (ref 80.0–100.0)
Platelets: 207 10*3/uL (ref 150–400)
RBC: 3.41 MIL/uL — ABNORMAL LOW (ref 4.22–5.81)
RDW: 13.7 % (ref 11.5–15.5)
WBC: 4.9 10*3/uL (ref 4.0–10.5)
nRBC: 0 % (ref 0.0–0.2)

## 2020-10-21 MED ORDER — METOPROLOL SUCCINATE ER 25 MG PO TB24
12.5000 mg | ORAL_TABLET | Freq: Every day | ORAL | 3 refills | Status: DC
Start: 2020-10-21 — End: 2021-05-23

## 2020-10-21 NOTE — Patient Instructions (Addendum)
EKG done today.  Labs done today. We will contact you only if your labs are abnormal.  DECREASE Metoprolol to 12.5mg  (1/2 tablet) by mouth daily.  No medication changes were made. Please continue all current medications as prescribed.  Your physician recommends that you schedule a follow-up appointment in: 4 months with an echo prior to your exam.  Your physician has requested that you have an echocardiogram. Echocardiography is a painless test that uses sound waves to create images of your heart. It provides your doctor with information about the size and shape of your heart and how well your heart's chambers and valves are working. This procedure takes approximately one hour. There are no restrictions for this procedure.  If you have any questions or concerns before your next appointment please send [place] a message through [place] or call our office at [phone].    TO LEAVE A MESSAGE FOR THE NURSE SELECT OPTION 2, PLEASE LEAVE A MESSAGE INCLUDING: YOUR NAME DATE OF BIRTH CALL BACK NUMBER REASON FOR CALL**this is important as we prioritize the call backs  YOU WILL RECEIVE A CALL BACK THE SAME DAY AS LONG AS YOU CALL BEFORE 4:00 PM   Do the following things EVERYDAY: Weigh yourself in the morning before breakfast. Write it down and keep it in a log. Take your medicines as prescribed Eat low salt foods--Limit salt (sodium) to 2000 mg per day.  Stay as active as you can everyday Limit all fluids for the day to less than 2 liters   At the [facility], you and your health needs are our priority. As part of our continuing mission to provide you with exceptional heart care, we have created designated Provider Care Teams. These Care Teams include your primary Cardiologist (physician) and Advanced Practice Providers (APPs- Physician Assistants and Nurse Practitioners) who all work together to provide you with the care you need, when you need it.   You may see any of the  following providers on your designated Care Team at your next follow up: Dr Arvilla Meres Dr Carron Curie, NP Robbie Lis, [place] Karle Plumber, PharmD   Please be sure to bring in all your medications bottles to every appointment.

## 2020-10-21 NOTE — Progress Notes (Signed)
Patient ID: [name] [name], male   DOB: 1928-03-29, 87 y.o.   MRN: [id number] PCP: Dr. Chilton Si Cardiology: Dr. Shirlee Latch  87 y.o.with history of CAD s/p CABG and mitral regurgitation s/p mitral valve repair presents for followup of CAD.   Patient had most recent echo in 5/20, showing EF 50-55% with inferior hypokinesis, mild AS, mild-moderate AI, s/p MV repair with stable mitral valve.    In 6/20, he had left TKR.  Cardiology was consulted while he was in the hospital for ?atrial fibrillation, but this was thought to actually be frequent PACs versus MAT.    At a prior appointment, he reported significant fatigue and dyspnea post-op. CTA chest showed no PE or other active disease.  Cardiolite was done in 7/20 showing EF 61%, small fixed basal anterior defect, no ischemia.  I had him increase Lasix to 40 mg daily due to concern for possible diastolic CHF.  He wore a 30 day monitor which showed short NSVT runs, no atrial fibrillation.  Sleep study showed severe OSA and he was started on CPAP.  Zio patch again in 12/20 showed 6.3% PVCs, Toprol XL was started.   Echo in 12/21 showed EF 50-55%, septal-lateral dyssynchrony, mild LVH, nomral RV, s/p MV repair with trivial MR and probably mild mitral stenosis, moderate AI, mild AS mean gradient 12 mmHg and AVA 1.72 cm^2.   Patient is doing well in general.  He has some memory deficit but not severe.  He walks for exercise and does yardwork.  He walks his dogs daily. No chest pain or exertional dyspnea.  HR in the high 40s today but he denies lightheadedness.  No falls or syncope.   ECG (personally reviewed): Sinus brady 47, RBBB  Labs (2/14): K 4, creatinine 1.1 Labs (1/15): K 4, creatinine 1.1, LDL 64, HDL 57, pro-BNP 112 Labs (1/85): K 4.1, creatinine 1.03, LDL 66, HDL 53 Labs (5/17): LDL 57, HDL 71 Labs (7/17): K 4.2, creatinine 1.12 Labs (8/18): K 4.3, creatinine 1.07, LDL 58 Labs (1/20): LDL 66 Labs (6/20): K 3.6, creatinine 1.21, hgb 7.3 Labs (7/20): K  4.4, creatinine 1.3, BNP 165, hgb 7.5 => 8.2.  Labs (8/20): K 4, creatinine 1.4 Labs (12/20): LDL 75, creatinine 1.34 Labs (12/21): LDL 77, HDL 53, K 5, creatinine 1.5  PMH: 1. CAD: Acute MI in 2006 with ventricular fibrillation arrest.  Patient had 1st generation DES to LCx.  Patient then had CABG 2009 with LIMA-OM and SVG-PDA.  Plan has been to continue DAPT long-term because of 1st generation DES.  - Cardiolite (7/20): EF 61%, small fixed basal anterior defect, no ischemia. 2. Nephrolithiasis 3. HTN 4. Hyperlipidemia 5. Diverticulosis 6. Aortic stenosis: Mild on 7/20 echo. 7. Mitral valve prolapse with severe MR: MV repair in 2009.  Echo (2/14) with EF 50-55%, inferior HK, mild AS with mean gradient 12 mmHg, s/p MV repair with mild MR and no significant MS (mean gradient 3 mmHg), mild RV dilation, PA systolic pressure 37 mmHg.  Echo (3/16) with EF 50-55%, mild focal basal septal hypertrophy, mild aortic stenosis mean gradient 14 mmHg, s/p MV repair with mild MR and no MS, PASP 38 mmHg.  - Echo (5/20): EF 50-55% with inferior hypokinesis, mild AS, mild-moderate AI, s/p MV repair with stable mitral valve. - Echo (12/21): EF 50-55%, septal-lateral dyssynchrony, mild LVH, nomral RV, s/p MV repair with trivial MR and probably mild mitral stenosis, moderate AI, mild AS mean gradient 12 mmHg and AVA 1.72 cm^2.  8. Thyroid nodule:  Biopsy with benign follicular nodule in 2018. 9. BPH  10. OSA: Uses CPAP.  11. Palpitations: 30 day monitor (8/20) showed short NSVT runs, no atrial fibrillation.  - 12/20 Zio patch: 6.3% PVCs 12. Aortic valve disorder: Echo in 12/21 with moderate AI, mild AS.  13. CKD: Stage 3.   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   FH: HTN  ROS: All systems reviewed and negative except as per HPI.   Current Outpatient Medications  Medication Sig Dispense Refill   aspirin EC 81 MG tablet Take 1 tablet (81 mg total) by mouth daily. 90  tablet 3   clopidogrel (PLAVIX) 75 MG tablet TAKE 1 TABLET ONCE DAILY. 30 tablet 11   ferrous sulfate (FERROUSUL) 325 (65 FE) MG tablet Take 1 tablet (325 mg total) by mouth 3 (three) times daily with meals.  3   furosemide (LASIX) 20 MG tablet TAKE ONE TABLET EVERY OTHER DAY. 45 tablet 3   furosemide (LASIX) 40 MG tablet TAKE 1 TABLET DAILY ALTERNATING WITH 1/2 TABLET EVERY OTHER DAY. 126 tablet 0   lisinopril (ZESTRIL) 5 MG tablet TAKE 1 TABLET ONCE DAILY. 30 tablet 11   Multiple Vitamin (MULTIVITAMIN) tablet Take 1 tablet by mouth daily.       potassium chloride SA (KLOR-CON) 20 MEQ tablet TAKE 1 TABLET BY MOUTH DAILY. 30 tablet 11   simvastatin (ZOCOR) 40 MG tablet TAKE 1 TABLET ONCE DAILY. 30 tablet 11   Tamsulosin HCl (FLOMAX) 0.4 MG CAPS Take 0.4 mg by mouth daily.      metoprolol succinate (TOPROL-XL) 25 MG 24 hr tablet Take 0.5 tablets (12.5 mg total) by mouth daily. 45 tablet 3   No current facility-administered medications for this encounter.    BP 110/70   Pulse (!) 47   Wt 61.6 kg (135 lb 12.8 oz)   SpO2 99%   BMI 20.65 kg/m  General: NAD Neck: No JVD, no thyromegaly or thyroid nodule.  Lungs: Clear to auscultation bilaterally with normal respiratory effort. CV: Nondisplaced PMI.  Heart regular S1/S2, no S3/S4, 2/6 SEM RUSB with clear S2.  Trace ankle edema.  No carotid bruit.  Normal pedal pulses.  Abdomen: Soft, nontender, no hepatosplenomegaly, no distention.  Skin: Intact without lesions or rashes.  Neurologic: Alert and oriented x 3.  Psych: Normal affect. Extremities: No clubbing or cyanosis.  HEENT: Normal.   Assessment/Plan: 1. CAD: S/p 1st generation DES to LCx and later CABG. Cardiolite was done in 7/20, this showed no ischemia.  Currently, no significant exertional symptoms.  - Patient has been on ASA and Plavix long-term and plan to continue this regimen.  CBC today.  - Continue statin, check lipids today.  2. S/p MV repair: Echo in 12/21 showed trivial  MR, probably mild stenosis.  3. Aortic valve disorder: Echo in 12/21 with moderate aortic insufficiency, mild aortic stenosis.  He would be a candidate for TAVR if this worsens.  - Repeat echo at followup in 4 months.  4. Hyperlipidemia: Continue statin.  Check lipids today.   5. HTN: BP controlled.  6. Chronic diastolic CHF: Echo in 12/21 with EF 50-55%.  He is not volume overloaded on exam.  - Continue Lasix 40 daily alternating with 20 daily.  BMET today.   7. PVCs: Now on a beta blocker with no palpitations.  8. OSA: He felt markedly better after starting on CPAP.  9. Bradycardia: HR in the upper 40s.   - Decrease Toprol XL to  12.5 mg daily.   Followup 4 months with echo  Marca Ancona 10/21/2020

## 2020-10-28 DIAGNOSIS — Z0000 Encounter for general adult medical examination without abnormal findings: Secondary | ICD-10-CM | POA: Diagnosis not present

## 2020-10-28 DIAGNOSIS — I130 Hypertensive heart and chronic kidney disease with heart failure and stage 1 through stage 4 chronic kidney disease, or unspecified chronic kidney disease: Secondary | ICD-10-CM | POA: Diagnosis not present

## 2020-10-28 DIAGNOSIS — I5032 Chronic diastolic (congestive) heart failure: Secondary | ICD-10-CM | POA: Diagnosis not present

## 2020-10-28 DIAGNOSIS — I700 Atherosclerosis of aorta: Secondary | ICD-10-CM | POA: Diagnosis not present

## 2020-11-12 DIAGNOSIS — C44529 Squamous cell carcinoma of skin of other part of trunk: Secondary | ICD-10-CM | POA: Diagnosis not present

## 2020-11-12 DIAGNOSIS — Z85828 Personal history of other malignant neoplasm of skin: Secondary | ICD-10-CM | POA: Diagnosis not present

## 2020-12-08 DIAGNOSIS — W312XXA Contact with powered woodworking and forming machines, initial encounter: Secondary | ICD-10-CM | POA: Diagnosis not present

## 2020-12-08 DIAGNOSIS — S61313A Laceration without foreign body of left middle finger with damage to nail, initial encounter: Secondary | ICD-10-CM | POA: Diagnosis not present

## 2020-12-19 ENCOUNTER — Other Ambulatory Visit (HOSPITAL_COMMUNITY): Payer: Self-pay | Admitting: Cardiology

## 2020-12-22 DIAGNOSIS — M25511 Pain in right shoulder: Secondary | ICD-10-CM | POA: Diagnosis not present

## 2021-01-13 DIAGNOSIS — T162XXA Foreign body in left ear, initial encounter: Secondary | ICD-10-CM | POA: Diagnosis not present

## 2021-01-13 DIAGNOSIS — H6121 Impacted cerumen, right ear: Secondary | ICD-10-CM | POA: Diagnosis not present

## 2021-01-18 DIAGNOSIS — H6122 Impacted cerumen, left ear: Secondary | ICD-10-CM | POA: Diagnosis not present

## 2021-01-28 DIAGNOSIS — G4733 Obstructive sleep apnea (adult) (pediatric): Secondary | ICD-10-CM | POA: Diagnosis not present

## 2021-02-08 ENCOUNTER — Encounter (HOSPITAL_COMMUNITY): Payer: Self-pay | Admitting: Cardiology

## 2021-02-08 ENCOUNTER — Other Ambulatory Visit: Payer: Self-pay

## 2021-02-08 ENCOUNTER — Ambulatory Visit (HOSPITAL_COMMUNITY)
Admission: RE | Admit: 2021-02-08 | Discharge: 2021-02-08 | Disposition: A | Discharge status: Home / Self Care | Payer: Medicare Other | Source: Ambulatory Visit | Attending: Cardiology | Admitting: Cardiology

## 2021-02-08 ENCOUNTER — Ambulatory Visit (HOSPITAL_COMMUNITY): Payer: Medicare Other

## 2021-02-08 VITALS — BP 116/60 | HR 53 | Wt 136.4 lb

## 2021-02-08 DIAGNOSIS — I5032 Chronic diastolic (congestive) heart failure: Secondary | ICD-10-CM | POA: Diagnosis not present

## 2021-02-08 DIAGNOSIS — G4733 Obstructive sleep apnea (adult) (pediatric): Secondary | ICD-10-CM | POA: Diagnosis not present

## 2021-02-08 DIAGNOSIS — I350 Nonrheumatic aortic (valve) stenosis: Secondary | ICD-10-CM | POA: Diagnosis not present

## 2021-02-08 DIAGNOSIS — Z7902 Long term (current) use of antithrombotics/antiplatelets: Secondary | ICD-10-CM | POA: Insufficient documentation

## 2021-02-08 DIAGNOSIS — Z96652 Presence of left artificial knee joint: Secondary | ICD-10-CM | POA: Diagnosis not present

## 2021-02-08 DIAGNOSIS — I4510 Unspecified right bundle-branch block: Secondary | ICD-10-CM | POA: Insufficient documentation

## 2021-02-08 DIAGNOSIS — Z8674 Personal history of sudden cardiac arrest: Secondary | ICD-10-CM | POA: Diagnosis not present

## 2021-02-08 DIAGNOSIS — Z951 Presence of aortocoronary bypass graft: Secondary | ICD-10-CM | POA: Diagnosis not present

## 2021-02-08 DIAGNOSIS — I351 Nonrheumatic aortic (valve) insufficiency: Secondary | ICD-10-CM | POA: Insufficient documentation

## 2021-02-08 DIAGNOSIS — I2510 Atherosclerotic heart disease of native coronary artery without angina pectoris: Secondary | ICD-10-CM | POA: Diagnosis not present

## 2021-02-08 DIAGNOSIS — Z9989 Dependence on other enabling machines and devices: Secondary | ICD-10-CM | POA: Diagnosis not present

## 2021-02-08 DIAGNOSIS — N183 Chronic kidney disease, stage 3 unspecified: Secondary | ICD-10-CM | POA: Insufficient documentation

## 2021-02-08 DIAGNOSIS — E785 Hyperlipidemia, unspecified: Secondary | ICD-10-CM | POA: Diagnosis not present

## 2021-02-08 DIAGNOSIS — I493 Ventricular premature depolarization: Secondary | ICD-10-CM | POA: Diagnosis not present

## 2021-02-08 DIAGNOSIS — Z79899 Other long term (current) drug therapy: Secondary | ICD-10-CM | POA: Insufficient documentation

## 2021-02-08 DIAGNOSIS — Z7982 Long term (current) use of aspirin: Secondary | ICD-10-CM | POA: Insufficient documentation

## 2021-02-08 DIAGNOSIS — I130 Hypertensive heart and chronic kidney disease with heart failure and stage 1 through stage 4 chronic kidney disease, or unspecified chronic kidney disease: Secondary | ICD-10-CM | POA: Insufficient documentation

## 2021-02-08 DIAGNOSIS — M25569 Pain in unspecified knee: Secondary | ICD-10-CM | POA: Insufficient documentation

## 2021-02-08 DIAGNOSIS — I498 Other specified cardiac arrhythmias: Secondary | ICD-10-CM | POA: Diagnosis not present

## 2021-02-08 DIAGNOSIS — E7800 Pure hypercholesterolemia, unspecified: Secondary | ICD-10-CM | POA: Diagnosis not present

## 2021-02-08 LAB — LIPID PANEL
Cholesterol: 163 mg/dL (ref 0–200)
HDL: 74 mg/dL (ref >40)
LDL Cholesterol: 82 mg/dL (ref 0–99)
Total CHOL/HDL Ratio: 2.2 RATIO
Triglycerides: 35 mg/dL (ref <150)
VLDL: 7 mg/dL (ref 0–40)

## 2021-02-08 LAB — BASIC METABOLIC PANEL
Anion gap: 7 (ref 5–15)
BUN: 33 mg/dL — ABNORMAL HIGH (ref 8–23)
CO2: 22 mmol/L (ref 22–32)
Calcium: 9.5 mg/dL (ref 8.9–10.3)
Chloride: 108 mmol/L (ref 98–111)
Creatinine, Ser: 1.32 mg/dL — ABNORMAL HIGH (ref 0.61–1.24)
GFR, Estimated: 51 mL/min — ABNORMAL LOW (ref >60)
Glucose, Bld: 130 mg/dL — ABNORMAL HIGH (ref 70–99)
Potassium: 4.4 mmol/L (ref 3.5–5.1)
Sodium: 137 mmol/L (ref 135–145)

## 2021-02-08 LAB — CBC
HCT: 31.8 % — ABNORMAL LOW (ref 39.0–52.0)
Hemoglobin: 10.3 g/dL — ABNORMAL LOW (ref 13.0–17.0)
MCH: 31.9 pg (ref 26.0–34.0)
MCHC: 32.4 g/dL (ref 30.0–36.0)
MCV: 98.5 fL (ref 80.0–100.0)
Platelets: 214 10*3/uL (ref 150–400)
RBC: 3.23 MIL/uL — ABNORMAL LOW (ref 4.22–5.81)
RDW: 13.8 % (ref 11.5–15.5)
WBC: 4.1 10*3/uL (ref 4.0–10.5)
nRBC: 0 % (ref 0.0–0.2)

## 2021-02-08 NOTE — Progress Notes (Signed)
Patient ID: [name] [name], male   DOB: 1929-01-26, 87 y.o.   MRN: [id number] PCP: Dr. Nyoka Cowden Cardiology: Dr. Aundra Dubin  87 y.o.with history of CAD s/p CABG and mitral regurgitation s/p mitral valve repair presents for followup of CAD.   Patient had most recent echo in 5/20, showing EF 50-55% with inferior hypokinesis, mild AS, mild-moderate AI, s/p MV repair with stable mitral valve.    In 6/20, he had left TKR.  Cardiology was consulted while he was in the hospital for ?atrial fibrillation, but this was thought to actually be frequent PACs versus MAT.    At a prior appointment, he reported significant fatigue and dyspnea post-op. CTA chest showed no PE or other active disease.  Cardiolite was done in 7/20 showing EF 61%, small fixed basal anterior defect, no ischemia.  I had him increase Lasix to 40 mg daily due to concern for possible diastolic CHF.  He wore a 30 day monitor which showed short NSVT runs, no atrial fibrillation.  Sleep study showed severe OSA and he was started on CPAP.  Zio patch again in 12/20 showed 6.3% PVCs, Toprol XL was started.   Echo in 12/21 showed EF 50-55%, septal-lateral dyssynchrony, mild LVH, nomral RV, s/p MV repair with trivial MR and probably mild mitral stenosis, moderate AI, mild AS mean gradient 12 mmHg and AVA 1.72 cm^2.   Patient is doing well in general.  He has some memory deficit but not severe.  He no longer jogs due to knee pain but walks his dogs daily with no exertional dyspnea or chest pain.  Using CPAP.  His wife is in a memory care unit, he visits her frequently.  Weight is stable. No palpitations or lightheadedness.   ECG (personally reviewed): NSR, iRBBB  Labs (2/14): K 4, creatinine 1.1 Labs (1/15): K 4, creatinine 1.1, LDL 64, HDL 57, pro-BNP 112 Labs (2/16): K 4.1, creatinine 1.03, LDL 66, HDL 53 Labs (5/17): LDL 57, HDL 71 Labs (7/17): K 4.2, creatinine 1.12 Labs (8/18): K 4.3, creatinine 1.07, LDL 58 Labs (1/20): LDL 66 Labs (6/20): K 3.6,  creatinine 1.21, hgb 7.3 Labs (7/20): K 4.4, creatinine 1.3, BNP 165, hgb 7.5 => 8.2.  Labs (8/20): K 4, creatinine 1.4 Labs (12/20): LDL 75, creatinine 1.34 Labs (12/21): LDL 77, HDL 53, K 5, creatinine 1.5 Labs (8/22): K 4.2, creatinine 1.58  PMH: 1. CAD: Acute MI in 2006 with ventricular fibrillation arrest.  Patient had 1st generation DES to LCx.  Patient then had CABG 2009 with LIMA-OM and SVG-PDA.  Plan has been to continue DAPT long-term because of 1st generation DES.  - Cardiolite (7/20): EF 61%, small fixed basal anterior defect, no ischemia. 2. Nephrolithiasis 3. HTN 4. Hyperlipidemia 5. Diverticulosis 6. Aortic stenosis: Mild on 7/20 echo. 7. Mitral valve prolapse with severe MR: MV repair in 2009.  Echo (2/14) with EF 50-55%, inferior HK, mild AS with mean gradient 12 mmHg, s/p MV repair with mild MR and no significant MS (mean gradient 3 mmHg), mild RV dilation, PA systolic pressure 37 mmHg.  Echo (3/16) with EF 50-55%, mild focal basal septal hypertrophy, mild aortic stenosis mean gradient 14 mmHg, s/p MV repair with mild MR and no MS, PASP 38 mmHg.  - Echo (5/20): EF 50-55% with inferior hypokinesis, mild AS, mild-moderate AI, s/p MV repair with stable mitral valve. - Echo (12/21): EF 50-55%, septal-lateral dyssynchrony, mild LVH, nomral RV, s/p MV repair with trivial MR and probably mild mitral stenosis, moderate AI, mild  AS mean gradient 12 mmHg and AVA 1.72 cm^2.  8. Thyroid nodule: Biopsy with benign follicular nodule in 2018. 9. BPH  10. OSA: Uses CPAP.  11. Palpitations: 30 day monitor (8/20) showed short NSVT runs, no atrial fibrillation.  - 12/20 Zio patch: 6.3% PVCs 12. Aortic valve disorder: Echo in 12/21 with moderate AI, mild AS.  13. CKD: Stage 3.   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   FH: HTN  ROS: All systems reviewed and negative except as per HPI.   Current Outpatient Medications  Medication Sig Dispense Refill    aspirin EC 81 MG tablet Take 1 tablet (81 mg total) by mouth daily. 90 tablet 3   clopidogrel (PLAVIX) 75 MG tablet TAKE 1 TABLET ONCE DAILY. 30 tablet 11   ferrous sulfate (FERROUSUL) 325 (65 FE) MG tablet Take 1 tablet (325 mg total) by mouth 3 (three) times daily with meals.  3   furosemide (LASIX) 20 MG tablet TAKE ONE TABLET EVERY OTHER DAY. 45 tablet 3   furosemide (LASIX) 40 MG tablet TAKE 1 TABLET DAILY ALTERNATING WITH 1/2 TABLET EVERY OTHER DAY. 126 tablet 0   lisinopril (ZESTRIL) 5 MG tablet TAKE 1 TABLET ONCE DAILY. 30 tablet 11   metoprolol succinate (TOPROL-XL) 25 MG 24 hr tablet Take 0.5 tablets (12.5 mg total) by mouth daily. 45 tablet 3   Multiple Vitamin (MULTIVITAMIN) tablet Take 1 tablet by mouth daily.       potassium chloride SA (KLOR-CON) 20 MEQ tablet TAKE 1 TABLET BY MOUTH DAILY. 30 tablet 11   simvastatin (ZOCOR) 40 MG tablet TAKE 1 TABLET ONCE DAILY. 30 tablet 11   Tamsulosin HCl (FLOMAX) 0.4 MG CAPS Take 0.4 mg by mouth daily.      No current facility-administered medications for this encounter.    BP 116/60    Pulse (!) 53    Wt 61.9 kg (136 lb 6.4 oz)    SpO2 99%    BMI 20.74 kg/m  General: NAD Neck: No JVD, no thyromegaly or thyroid nodule.  Lungs: Clear to auscultation bilaterally with normal respiratory effort. CV: Nondisplaced PMI.  Heart regular S1/S2, no S3/S4, 2/6 SEM RUSB with clear S2.  No peripheral edema.  No carotid bruit.  Normal pedal pulses.  Abdomen: Soft, nontender, no hepatosplenomegaly, no distention.  Skin: Intact without lesions or rashes.  Neurologic: Alert and oriented x 3.  Psych: Normal affect. Extremities: No clubbing or cyanosis.  HEENT: Normal.   Assessment/Plan: 1. CAD: S/p 1st generation DES to LCx and later CABG. Cardiolite was done in 7/20, this showed no ischemia.  Currently, no significant exertional symptoms.  - Patient has been on ASA and Plavix long-term.  Based on current data, he could stop ASA and continue Plavix,  but he prefers to continue both.  - Continue statin, check lipids today.  2. S/p MV repair: Echo in 12/21 showed trivial MR, probably mild stenosis.  - I will arrange for repeat echo.  3. Aortic valve disorder: Echo in 12/21 with moderate aortic insufficiency, mild aortic stenosis.  He would be a candidate for TAVR if this worsens.  - I will arrange for repeat echo. .  4. Hyperlipidemia: Continue statin.  Check lipids today.   5. HTN: BP controlled.  6. Chronic diastolic CHF: Echo in 12/21 with EF 50-55%.  He is not volume overloaded on exam.  - Continue Lasix 40 daily alternating with 20 daily.  BMET today.   7. PVCs:  Now on a beta blocker with no palpitations.  8. OSA: He felt markedly better after starting on CPAP.  9. Bradycardia: HR higher in the 50s today after decreasing Toprol XL at last appointment.    Followup 6 months.   Loralie Champagne 02/08/2021

## 2021-02-08 NOTE — Patient Instructions (Signed)
EKG done today.  Labs done today. We will contact you only if your labs are abnormal.  No medication changes were made. Please continue all current medications as prescribed.  Your physician recommends that you schedule a follow-up appointment soon for an echo and in 6 months with Dr. Shirlee Latch. Please contact our office in May 2023 to schedule a June 2023 appointment.   If you have any questions or concerns before your next appointment please send [place] a message through [place] or call our office at [phone].    TO LEAVE A MESSAGE FOR THE NURSE SELECT OPTION 2, PLEASE LEAVE A MESSAGE INCLUDING: YOUR NAME DATE OF BIRTH CALL BACK NUMBER REASON FOR CALL**this is important as we prioritize the call backs  YOU WILL RECEIVE A CALL BACK THE SAME DAY AS LONG AS YOU CALL BEFORE 4:00 PM   Do the following things EVERYDAY: Weigh yourself in the morning before breakfast. Write it down and keep it in a log. Take your medicines as prescribed Eat low salt foods--Limit salt (sodium) to 2000 mg per day.  Stay as active as you can everyday Limit all fluids for the day to less than 2 liters   At the [facility], you and your health needs are our priority. As part of our continuing mission to provide you with exceptional heart care, we have created designated Provider Care Teams. These Care Teams include your primary Cardiologist (physician) and Advanced Practice Providers (APPs- Physician Assistants and Nurse Practitioners) who all work together to provide you with the care you need, when you need it.   You may see any of the following providers on your designated Care Team at your next follow up: Dr Arvilla Meres Dr Carron Curie, NP Robbie Lis, [place] Karle Plumber, PharmD   Please be sure to bring in all your medications bottles to every appointment.

## 2021-03-01 DIAGNOSIS — L308 Other specified dermatitis: Secondary | ICD-10-CM | POA: Diagnosis not present

## 2021-03-01 DIAGNOSIS — Z85828 Personal history of other malignant neoplasm of skin: Secondary | ICD-10-CM | POA: Diagnosis not present

## 2021-03-01 DIAGNOSIS — L570 Actinic keratosis: Secondary | ICD-10-CM | POA: Diagnosis not present

## 2021-03-01 DIAGNOSIS — L814 Other melanin hyperpigmentation: Secondary | ICD-10-CM | POA: Diagnosis not present

## 2021-03-08 ENCOUNTER — Other Ambulatory Visit (HOSPITAL_COMMUNITY): Payer: Self-pay | Admitting: Cardiology

## 2021-03-29 ENCOUNTER — Ambulatory Visit (HOSPITAL_COMMUNITY)
Admission: RE | Admit: 2021-03-29 | Discharge: 2021-03-29 | Disposition: A | Discharge status: Home / Self Care | Payer: Medicare Other | Source: Ambulatory Visit | Attending: Cardiology | Admitting: Cardiology

## 2021-03-29 ENCOUNTER — Other Ambulatory Visit: Payer: Self-pay

## 2021-03-29 DIAGNOSIS — R9431 Abnormal electrocardiogram [ECG] [EKG]: Secondary | ICD-10-CM | POA: Insufficient documentation

## 2021-03-29 DIAGNOSIS — R0600 Dyspnea, unspecified: Secondary | ICD-10-CM | POA: Diagnosis not present

## 2021-03-29 DIAGNOSIS — I5040 Unspecified combined systolic (congestive) and diastolic (congestive) heart failure: Secondary | ICD-10-CM | POA: Insufficient documentation

## 2021-03-29 DIAGNOSIS — I2510 Atherosclerotic heart disease of native coronary artery without angina pectoris: Secondary | ICD-10-CM | POA: Insufficient documentation

## 2021-03-29 DIAGNOSIS — R0602 Shortness of breath: Secondary | ICD-10-CM | POA: Diagnosis not present

## 2021-03-29 DIAGNOSIS — I083 Combined rheumatic disorders of mitral, aortic and tricuspid valves: Secondary | ICD-10-CM | POA: Insufficient documentation

## 2021-03-29 DIAGNOSIS — R4182 Altered mental status, unspecified: Secondary | ICD-10-CM | POA: Diagnosis not present

## 2021-03-29 DIAGNOSIS — I4891 Unspecified atrial fibrillation: Secondary | ICD-10-CM

## 2021-03-29 LAB — ECHOCARDIOGRAM COMPLETE
AR max vel: 1.87 cm2
AV Area VTI: 1.54 cm2
AV Area mean vel: 1.63 cm2
AV Mean grad: 16.7 mmHg
AV Peak grad: 32.6 mmHg
Ao pk vel: 2.85 m/s
Area-P 1/2: 1.52 cm2
Calc EF: 63.3 %
P 1/2 time: 648 msec
S' Lateral: 2.9 cm
Single Plane A2C EF: 59.9 %
Single Plane A4C EF: 64.3 %

## 2021-03-29 NOTE — Progress Notes (Signed)
°  Echocardiogram 2D Echocardiogram has been performed.  [name] [name] 03/29/2021, 2:59 PM

## 2021-04-05 DIAGNOSIS — M25511 Pain in right shoulder: Secondary | ICD-10-CM | POA: Diagnosis not present

## 2021-04-20 DIAGNOSIS — R351 Nocturia: Secondary | ICD-10-CM | POA: Diagnosis not present

## 2021-04-20 DIAGNOSIS — N401 Enlarged prostate with lower urinary tract symptoms: Secondary | ICD-10-CM | POA: Diagnosis not present

## 2021-05-10 DIAGNOSIS — H52203 Unspecified astigmatism, bilateral: Secondary | ICD-10-CM | POA: Diagnosis not present

## 2021-05-23 ENCOUNTER — Other Ambulatory Visit (HOSPITAL_COMMUNITY): Payer: Self-pay | Admitting: Cardiology

## 2021-05-29 ENCOUNTER — Other Ambulatory Visit (HOSPITAL_COMMUNITY): Payer: Self-pay | Admitting: Cardiology

## 2021-05-30 DIAGNOSIS — H6123 Impacted cerumen, bilateral: Secondary | ICD-10-CM | POA: Diagnosis not present

## 2021-07-14 DIAGNOSIS — G4733 Obstructive sleep apnea (adult) (pediatric): Secondary | ICD-10-CM | POA: Diagnosis not present

## 2021-07-18 DIAGNOSIS — H6123 Impacted cerumen, bilateral: Secondary | ICD-10-CM | POA: Diagnosis not present

## 2021-08-19 ENCOUNTER — Telehealth (HOSPITAL_COMMUNITY): Payer: Self-pay | Admitting: Cardiology

## 2021-08-23 ENCOUNTER — Other Ambulatory Visit (HOSPITAL_COMMUNITY): Payer: Self-pay | Admitting: Cardiology

## 2021-09-21 DIAGNOSIS — S8991XA Unspecified injury of right lower leg, initial encounter: Secondary | ICD-10-CM | POA: Diagnosis not present

## 2021-09-21 DIAGNOSIS — T148XXA Other injury of unspecified body region, initial encounter: Secondary | ICD-10-CM | POA: Diagnosis not present

## 2021-09-21 DIAGNOSIS — S81811A Laceration without foreign body, right lower leg, initial encounter: Secondary | ICD-10-CM | POA: Diagnosis not present

## 2021-09-22 DIAGNOSIS — S81811A Laceration without foreign body, right lower leg, initial encounter: Secondary | ICD-10-CM | POA: Diagnosis not present

## 2021-09-22 DIAGNOSIS — S8991XA Unspecified injury of right lower leg, initial encounter: Secondary | ICD-10-CM | POA: Diagnosis not present

## 2021-09-22 DIAGNOSIS — T148XXA Other injury of unspecified body region, initial encounter: Secondary | ICD-10-CM | POA: Diagnosis not present

## 2021-09-26 DIAGNOSIS — S8991XA Unspecified injury of right lower leg, initial encounter: Secondary | ICD-10-CM | POA: Diagnosis not present

## 2021-09-26 DIAGNOSIS — S81811A Laceration without foreign body, right lower leg, initial encounter: Secondary | ICD-10-CM | POA: Diagnosis not present

## 2021-09-26 DIAGNOSIS — T148XXA Other injury of unspecified body region, initial encounter: Secondary | ICD-10-CM | POA: Diagnosis not present

## 2021-09-29 DIAGNOSIS — S81811A Laceration without foreign body, right lower leg, initial encounter: Secondary | ICD-10-CM | POA: Diagnosis not present

## 2021-09-29 DIAGNOSIS — S8991XA Unspecified injury of right lower leg, initial encounter: Secondary | ICD-10-CM | POA: Diagnosis not present

## 2021-09-29 DIAGNOSIS — T148XXA Other injury of unspecified body region, initial encounter: Secondary | ICD-10-CM | POA: Diagnosis not present

## 2021-10-11 DIAGNOSIS — H61312 Acquired stenosis of left external ear canal secondary to trauma: Secondary | ICD-10-CM | POA: Diagnosis not present

## 2021-10-11 DIAGNOSIS — H6123 Impacted cerumen, bilateral: Secondary | ICD-10-CM | POA: Diagnosis not present

## 2021-11-15 ENCOUNTER — Other Ambulatory Visit (HOSPITAL_COMMUNITY): Payer: Self-pay | Admitting: Cardiology

## 2021-11-30 DIAGNOSIS — M25562 Pain in left knee: Secondary | ICD-10-CM | POA: Diagnosis not present

## 2021-12-12 DIAGNOSIS — H61302 Acquired stenosis of left external ear canal, unspecified: Secondary | ICD-10-CM | POA: Diagnosis not present

## 2021-12-12 DIAGNOSIS — H938X3 Other specified disorders of ear, bilateral: Secondary | ICD-10-CM | POA: Diagnosis not present

## 2022-02-01 DIAGNOSIS — G4733 Obstructive sleep apnea (adult) (pediatric): Secondary | ICD-10-CM | POA: Diagnosis not present

## 2022-02-06 ENCOUNTER — Other Ambulatory Visit (HOSPITAL_COMMUNITY): Payer: Self-pay | Admitting: Cardiology

## 2022-02-10 DIAGNOSIS — M722 Plantar fascial fibromatosis: Secondary | ICD-10-CM | POA: Diagnosis not present

## 2022-03-01 DIAGNOSIS — M79641 Pain in right hand: Secondary | ICD-10-CM | POA: Diagnosis not present

## 2022-03-01 DIAGNOSIS — M722 Plantar fascial fibromatosis: Secondary | ICD-10-CM | POA: Diagnosis not present

## 2022-03-01 DIAGNOSIS — M25512 Pain in left shoulder: Secondary | ICD-10-CM | POA: Diagnosis not present

## 2022-03-15 DIAGNOSIS — L89899 Pressure ulcer of other site, unspecified stage: Secondary | ICD-10-CM | POA: Diagnosis not present

## 2022-03-15 DIAGNOSIS — M722 Plantar fascial fibromatosis: Secondary | ICD-10-CM | POA: Diagnosis not present

## 2022-04-04 ENCOUNTER — Ambulatory Visit (HOSPITAL_COMMUNITY)
Admission: RE | Admit: 2022-04-04 | Discharge: 2022-04-04 | Disposition: A | Discharge status: Home / Self Care | Payer: Medicare Other | Source: Ambulatory Visit | Attending: Cardiology | Admitting: Cardiology

## 2022-04-04 ENCOUNTER — Encounter (HOSPITAL_COMMUNITY): Payer: Self-pay | Admitting: Cardiology

## 2022-04-04 VITALS — BP 110/50 | HR 55 | Wt 134.8 lb

## 2022-04-04 DIAGNOSIS — Z7902 Long term (current) use of antithrombotics/antiplatelets: Secondary | ICD-10-CM | POA: Insufficient documentation

## 2022-04-04 DIAGNOSIS — I5032 Chronic diastolic (congestive) heart failure: Secondary | ICD-10-CM | POA: Insufficient documentation

## 2022-04-04 DIAGNOSIS — G4733 Obstructive sleep apnea (adult) (pediatric): Secondary | ICD-10-CM | POA: Diagnosis not present

## 2022-04-04 DIAGNOSIS — E785 Hyperlipidemia, unspecified: Secondary | ICD-10-CM | POA: Diagnosis not present

## 2022-04-04 DIAGNOSIS — Z7982 Long term (current) use of aspirin: Secondary | ICD-10-CM | POA: Diagnosis not present

## 2022-04-04 DIAGNOSIS — I350 Nonrheumatic aortic (valve) stenosis: Secondary | ICD-10-CM | POA: Diagnosis not present

## 2022-04-04 DIAGNOSIS — Z79899 Other long term (current) drug therapy: Secondary | ICD-10-CM | POA: Insufficient documentation

## 2022-04-04 DIAGNOSIS — I359 Nonrheumatic aortic valve disorder, unspecified: Secondary | ICD-10-CM | POA: Diagnosis not present

## 2022-04-04 DIAGNOSIS — I2510 Atherosclerotic heart disease of native coronary artery without angina pectoris: Secondary | ICD-10-CM | POA: Diagnosis not present

## 2022-04-04 DIAGNOSIS — Z951 Presence of aortocoronary bypass graft: Secondary | ICD-10-CM | POA: Insufficient documentation

## 2022-04-04 DIAGNOSIS — I130 Hypertensive heart and chronic kidney disease with heart failure and stage 1 through stage 4 chronic kidney disease, or unspecified chronic kidney disease: Secondary | ICD-10-CM | POA: Diagnosis not present

## 2022-04-04 LAB — CBC
HCT: 31.9 % — ABNORMAL LOW (ref 39.0–52.0)
Hemoglobin: 10.3 g/dL — ABNORMAL LOW (ref 13.0–17.0)
MCH: 31.7 pg (ref 26.0–34.0)
MCHC: 32.3 g/dL (ref 30.0–36.0)
MCV: 98.2 fL (ref 80.0–100.0)
Platelets: 219 10*3/uL (ref 150–400)
RBC: 3.25 MIL/uL — ABNORMAL LOW (ref 4.22–5.81)
RDW: 13.7 % (ref 11.5–15.5)
WBC: 4.1 10*3/uL (ref 4.0–10.5)
nRBC: 0 % (ref 0.0–0.2)

## 2022-04-04 LAB — BASIC METABOLIC PANEL
Anion gap: 11 (ref 5–15)
BUN: 37 mg/dL — ABNORMAL HIGH (ref 8–23)
CO2: 20 mmol/L — ABNORMAL LOW (ref 22–32)
Calcium: 9.4 mg/dL (ref 8.9–10.3)
Chloride: 108 mmol/L (ref 98–111)
Creatinine, Ser: 1.47 mg/dL — ABNORMAL HIGH (ref 0.61–1.24)
GFR, Estimated: 45 mL/min — ABNORMAL LOW (ref >60)
Glucose, Bld: 105 mg/dL — ABNORMAL HIGH (ref 70–99)
Potassium: 5 mmol/L (ref 3.5–5.1)
Sodium: 139 mmol/L (ref 135–145)

## 2022-04-04 LAB — LIPID PANEL
Cholesterol: 141 mg/dL (ref 0–200)
HDL: 57 mg/dL (ref >40)
LDL Cholesterol: 75 mg/dL (ref 0–99)
Total CHOL/HDL Ratio: 2.5 RATIO
Triglycerides: 46 mg/dL (ref <150)
VLDL: 9 mg/dL (ref 0–40)

## 2022-04-04 LAB — BRAIN NATRIURETIC PEPTIDE: B Natriuretic Peptide: 157.4 pg/mL — ABNORMAL HIGH (ref 0.0–100.0)

## 2022-04-04 NOTE — Patient Instructions (Signed)
There has been no changes to your medications.  Labs done today, your results will be available in MyChart, we will contact you for abnormal readings.  Your physician has requested that you have an echocardiogram. Echocardiography is a painless test that uses sound waves to create images of your heart. It provides your doctor with information about the size and shape of your heart and how well your heart's chambers and valves are working. This procedure takes approximately one hour. There are no restrictions for this procedure. Please do NOT wear cologne, perfume, aftershave, or lotions (deodorant is allowed). Please arrive 15 minutes prior to your appointment time.  Your physician recommends that you schedule a follow-up appointment in: 4 months (June 2024)  ** please call the office in April to arrange your follow up appointment.**  If you have any questions or concerns before your next appointment please send [place] a message through [place] or call our office at [phone].    TO LEAVE A MESSAGE FOR THE NURSE SELECT OPTION 2, PLEASE LEAVE A MESSAGE INCLUDING: YOUR NAME DATE OF BIRTH CALL BACK NUMBER REASON FOR CALL**this is important as we prioritize the call backs  YOU WILL RECEIVE A CALL BACK THE SAME DAY AS LONG AS YOU CALL BEFORE 4:00 PM  At the [facility], you and your health needs are our priority. As part of our continuing mission to provide you with exceptional heart care, we have created designated Provider Care Teams. These Care Teams include your primary Cardiologist (physician) and Advanced Practice Providers (APPs- Physician Assistants and Nurse Practitioners) who all work together to provide you with the care you need, when you need it.   You may see any of the following providers on your designated Care Team at your next follow up: Dr Glori Bickers Dr Loralie Champagne Dr. Roxana Hires, NP Lyda Jester, [place] [facility] [place], [place] Forestine Na, NP Audry Riles, PharmD   Please be sure to bring in all your medications bottles to every appointment.    Thank you for choosing [facility]

## 2022-04-04 NOTE — Progress Notes (Signed)
Patient ID: NENO HOHENSEE, male   DOB: May 03, 1928, 89 y.o.   MRN: [id number] PCP: Dr. Nyoka Cowden Cardiology: Dr. Aundra Dubin  89 y.o.with history of CAD s/p CABG and mitral regurgitation s/p mitral valve repair presents for followup of CAD.   Echo in 5/20 showed EF 50-55% with inferior hypokinesis, mild AS, mild-moderate AI, s/p MV repair with stable mitral valve.    In 6/20, he had left TKR.  Cardiology was consulted while he was in the hospital for ?atrial fibrillation, but this was thought to actually be frequent PACs versus MAT.    At a prior appointment, he reported significant fatigue and dyspnea post-op. CTA chest showed no PE or other active disease.  Cardiolite was done in 7/20 showing EF 61%, small fixed basal anterior defect, no ischemia.  I had him increase Lasix to 40 mg daily due to concern for possible diastolic CHF.  He wore a 30 day monitor which showed short NSVT runs, no atrial fibrillation.  Sleep study showed severe OSA and he was started on CPAP.  Zio patch again in 12/20 showed 6.3% PVCs, Toprol XL was started.   Echo in 12/21 showed EF 50-55%, septal-lateral dyssynchrony, mild LVH, normal RV, s/p MV repair with trivial MR and probably mild mitral stenosis, moderate AI, mild AS mean gradient 12 mmHg and AVA 1.72 cm^2.   Echo in 1/23 showed EF 50-55%, mild RV enlargement with moderately decreased RV systolic function, s/p MV repair with mean gradient 4 mmHg and mild MR, moderate-severe TR, mild-moderate AI and AS with mean gradient 17 mmHg, IVC normal.   Patient is doing well in general.  He has some memory deficit but not severe.  He no longer jogs due to knee pain.  He does his own yardwork including blowing leaves and cutting up fallen limbs.  No significant exertional dyspnea or chest pain.  He walks on a treadmill for exercise.  He is using CPAP regularly.   ECG (personally reviewed): NSR, 1st degree AVB, iRBBB  Labs (2/14): K 4, creatinine 1.1 Labs (1/15): K 4, creatinine 1.1, LDL  64, HDL 57, pro-BNP 112 Labs (2/16): K 4.1, creatinine 1.03, LDL 66, HDL 53 Labs (5/17): LDL 57, HDL 71 Labs (7/17): K 4.2, creatinine 1.12 Labs (8/18): K 4.3, creatinine 1.07, LDL 58 Labs (1/20): LDL 66 Labs (6/20): K 3.6, creatinine 1.21, hgb 7.3 Labs (7/20): K 4.4, creatinine 1.3, BNP 165, hgb 7.5 => 8.2.  Labs (8/20): K 4, creatinine 1.4 Labs (12/20): LDL 75, creatinine 1.34 Labs (12/21): LDL 77, HDL 53, K 5, creatinine 1.5 Labs (8/22): K 4.2, creatinine 1.58 Labs (12/22): LDL 82, K 4.4, creatinine 1.32  PMH: 1. CAD: Acute MI in 2006 with ventricular fibrillation arrest.  Patient had 1st generation DES to LCx.  Patient then had CABG 2009 with LIMA-OM and SVG-PDA.  Plan has been to continue DAPT long-term because of 1st generation DES.  - Cardiolite (7/20): EF 61%, small fixed basal anterior defect, no ischemia. 2. Nephrolithiasis 3. HTN 4. Hyperlipidemia 5. Diverticulosis 6. Aortic stenosis: Mild on 7/20 echo. 7. Mitral valve prolapse with severe MR: MV repair in 2009.  Echo (2/14) with EF 50-55%, inferior HK, mild AS with mean gradient 12 mmHg, s/p MV repair with mild MR and no significant MS (mean gradient 3 mmHg), mild RV dilation, PA systolic pressure 37 mmHg.  Echo (3/16) with EF 50-55%, mild focal basal septal hypertrophy, mild aortic stenosis mean gradient 14 mmHg, s/p MV repair with mild MR and no MS,  PASP 38 mmHg.  - Echo (5/20): EF 50-55% with inferior hypokinesis, mild AS, mild-moderate AI, s/p MV repair with stable mitral valve. - Echo (12/21): EF 50-55%, septal-lateral dyssynchrony, mild LVH, nomral RV, s/p MV repair with trivial MR and probably mild mitral stenosis, moderate AI, mild AS mean gradient 12 mmHg and AVA 1.72 cm^2.  - Echo (1/23): EF 50-55%, mild RV enlargement with moderately decreased RV systolic function, s/p MV repair with mean gradient 4 mmHg and mild MR, moderate-severe TR, mild-moderate AI and AS with mean gradient 17 mmHg, IVC normal.  8. Thyroid  nodule: Biopsy with benign follicular nodule in 1610. 9. BPH  10. OSA: Uses CPAP.  11. Palpitations: 30 day monitor (8/20) showed short NSVT runs, no atrial fibrillation.  - 12/20 Zio patch: 6.3% PVCs 12. Aortic valve disorder: Echo in 1/23 with mild-moderate AS and AI.  13. CKD: Stage 3.   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   FH: HTN  ROS: All systems reviewed and negative except as per HPI.   Current Outpatient Medications  Medication Sig Dispense Refill   aspirin EC 81 MG tablet Take 1 tablet (81 mg total) by mouth daily. 90 tablet 3   clopidogrel (PLAVIX) 75 MG tablet TAKE ONE TABLET ONCE DAILY 30 tablet 11   ferrous sulfate (FERROUSUL) 325 (65 FE) MG tablet Take 1 tablet (325 mg total) by mouth 3 (three) times daily with meals.  3   furosemide (LASIX) 20 MG tablet TAKE ONE TABLET EVERY OTHER DAY 45 tablet 3   furosemide (LASIX) 40 MG tablet TAKE ONE TABLET EVERY OTHER DAY 126 tablet 2   lisinopril (ZESTRIL) 5 MG tablet TAKE ONE TABLET ONCE DAILY 90 tablet 3   metoprolol succinate (TOPROL-XL) 25 MG 24 hr tablet TAKE 1/2 TABLET BY MOUTH EVERY DAY 45 tablet 3   Multiple Vitamin (MULTIVITAMIN) tablet Take 1 tablet by mouth daily.       potassium chloride SA (KLOR-CON M) 20 MEQ tablet TAKE ONE TABLET BY MOUTH DAILY 90 tablet 3   simvastatin (ZOCOR) 40 MG tablet TAKE ONE TABLET BY MOUTH ONCE DAILY 30 tablet 11   Tamsulosin HCl (FLOMAX) 0.4 MG CAPS Take 0.4 mg by mouth daily.      No current facility-administered medications for this encounter.    BP (!) 110/50   Pulse (!) 55   Wt 61.1 kg (134 lb 12.8 oz)   SpO2 95%   BMI 20.50 kg/m  General: NAD Neck: No JVD, no thyromegaly or thyroid nodule.  Lungs: Clear to auscultation bilaterally with normal respiratory effort. CV: Nondisplaced PMI.  Heart regular S1/S2, no S3/S4, 3/6 SEM RUSB with clear S2.  No peripheral edema.  No carotid bruit.  Normal pedal pulses.  Abdomen: Soft, nontender, no  hepatosplenomegaly, no distention.  Skin: Intact without lesions or rashes.  Neurologic: Alert and oriented x 3.  Psych: Normal affect. Extremities: No clubbing or cyanosis.  HEENT: Normal.   Assessment/Plan: 1. CAD: S/p 1st generation DES to LCx and later CABG. Cardiolite was done in 7/20, this showed no ischemia.  Currently, no significant exertional symptoms.  - Patient has been on ASA and Plavix long-term.  Based on current data, he could stop ASA and continue Plavix, but he prefers to continue both.  - Continue statin, check lipids today.  2. S/p MV repair: Echo in 1/23 showed stable MV repair with mean gradient 4 mmHg and mild MR.  - I will arrange for repeat echo  3. Aortic valve disorder: Echo in 1/23 with mild-moderate AS and AI.  He would be a candidate for TAVR if this worsens.  - I will arrange for repeat echo.   4. Hyperlipidemia: Continue statin.  Check lipids today.   5. HTN: BP controlled.  6. Chronic diastolic CHF: Echo in 6/64 with EF 50-55%, moderate RV dysfunction.  He is not volume overloaded on exam.  - Continue Lasix 40 daily alternating with 20 daily.  BMET/BNP today.   7. PVCs: Now on a beta blocker with no palpitations.  8. OSA: Continue CPAP.   9. Bradycardia: HR stable in 50s.  Can continue current Toprol XL.     Followup 4 months with APP.   Loralie Champagne 04/04/2022

## 2022-04-14 DIAGNOSIS — M722 Plantar fascial fibromatosis: Secondary | ICD-10-CM | POA: Diagnosis not present

## 2022-04-14 DIAGNOSIS — M79602 Pain in left arm: Secondary | ICD-10-CM | POA: Diagnosis not present

## 2022-04-14 DIAGNOSIS — L89899 Pressure ulcer of other site, unspecified stage: Secondary | ICD-10-CM | POA: Diagnosis not present

## 2022-04-19 DIAGNOSIS — M79641 Pain in right hand: Secondary | ICD-10-CM | POA: Diagnosis not present

## 2022-04-19 DIAGNOSIS — M25512 Pain in left shoulder: Secondary | ICD-10-CM | POA: Diagnosis not present

## 2022-04-26 ENCOUNTER — Ambulatory Visit (HOSPITAL_COMMUNITY)
Admission: RE | Admit: 2022-04-26 | Discharge: 2022-04-26 | Disposition: A | Discharge status: Home / Self Care | Payer: Medicare Other | Source: Ambulatory Visit | Attending: Cardiology | Admitting: Cardiology

## 2022-04-26 DIAGNOSIS — I083 Combined rheumatic disorders of mitral, aortic and tricuspid valves: Secondary | ICD-10-CM | POA: Insufficient documentation

## 2022-04-26 DIAGNOSIS — I5032 Chronic diastolic (congestive) heart failure: Secondary | ICD-10-CM

## 2022-04-26 DIAGNOSIS — I110 Hypertensive heart disease with heart failure: Secondary | ICD-10-CM | POA: Insufficient documentation

## 2022-04-26 DIAGNOSIS — E785 Hyperlipidemia, unspecified: Secondary | ICD-10-CM | POA: Diagnosis not present

## 2022-04-26 LAB — ECHOCARDIOGRAM COMPLETE
AR max vel: 1.68 cm2
AV Area VTI: 1.67 cm2
AV Area mean vel: 1.54 cm2
AV Mean grad: 13.3 mmHg
AV Peak grad: 22.4 mmHg
Ao pk vel: 2.37 m/s
Area-P 1/2: 2.39 cm2
Calc EF: 41.5 %
MV VTI: 1.5 cm2
P 1/2 time: 965 msec
S' Lateral: 4.2 cm
Single Plane A2C EF: 42.9 %
Single Plane A4C EF: 46.9 %

## 2022-04-27 ENCOUNTER — Telehealth (HOSPITAL_COMMUNITY): Payer: Self-pay

## 2022-04-27 NOTE — Telephone Encounter (Signed)
Patient aware of Echo results.

## 2022-04-28 DIAGNOSIS — M25512 Pain in left shoulder: Secondary | ICD-10-CM | POA: Diagnosis not present

## 2022-04-28 DIAGNOSIS — M25511 Pain in right shoulder: Secondary | ICD-10-CM | POA: Diagnosis not present

## 2022-04-28 DIAGNOSIS — M79641 Pain in right hand: Secondary | ICD-10-CM | POA: Diagnosis not present

## 2022-04-30 ENCOUNTER — Other Ambulatory Visit (HOSPITAL_COMMUNITY): Payer: Self-pay | Admitting: Cardiology

## 2022-05-16 DIAGNOSIS — H52203 Unspecified astigmatism, bilateral: Secondary | ICD-10-CM | POA: Diagnosis not present

## 2022-05-22 DIAGNOSIS — M19011 Primary osteoarthritis, right shoulder: Secondary | ICD-10-CM | POA: Diagnosis not present

## 2022-05-22 DIAGNOSIS — R351 Nocturia: Secondary | ICD-10-CM | POA: Diagnosis not present

## 2022-05-22 DIAGNOSIS — N401 Enlarged prostate with lower urinary tract symptoms: Secondary | ICD-10-CM | POA: Diagnosis not present

## 2022-06-06 DIAGNOSIS — M25511 Pain in right shoulder: Secondary | ICD-10-CM | POA: Diagnosis not present

## 2022-06-22 DIAGNOSIS — M19011 Primary osteoarthritis, right shoulder: Secondary | ICD-10-CM | POA: Diagnosis not present

## 2022-06-23 ENCOUNTER — Telehealth: Payer: Self-pay | Admitting: *Deleted

## 2022-06-23 NOTE — Telephone Encounter (Signed)
   Pre-operative Risk Assessment    Patient Name: [name] [name]  DOB: 20-Apr-1928 MRN: [id number]      Request for Surgical Clearance    Procedure:   Right shoulder reverse arthroplasty.  Date of Surgery:  Clearance TBD                                 Surgeon:  Dr. Duwayne Heck Surgeon's Group or Practice Name:  [facility] Phone number:  [phone] Fax number:  [phone]   Type of Clearance Requested:   - Medical  - Pharmacy:  Hold Aspirin and Clopidogrel (Plavix) Not Indicated   Type of Anesthesia:   Choice   Additional requests/questions:    Signed, Emmit Pomfret   06/23/2022, 12:08 PM

## 2022-06-23 NOTE — Telephone Encounter (Signed)
   Patient Name: [name] [name]  DOB: Nov 29, 1928 MRN: [id number]  Primary Cardiologist: None  Chart reviewed as part of pre-operative protocol coverage. Per Dr. Shirlee Latch, primary cardiologist, [name] is at acceptable risk for the planned procedure without further cardiovascular testing.   Per Dr. Shirlee Latch, pt may hold Plavix for 5 days prior to surgery. Please resume Plavix as soon as possible postprocedure, at the discretion of the surgeon.   I will route this recommendation to the requesting party via Epic fax function and remove from pre-op pool.  Please call with questions.  Joylene Grapes, NP 06/23/2022, 3:59 PM

## 2022-06-28 DIAGNOSIS — M25571 Pain in right ankle and joints of right foot: Secondary | ICD-10-CM | POA: Diagnosis not present

## 2022-07-05 DIAGNOSIS — G4733 Obstructive sleep apnea (adult) (pediatric): Secondary | ICD-10-CM | POA: Diagnosis not present

## 2022-07-19 DIAGNOSIS — M25571 Pain in right ankle and joints of right foot: Secondary | ICD-10-CM | POA: Diagnosis not present

## 2022-07-22 ENCOUNTER — Other Ambulatory Visit (HOSPITAL_COMMUNITY): Payer: Self-pay | Admitting: Cardiology

## 2022-08-08 DIAGNOSIS — N1831 Chronic kidney disease, stage 3a: Secondary | ICD-10-CM | POA: Diagnosis not present

## 2022-08-08 DIAGNOSIS — I130 Hypertensive heart and chronic kidney disease with heart failure and stage 1 through stage 4 chronic kidney disease, or unspecified chronic kidney disease: Secondary | ICD-10-CM | POA: Diagnosis not present

## 2022-08-08 DIAGNOSIS — E261 Secondary hyperaldosteronism: Secondary | ICD-10-CM | POA: Diagnosis not present

## 2022-08-08 DIAGNOSIS — I5042 Chronic combined systolic (congestive) and diastolic (congestive) heart failure: Secondary | ICD-10-CM | POA: Diagnosis not present

## 2022-08-09 DIAGNOSIS — M25571 Pain in right ankle and joints of right foot: Secondary | ICD-10-CM | POA: Diagnosis not present

## 2022-09-30 DIAGNOSIS — M79671 Pain in right foot: Secondary | ICD-10-CM | POA: Diagnosis not present

## 2022-10-13 ENCOUNTER — Other Ambulatory Visit (HOSPITAL_COMMUNITY): Payer: Self-pay | Admitting: Cardiology

## 2022-11-10 DIAGNOSIS — M25562 Pain in left knee: Secondary | ICD-10-CM | POA: Diagnosis not present

## 2022-11-16 DIAGNOSIS — M25562 Pain in left knee: Secondary | ICD-10-CM | POA: Diagnosis not present

## 2022-11-16 DIAGNOSIS — R0781 Pleurodynia: Secondary | ICD-10-CM | POA: Diagnosis not present

## 2022-11-21 DIAGNOSIS — G3184 Mild cognitive impairment, so stated: Secondary | ICD-10-CM | POA: Diagnosis not present

## 2022-11-21 DIAGNOSIS — Z1339 Encounter for screening examination for other mental health and behavioral disorders: Secondary | ICD-10-CM | POA: Diagnosis not present

## 2022-11-21 DIAGNOSIS — Z1331 Encounter for screening for depression: Secondary | ICD-10-CM | POA: Diagnosis not present

## 2022-11-21 DIAGNOSIS — Z125 Encounter for screening for malignant neoplasm of prostate: Secondary | ICD-10-CM | POA: Diagnosis not present

## 2022-11-21 DIAGNOSIS — E785 Hyperlipidemia, unspecified: Secondary | ICD-10-CM | POA: Diagnosis not present

## 2022-11-21 DIAGNOSIS — R739 Hyperglycemia, unspecified: Secondary | ICD-10-CM | POA: Diagnosis not present

## 2022-11-21 DIAGNOSIS — Z0000 Encounter for general adult medical examination without abnormal findings: Secondary | ICD-10-CM | POA: Diagnosis not present

## 2022-11-21 DIAGNOSIS — D649 Anemia, unspecified: Secondary | ICD-10-CM | POA: Diagnosis not present

## 2022-11-21 DIAGNOSIS — I130 Hypertensive heart and chronic kidney disease with heart failure and stage 1 through stage 4 chronic kidney disease, or unspecified chronic kidney disease: Secondary | ICD-10-CM | POA: Diagnosis not present

## 2022-12-19 DIAGNOSIS — I5042 Chronic combined systolic (congestive) and diastolic (congestive) heart failure: Secondary | ICD-10-CM | POA: Diagnosis not present

## 2023-01-04 DIAGNOSIS — G4733 Obstructive sleep apnea (adult) (pediatric): Secondary | ICD-10-CM | POA: Diagnosis not present

## 2023-01-05 ENCOUNTER — Other Ambulatory Visit (HOSPITAL_COMMUNITY): Payer: Self-pay | Admitting: Cardiology

## 2023-01-18 DIAGNOSIS — H6123 Impacted cerumen, bilateral: Secondary | ICD-10-CM | POA: Diagnosis not present

## 2023-02-07 DIAGNOSIS — S8002XA Contusion of left knee, initial encounter: Secondary | ICD-10-CM | POA: Diagnosis not present

## 2023-02-07 DIAGNOSIS — Z96652 Presence of left artificial knee joint: Secondary | ICD-10-CM | POA: Diagnosis not present

## 2023-03-13 DIAGNOSIS — L821 Other seborrheic keratosis: Secondary | ICD-10-CM | POA: Diagnosis not present

## 2023-03-13 DIAGNOSIS — Z85828 Personal history of other malignant neoplasm of skin: Secondary | ICD-10-CM | POA: Diagnosis not present

## 2023-03-13 DIAGNOSIS — D692 Other nonthrombocytopenic purpura: Secondary | ICD-10-CM | POA: Diagnosis not present

## 2023-03-13 DIAGNOSIS — L111 Transient acantholytic dermatosis [Grover]: Secondary | ICD-10-CM | POA: Diagnosis not present

## 2023-03-20 DIAGNOSIS — H6123 Impacted cerumen, bilateral: Secondary | ICD-10-CM | POA: Diagnosis not present

## 2023-03-20 DIAGNOSIS — H61312 Acquired stenosis of left external ear canal secondary to trauma: Secondary | ICD-10-CM | POA: Diagnosis not present

## 2023-03-27 DIAGNOSIS — I110 Hypertensive heart disease with heart failure: Secondary | ICD-10-CM | POA: Diagnosis not present

## 2023-03-30 ENCOUNTER — Other Ambulatory Visit (HOSPITAL_COMMUNITY): Payer: Self-pay | Admitting: Cardiology

## 2023-04-18 ENCOUNTER — Encounter (HOSPITAL_COMMUNITY): Payer: Medicare Other | Admitting: Cardiology

## 2023-04-20 ENCOUNTER — Ambulatory Visit (HOSPITAL_COMMUNITY)
Admission: RE | Admit: 2023-04-20 | Discharge: 2023-04-20 | Disposition: A | Discharge status: Home / Self Care | Payer: Medicare Other | Source: Ambulatory Visit | Attending: Cardiology | Admitting: Cardiology

## 2023-04-20 ENCOUNTER — Encounter (HOSPITAL_COMMUNITY): Payer: Self-pay | Admitting: Cardiology

## 2023-04-20 VITALS — BP 144/77 | HR 50 | Wt 136.8 lb

## 2023-04-20 DIAGNOSIS — E785 Hyperlipidemia, unspecified: Secondary | ICD-10-CM | POA: Insufficient documentation

## 2023-04-20 DIAGNOSIS — I110 Hypertensive heart disease with heart failure: Secondary | ICD-10-CM | POA: Insufficient documentation

## 2023-04-20 DIAGNOSIS — I2510 Atherosclerotic heart disease of native coronary artery without angina pectoris: Secondary | ICD-10-CM | POA: Diagnosis not present

## 2023-04-20 DIAGNOSIS — Z951 Presence of aortocoronary bypass graft: Secondary | ICD-10-CM | POA: Diagnosis not present

## 2023-04-20 DIAGNOSIS — I350 Nonrheumatic aortic (valve) stenosis: Secondary | ICD-10-CM

## 2023-04-20 DIAGNOSIS — I491 Atrial premature depolarization: Secondary | ICD-10-CM | POA: Insufficient documentation

## 2023-04-20 DIAGNOSIS — Z8249 Family history of ischemic heart disease and other diseases of the circulatory system: Secondary | ICD-10-CM | POA: Diagnosis not present

## 2023-04-20 DIAGNOSIS — R001 Bradycardia, unspecified: Secondary | ICD-10-CM | POA: Insufficient documentation

## 2023-04-20 DIAGNOSIS — Z7982 Long term (current) use of aspirin: Secondary | ICD-10-CM | POA: Insufficient documentation

## 2023-04-20 DIAGNOSIS — I4510 Unspecified right bundle-branch block: Secondary | ICD-10-CM | POA: Diagnosis not present

## 2023-04-20 DIAGNOSIS — Z79899 Other long term (current) drug therapy: Secondary | ICD-10-CM | POA: Diagnosis not present

## 2023-04-20 DIAGNOSIS — I5032 Chronic diastolic (congestive) heart failure: Secondary | ICD-10-CM | POA: Insufficient documentation

## 2023-04-20 DIAGNOSIS — Z955 Presence of coronary angioplasty implant and graft: Secondary | ICD-10-CM | POA: Insufficient documentation

## 2023-04-20 DIAGNOSIS — I083 Combined rheumatic disorders of mitral, aortic and tricuspid valves: Secondary | ICD-10-CM | POA: Diagnosis not present

## 2023-04-20 DIAGNOSIS — G4733 Obstructive sleep apnea (adult) (pediatric): Secondary | ICD-10-CM | POA: Diagnosis not present

## 2023-04-20 DIAGNOSIS — Z952 Presence of prosthetic heart valve: Secondary | ICD-10-CM | POA: Diagnosis not present

## 2023-04-20 DIAGNOSIS — E7800 Pure hypercholesterolemia, unspecified: Secondary | ICD-10-CM

## 2023-04-20 DIAGNOSIS — Z7902 Long term (current) use of antithrombotics/antiplatelets: Secondary | ICD-10-CM | POA: Diagnosis not present

## 2023-04-20 DIAGNOSIS — I493 Ventricular premature depolarization: Secondary | ICD-10-CM | POA: Insufficient documentation

## 2023-04-20 LAB — BASIC METABOLIC PANEL
Anion gap: 7 (ref 5–15)
BUN: 42 mg/dL — ABNORMAL HIGH (ref 8–23)
CO2: 21 mmol/L — ABNORMAL LOW (ref 22–32)
Calcium: 9.4 mg/dL (ref 8.9–10.3)
Chloride: 111 mmol/L (ref 98–111)
Creatinine, Ser: 1.89 mg/dL — ABNORMAL HIGH (ref 0.61–1.24)
GFR, Estimated: 33 mL/min — ABNORMAL LOW (ref >60)
Glucose, Bld: 117 mg/dL — ABNORMAL HIGH (ref 70–99)
Potassium: 4.9 mmol/L (ref 3.5–5.1)
Sodium: 139 mmol/L (ref 135–145)

## 2023-04-20 LAB — BRAIN NATRIURETIC PEPTIDE: B Natriuretic Peptide: 252.6 pg/mL — ABNORMAL HIGH (ref 0.0–100.0)

## 2023-04-20 LAB — LIPID PANEL
Cholesterol: 122 mg/dL (ref 0–200)
HDL: 55 mg/dL (ref >40)
LDL Cholesterol: 53 mg/dL (ref 0–99)
Total CHOL/HDL Ratio: 2.2 {ratio}
Triglycerides: 69 mg/dL (ref <150)
VLDL: 14 mg/dL (ref 0–40)

## 2023-04-20 NOTE — Patient Instructions (Addendum)
EKG done today.  Labs done today. We will contact you only if your labs are abnormal.  No medication changes were made. Please continue all current medications as prescribed.  Your physician recommends that you schedule a follow-up appointment soon for an echo and in 6 months with Dr. Shirlee Latch. Please contact our office in May to schedule a June appointment.   Your physician has requested that you have an echocardiogram. Echocardiography is a painless test that uses sound waves to create images of your heart. It provides your doctor with information about the size and shape of your heart and how well your heart's chambers and valves are working. This procedure takes approximately one hour. There are no restrictions for this procedure. Please do NOT wear cologne, perfume, aftershave, or lotions (deodorant is allowed). Please arrive 15 minutes prior to your appointment time.  Please note: We ask at that you not bring children with you during ultrasound (echo/ vascular) testing. Due to room size and safety concerns, children are not allowed in the ultrasound rooms during exams. Our front office staff cannot provide observation of children in our lobby area while testing is being conducted. An adult accompanying a patient to their appointment will only be allowed in the ultrasound room at the discretion of the ultrasound technician under special circumstances. We apologize for any inconvenience.   If you have any questions or concerns before your next appointment please send [place] a message through [place] or call our office at [phone].    TO LEAVE A MESSAGE FOR THE NURSE SELECT OPTION 2, PLEASE LEAVE A MESSAGE INCLUDING: YOUR NAME DATE OF BIRTH CALL BACK NUMBER REASON FOR CALL**this is important as we prioritize the call backs  YOU WILL RECEIVE A CALL BACK THE SAME DAY AS LONG AS YOU CALL BEFORE 4:00 PM   Do the following things EVERYDAY: Weigh yourself in the morning before breakfast. Write  it down and keep it in a log. Take your medicines as prescribed Eat low salt foods--Limit salt (sodium) to 2000 mg per day.  Stay as active as you can everyday Limit all fluids for the day to less than 2 liters   At the [facility], you and your health needs are our priority. As part of our continuing mission to provide you with exceptional heart care, we have created designated Provider Care Teams. These Care Teams include your primary Cardiologist (physician) and Advanced Practice Providers (APPs- Physician Assistants and Nurse Practitioners) who all work together to provide you with the care you need, when you need it.   You may see any of the following providers on your designated Care Team at your next follow up: Dr Arvilla Meres Dr Marca Ancona Dr. Marcos Eke, NP Robbie Lis, [place] [facility] [place], [place] Brynda Peon, NP Karle Plumber, PharmD   Please be sure to bring in all your medications bottles to every appointment.    Thank you for choosing [facility]

## 2023-04-22 NOTE — Progress Notes (Signed)
 Patient ID: [name] [name], male   DOB: 1928-08-09, 89 y.o.   MRN: [id number] PCP: Dr. Chilton Si Cardiology: Dr. Shirlee Latch  Chief complaint: CHF  89 y.o.with history of CAD s/p CABG and mitral regurgitation s/p mitral valve repair presents for followup of CAD.   Echo in 5/20 showed EF 50-55% with inferior hypokinesis, mild AS, mild-moderate AI, s/p MV repair with stable mitral valve.    In 6/20, he had left TKR.  Cardiology was consulted while he was in the hospital for ?atrial fibrillation, but this was thought to actually be frequent PACs versus MAT.    At a prior appointment, he reported significant fatigue and dyspnea post-op. CTA chest showed no PE or other active disease.  Cardiolite was done in 7/20 showing EF 61%, small fixed basal anterior defect, no ischemia.  I had him increase Lasix to 40 mg daily due to concern for possible diastolic CHF.  He wore a 30 day monitor which showed short NSVT runs, no atrial fibrillation.  Sleep study showed severe OSA and he was started on CPAP.  Zio patch again in 12/20 showed 6.3% PVCs, Toprol XL was started.   Echo in 12/21 showed EF 50-55%, septal-lateral dyssynchrony, mild LVH, normal RV, s/p MV repair with trivial MR and probably mild mitral stenosis, moderate AI, mild AS mean gradient 12 mmHg and AVA 1.72 cm^2.   Echo in 1/23 showed EF 50-55%, mild RV enlargement with moderately decreased RV systolic function, s/p MV repair with mean gradient 4 mmHg and mild MR, moderate-severe TR, mild-moderate AI and AS with mean gradient 17 mmHg, IVC normal.   Echo in 2/24 showed EF 45-50%, s/p MV repair with mild MR and no mitral stenosis, mild AS mean gradient 13 mmHg with AVA 1.67 cm^2, severe TR, mild RV enlargement with mildly decreased RV systolic function.   Patient is doing well in general.  He has some memory deficit but not severe.  Conversation is tangential.  He lives at home, wife is in a SNF.  He is still doing all his yardwork (rakes, bags leaves, etc).  No  exertional dyspnea or chest pain. He injured his left shoulder in a fall and has lost range of motion.  No chest pain. Weight up 2 lbs.  No palpitations, lightheadedness, orthopnea/PND.     ECG (personally reviewed): NSR, 1st degree AVB, iRBBB  Labs (2/14): K 4, creatinine 1.1 Labs (1/15): K 4, creatinine 1.1, LDL 64, HDL 57, pro-BNP 112 Labs (4/09): K 4.1, creatinine 1.03, LDL 66, HDL 53 Labs (5/17): LDL 57, HDL 71 Labs (7/17): K 4.2, creatinine 1.12 Labs (8/18): K 4.3, creatinine 1.07, LDL 58 Labs (1/20): LDL 66 Labs (6/20): K 3.6, creatinine 1.21, hgb 7.3 Labs (7/20): K 4.4, creatinine 1.3, BNP 165, hgb 7.5 => 8.2.  Labs (8/20): K 4, creatinine 1.4 Labs (12/20): LDL 75, creatinine 1.34 Labs (12/21): LDL 77, HDL 53, K 5, creatinine 1.5 Labs (8/22): K 4.2, creatinine 1.58 Labs (12/22): LDL 82, K 4.4, creatinine 8.11 Labs (2/24): hgb 10.3, BNP 157, K 5, creatinine 1.47  PMH: 1. CAD: Acute MI in 2006 with ventricular fibrillation arrest.  Patient had 1st generation DES to LCx.  Patient then had CABG 2009 with LIMA-OM and SVG-PDA.  Plan has been to continue DAPT long-term because of 1st generation DES.  - Cardiolite (7/20): EF 61%, small fixed basal anterior defect, no ischemia. 2. Nephrolithiasis 3. HTN 4. Hyperlipidemia 5. Diverticulosis 6. Aortic stenosis: Mild on 7/20 echo. 7. Mitral valve prolapse with  severe MR: MV repair in 2009.  Echo (2/14) with EF 50-55%, inferior HK, mild AS with mean gradient 12 mmHg, s/p MV repair with mild MR and no significant MS (mean gradient 3 mmHg), mild RV dilation, PA systolic pressure 37 mmHg.  Echo (3/16) with EF 50-55%, mild focal basal septal hypertrophy, mild aortic stenosis mean gradient 14 mmHg, s/p MV repair with mild MR and no MS, PASP 38 mmHg.  - Echo (5/20): EF 50-55% with inferior hypokinesis, mild AS, mild-moderate AI, s/p MV repair with stable mitral valve. - Echo (12/21): EF 50-55%, septal-lateral dyssynchrony, mild LVH, nomral RV, s/p  MV repair with trivial MR and probably mild mitral stenosis, moderate AI, mild AS mean gradient 12 mmHg and AVA 1.72 cm^2.  - Echo (1/23): EF 50-55%, mild RV enlargement with moderately decreased RV systolic function, s/p MV repair with mean gradient 4 mmHg and mild MR, moderate-severe TR, mild-moderate AI and AS with mean gradient 17 mmHg, IVC normal.  - Echo (2/24): EF 45-50%, s/p MV repair with mild MR and no mitral stenosis, mild AS mean gradient 13 mmHg with AVA 1.67 cm^2, severe TR, mild RV enlargement with mildly decreased RV systolic function.  8. Thyroid nodule: Biopsy with benign follicular nodule in 2018. 9. BPH  10. OSA: Uses CPAP.  11. Palpitations: 30 day monitor (8/20) showed short NSVT runs, no atrial fibrillation.  - 12/20 Zio patch: 6.3% PVCs 12. Aortic valve disorder: 2/24 with mild aortic stenosis.   13. CKD: Stage 3.  14. Tricuspid regurgitation: Severe on 2/24 echo.   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   FH: HTN  ROS: All systems reviewed and negative except as per HPI.   Current Outpatient Medications  Medication Sig Dispense Refill   aspirin EC 81 MG tablet Take 1 tablet (81 mg total) by mouth daily. 90 tablet 3   clopidogrel (PLAVIX) 75 MG tablet TAKE ONE TABLET ONCE DAILY 30 tablet 11   ferrous sulfate (FERROUSUL) 325 (65 FE) MG tablet Take 1 tablet (325 mg total) by mouth 3 (three) times daily with meals.  3   furosemide (LASIX) 20 MG tablet TAKE ONE TABLET EVERY OTHER DAY 45 tablet 3   furosemide (LASIX) 40 MG tablet Take 1 tablet (40 mg total) by mouth every other day. NEEDS FOLLOW UP APPOINTMENT FOR MORE REFILL S 126 tablet 0   lisinopril (ZESTRIL) 5 MG tablet Take 1 tablet (5 mg total) by mouth daily. NEEDS FOLLOW UP APPOINTMENT FOR MORE REFILLS 90 tablet 3   metoprolol succinate (TOPROL-XL) 25 MG 24 hr tablet TAKE 1/2 TABLET BY MOUTH EVERY DAY 45 tablet 3   Multiple Vitamin (MULTIVITAMIN) tablet Take 1 tablet by  mouth daily.       potassium chloride SA (KLOR-CON M) 20 MEQ tablet Take 1 tablet (20 mEq total) by mouth daily. NEEDS FOLLOW UP APPOINTMENT FOR MORE REFILLS 90 tablet 3   simvastatin (ZOCOR) 40 MG tablet Take 1 tablet (40 mg total) by mouth daily. NEEDS FOLLOW UP APPOINTMENT FOR MORE REFILLS 90 tablet 3   Tamsulosin HCl (FLOMAX) 0.4 MG CAPS Take 0.4 mg by mouth daily.      No current facility-administered medications for this encounter.    BP (!) 144/77   Pulse (!) 50   Wt 62.1 kg (136 lb 12.8 oz)   SpO2 98%   BMI 20.80 kg/m  General: NAD Neck: No JVD, no thyromegaly or thyroid nodule.  Lungs: Clear to auscultation bilaterally  with normal respiratory effort. CV: Nondisplaced PMI.  Heart regular S1/S2, no S3/S4, 3/6 HSM LLSB, 3/6 SEM RUSB.  1+ ankle edema.  No carotid bruit.  Normal pedal pulses.  Abdomen: Soft, nontender, no hepatosplenomegaly, no distention.  Skin: Intact without lesions or rashes.  Neurologic: Alert and oriented x 3.  Psych: Normal affect. Extremities: No clubbing or cyanosis.  HEENT: Normal.   Assessment/Plan: 1. CAD: S/p 1st generation DES to LCx and later CABG. Cardiolite was done in 7/20, this showed no ischemia.  No chest pain.  - Patient has been on ASA and Plavix long-term.  Based on current data, he could stop ASA and continue Plavix, but he prefers to continue both.  - Continue statin, check lipids today.  2. S/p MV repair: Echo in 2/24 showed stable MV repair with mean gradient 2 mmHg and mild MR.  - I will arrange for repeat echo.  3. Aortic valve disorder: Echo in 2/24 with mild AS.   - I will arrange for repeat echo.   4. Hyperlipidemia: Continue statin.  Check lipids today.   5. HTN: BP mildly elevated today, no changes.  6. Chronic HF with mid range EF: Echo in 2/24 with EF 45-50%, s/p MV repair with mild MR and no mitral stenosis, mild AS mean gradient 13 mmHg with AVA 1.67 cm^2, severe TR, mild RV enlargement with mildly decreased RV systolic  function.   He is not volume overloaded on exam. NYHA class II symptoms.  Still very active with yardwork, main limitations are orthopedic.  - Continue Lasix 40 daily alternating with 20 daily.  BMET/BNP today.   - Continue Toprol XL 12.5 mg daily, would not increase with HR 50.  - Continue lisinopril 5 mg daily.  7. PVCs: Now on a beta blocker with no palpitations.  8. OSA: Continue CPAP.   9. Bradycardia: HR stable in 50s.  Can continue current Toprol XL.    10. Tricuspid regurgitation: Severe on 2/24 echo but minimal symptoms.   - Repeat echo.  - Would manage conservatively.   Followup 6 months with APP.   I spent 32 minutes reviewing data, interviewing patient, and organizing the orders/followup.   Marca Ancona 04/22/2023

## 2023-04-23 ENCOUNTER — Encounter (HOSPITAL_COMMUNITY): Payer: Self-pay

## 2023-05-04 DIAGNOSIS — R351 Nocturia: Secondary | ICD-10-CM | POA: Diagnosis not present

## 2023-05-04 DIAGNOSIS — N401 Enlarged prostate with lower urinary tract symptoms: Secondary | ICD-10-CM | POA: Diagnosis not present

## 2023-05-08 ENCOUNTER — Ambulatory Visit (HOSPITAL_COMMUNITY)
Admission: RE | Admit: 2023-05-08 | Discharge: 2023-05-08 | Disposition: A | Discharge status: Home / Self Care | Payer: Medicare Other | Source: Ambulatory Visit | Attending: Cardiology | Admitting: Cardiology

## 2023-05-08 ENCOUNTER — Telehealth (HOSPITAL_COMMUNITY): Payer: Self-pay | Admitting: Cardiology

## 2023-05-08 DIAGNOSIS — I083 Combined rheumatic disorders of mitral, aortic and tricuspid valves: Secondary | ICD-10-CM | POA: Diagnosis not present

## 2023-05-08 DIAGNOSIS — I350 Nonrheumatic aortic (valve) stenosis: Secondary | ICD-10-CM | POA: Insufficient documentation

## 2023-05-08 DIAGNOSIS — I5032 Chronic diastolic (congestive) heart failure: Secondary | ICD-10-CM

## 2023-05-08 LAB — ECHOCARDIOGRAM COMPLETE
AR max vel: 1.32 cm2
AV Area VTI: 1.23 cm2
AV Area mean vel: 1.23 cm2
AV Mean grad: 21 mmHg
AV Peak grad: 38.2 mmHg
Ao pk vel: 3.09 m/s
Area-P 1/2: 1.51 cm2
MV VTI: 1.29 cm2
S' Lateral: 2.8 cm

## 2023-05-08 MED ORDER — FUROSEMIDE 20 MG PO TABS: 20.0000 mg | ORAL_TABLET | ORAL | 3 refills | Status: DC

## 2023-05-08 NOTE — Telephone Encounter (Signed)
-----   Message from Marca Ancona sent at 04/22/2023  2:24 PM EST ----- Good lipids.  Creatinine higher, decrease Lasix to 20 mg daily with BMET in 10 days.  Keep sodium low in diet.

## 2023-05-08 NOTE — Telephone Encounter (Signed)
Pt aware.

## 2023-05-16 ENCOUNTER — Ambulatory Visit (HOSPITAL_COMMUNITY)
Admission: RE | Admit: 2023-05-16 | Discharge: 2023-05-16 | Disposition: A | Discharge status: Home / Self Care | Source: Ambulatory Visit | Attending: Cardiology | Admitting: Cardiology

## 2023-05-16 DIAGNOSIS — I5032 Chronic diastolic (congestive) heart failure: Secondary | ICD-10-CM | POA: Insufficient documentation

## 2023-05-16 LAB — BASIC METABOLIC PANEL
Anion gap: 8 (ref 5–15)
BUN: 44 mg/dL — ABNORMAL HIGH (ref 8–23)
CO2: 18 mmol/L — ABNORMAL LOW (ref 22–32)
Calcium: 9.1 mg/dL (ref 8.9–10.3)
Chloride: 113 mmol/L — ABNORMAL HIGH (ref 98–111)
Creatinine, Ser: 1.61 mg/dL — ABNORMAL HIGH (ref 0.61–1.24)
GFR, Estimated: 40 mL/min — ABNORMAL LOW (ref >60)
Glucose, Bld: 114 mg/dL — ABNORMAL HIGH (ref 70–99)
Potassium: 4.4 mmol/L (ref 3.5–5.1)
Sodium: 139 mmol/L (ref 135–145)

## 2023-05-17 ENCOUNTER — Other Ambulatory Visit (HOSPITAL_COMMUNITY)

## 2023-05-18 DIAGNOSIS — H52203 Unspecified astigmatism, bilateral: Secondary | ICD-10-CM | POA: Diagnosis not present

## 2023-05-24 DIAGNOSIS — H6123 Impacted cerumen, bilateral: Secondary | ICD-10-CM | POA: Diagnosis not present

## 2023-05-25 ENCOUNTER — Encounter (HOSPITAL_COMMUNITY): Payer: Self-pay | Admitting: *Deleted

## 2023-06-22 ENCOUNTER — Other Ambulatory Visit (HOSPITAL_COMMUNITY): Payer: Self-pay | Admitting: Cardiology

## 2023-07-19 DIAGNOSIS — S8002XA Contusion of left knee, initial encounter: Secondary | ICD-10-CM | POA: Diagnosis not present

## 2023-07-19 DIAGNOSIS — M25561 Pain in right knee: Secondary | ICD-10-CM | POA: Diagnosis not present

## 2023-07-24 DIAGNOSIS — S0012XA Contusion of left eyelid and periocular area, initial encounter: Secondary | ICD-10-CM | POA: Diagnosis not present

## 2023-08-10 DIAGNOSIS — M25562 Pain in left knee: Secondary | ICD-10-CM | POA: Diagnosis not present

## 2023-08-10 DIAGNOSIS — M25561 Pain in right knee: Secondary | ICD-10-CM | POA: Diagnosis not present

## 2023-08-16 DIAGNOSIS — I130 Hypertensive heart and chronic kidney disease with heart failure and stage 1 through stage 4 chronic kidney disease, or unspecified chronic kidney disease: Secondary | ICD-10-CM | POA: Diagnosis not present

## 2023-08-24 DIAGNOSIS — K625 Hemorrhage of anus and rectum: Secondary | ICD-10-CM | POA: Diagnosis not present

## 2023-08-24 DIAGNOSIS — K6289 Other specified diseases of anus and rectum: Secondary | ICD-10-CM | POA: Diagnosis not present

## 2023-08-28 ENCOUNTER — Ambulatory Visit: Admitting: Gastroenterology

## 2023-08-28 ENCOUNTER — Telehealth: Payer: Self-pay

## 2023-08-28 ENCOUNTER — Other Ambulatory Visit (INDEPENDENT_AMBULATORY_CARE_PROVIDER_SITE_OTHER)

## 2023-08-28 ENCOUNTER — Ambulatory Visit: Payer: Self-pay | Admitting: Gastroenterology

## 2023-08-28 ENCOUNTER — Encounter: Payer: Self-pay | Admitting: Gastroenterology

## 2023-08-28 VITALS — BP 104/70 | HR 50 | Ht 68.0 in | Wt 142.4 lb

## 2023-08-28 DIAGNOSIS — D649 Anemia, unspecified: Secondary | ICD-10-CM | POA: Diagnosis not present

## 2023-08-28 DIAGNOSIS — K625 Hemorrhage of anus and rectum: Secondary | ICD-10-CM

## 2023-08-28 DIAGNOSIS — K6289 Other specified diseases of anus and rectum: Secondary | ICD-10-CM

## 2023-08-28 LAB — COMPREHENSIVE METABOLIC PANEL WITH GFR
ALT: 16 U/L (ref 0–53)
AST: 25 U/L (ref 0–37)
Albumin: 3.8 g/dL (ref 3.5–5.2)
Alkaline Phosphatase: 67 U/L (ref 39–117)
BUN: 31 mg/dL — ABNORMAL HIGH (ref 6–23)
CO2: 24 meq/L (ref 19–32)
Calcium: 9.1 mg/dL (ref 8.4–10.5)
Chloride: 111 meq/L (ref 96–112)
Creatinine, Ser: 1.25 mg/dL (ref 0.40–1.50)
GFR: 49.89 mL/min — ABNORMAL LOW (ref >60.00)
Glucose, Bld: 102 mg/dL — ABNORMAL HIGH (ref 70–99)
Potassium: 4.4 meq/L (ref 3.5–5.1)
Sodium: 141 meq/L (ref 135–145)
Total Bilirubin: 0.4 mg/dL (ref 0.2–1.2)
Total Protein: 6.6 g/dL (ref 6.0–8.3)

## 2023-08-28 LAB — CBC WITH DIFFERENTIAL/PLATELET
Basophils Absolute: 0.1 10*3/uL (ref 0.0–0.1)
Basophils Relative: 1.2 % (ref 0.0–3.0)
Eosinophils Absolute: 0.2 10*3/uL (ref 0.0–0.7)
Eosinophils Relative: 4.3 % (ref 0.0–5.0)
HCT: 28.1 % — ABNORMAL LOW (ref 39.0–52.0)
Hemoglobin: 9.2 g/dL — ABNORMAL LOW (ref 13.0–17.0)
Lymphocytes Relative: 17.1 % (ref 12.0–46.0)
Lymphs Abs: 0.8 10*3/uL (ref 0.7–4.0)
MCHC: 32.6 g/dL (ref 30.0–36.0)
MCV: 97 fl (ref 78.0–100.0)
Monocytes Absolute: 0.5 10*3/uL (ref 0.1–1.0)
Monocytes Relative: 9.9 % (ref 3.0–12.0)
Neutro Abs: 3.2 10*3/uL (ref 1.4–7.7)
Neutrophils Relative %: 67.5 % (ref 43.0–77.0)
Platelets: 212 10*3/uL (ref 150.0–400.0)
RBC: 2.89 Mil/uL — ABNORMAL LOW (ref 4.22–5.81)
RDW: 14 % (ref 11.5–15.5)
WBC: 4.7 10*3/uL (ref 4.0–10.5)

## 2023-08-28 LAB — IBC + FERRITIN
Ferritin: 74.8 ng/mL (ref 22.0–322.0)
Iron: 38 ug/dL — ABNORMAL LOW (ref 42–165)
Saturation Ratios: 13.8 % — ABNORMAL LOW (ref 20.0–50.0)
TIBC: 274.4 ug/dL (ref 250.0–450.0)
Transferrin: 196 mg/dL — ABNORMAL LOW (ref 212.0–360.0)

## 2023-08-28 NOTE — Telephone Encounter (Signed)
 Request for surgical clearance:     Endoscopy Procedure  What type of surgery is being performed?     Flex-Sig   When is this surgery scheduled?     09/07/23  What type of clearance is required ?   Pharmacy and Cardiac Clearance  Are there any medications that need to be held prior to surgery and how long? Plavix  x5 days prior to procedure.  Practice name and name of physician performing surgery?      [facility]  What is your office phone and fax number?      Phone- [phone]  Fax- [phone]  Anesthesia type (None, local, MAC, general) ?       MAC  Please route your response to Blondie Barks, CMA

## 2023-08-28 NOTE — Telephone Encounter (Signed)
 Per Dr. Rolan, pt may hold Plavix  for 5 days prior to surgery. Please resume Plavix  as soon as possible postprocedure, at the discretion of the surgeon. Patient and daughter informed before leaving appointment this morning. They have also been made aware they will be contacted to set up a phone call appointment for further preoperative risk assessment by cardiology.

## 2023-08-28 NOTE — Patient Instructions (Signed)
 Your provider has requested that you go to the basement level for lab work before leaving today. Press B on the elevator. The lab is located at the first door on the left as you exit the elevator.  You have been scheduled for a flexible sigmoidoscopy. Please follow the written instructions given to you at your visit today.  If you use inhalers (even only as needed), please bring them with you on the day of your procedure.  DO NOT TAKE 7 DAYS PRIOR TO TEST- Trulicity (dulaglutide) Ozempic, Wegovy (semaglutide) Mounjaro (tirzepatide) Bydureon Bcise (exanatide extended release)  DO NOT TAKE 1 DAY PRIOR TO YOUR TEST Rybelsus (semaglutide) Adlyxin (lixisenatide) Victoza (liraglutide) Byetta (exanatide) ___________________________________________________________________________  Due to recent changes in healthcare laws, you may see the results of your imaging and laboratory studies on MyChart before your provider has had a chance to review them.  We understand that in some cases there may be results that are confusing or concerning to you. Not all laboratory results come back in the same time frame and the provider may be waiting for multiple results in order to interpret others.  Please give us  48 hours in order for your provider to thoroughly review all the results before contacting the office for clarification of your results.   Thank you for choosing me and [facility].  Camie Furbish, PA-C

## 2023-08-28 NOTE — Progress Notes (Signed)
 [name] [id number] 1928-05-18   Chief Complaint: Rectal bleeding, rectal mass, worsening anemia  Referring Provider: Stephane Leita DEL, MD Primary GI MD: Sampson (previous Dr. Debrah)  HPI: [name] [name] is a 89 y.o. male with past medical history of CHF, CAD, MI s/p CABG and stents, NSVT and SVT, HTN, CKD stage IIIa, HLD, MVP s/p repair, OSA on CPAP, kidney stones, diverticulosis, frequent falls, mild cognitive impairment, anemia who presents today for evaluation of a rectal mass, rectal bleeding, and worsening anemia.    Appears patient has had anemia dating as far back as 2018.  Last CBC 04/04/2022 with hemoglobin 10.3, hematocrit 31.9, MCV 98.2  Labs 11/21/2022: Vitamin B12 938, iron 48  Per referral note 08/24/2023 [facility]) patient has had worsening anemia with hemoglobin 8.8 on at visit 6/27 and rectal mass on exam concerning for malignancy.  On Plavix  and aspirin , aspirin  is being held. He is on an oral iron supplement.   Patient is here today with his daughter [name] [name].  States he had an episode last week of significant rectal bleeding.  He reports history of hemorrhoids and does have intermittent rectal bleeding which has been going on for quite a while.  The bleeding he experienced last week was much more than he normally sees.  Saw blood on the toilet paper and in the toilet.  Had to use toilet paper to pack the area to stop bleeding.  Patient has mild cognitive impairment.  It sounds like there was some miscommunication with his daughter regarding what had been going on. He has been having bleeding for longer than she was aware of.  He denies having any diarrhea or constipation.  Has a bowel movement daily.  States stool is brown and he denies melena.  He denies any abdominal pain, nausea, vomiting, acid reflux, heartburn.  Denies dizziness or syncope.   Previous GI Procedures/Imaging   Colonoscopy 06/12/2003 - Diverticulosis in the descending and  sigmoid colon, otherwise normal  Past Medical History:  Diagnosis Date   Acute MI [facility])    Coronary artery disease    Diverticulosis 06-12-2003   Colonoscopy   History of kidney stones    Hyperlipidemia    Hypertension    MVP (mitral valve prolapse)    Undiagnosed cardiac murmurs    Urinary retention     Past Surgical History:  Procedure Laterality Date   CARDIAC CATHETERIZATION     CORONARY ANGIOPLASTY     MITRAL VALVE REPAIR     2/09   TOTAL KNEE ARTHROPLASTY Left 08/16/2018   Procedure: TOTAL KNEE ARTHROPLASTY;  Surgeon: Gerome Charleston, MD;  Location: [facility];  Service: Orthopedics;  Laterality: Left;  with adductor canal    Current Outpatient Medications  Medication Sig Dispense Refill   aspirin  EC 81 MG tablet Take 1 tablet (81 mg total) by mouth daily. 90 tablet 3   clopidogrel  (PLAVIX ) 75 MG tablet TAKE ONE TABLET ONCE DAILY 30 tablet 11   ferrous sulfate  (FERROUSUL) 325 (65 FE) MG tablet Take 1 tablet (325 mg total) by mouth 3 (three) times daily with meals.  3   furosemide  (LASIX ) 20 MG tablet Take 1 tablet (20 mg total) by mouth every other day. 15 tablet 3   lisinopril  (ZESTRIL ) 5 MG tablet Take 1 tablet (5 mg total) by mouth daily. NEEDS FOLLOW UP APPOINTMENT FOR MORE REFILLS 90 tablet 3   metoprolol  succinate (TOPROL -XL) 25 MG 24 hr tablet TAKE 1/2 TABLET BY MOUTH EVERY DAY 45  tablet 3   Multiple Vitamin (MULTIVITAMIN) tablet Take 1 tablet by mouth daily.       potassium chloride  SA (KLOR-CON  M) 20 MEQ tablet Take 1 tablet (20 mEq total) by mouth daily. NEEDS FOLLOW UP APPOINTMENT FOR MORE REFILLS 90 tablet 3   simvastatin  (ZOCOR ) 40 MG tablet Take 1 tablet (40 mg total) by mouth daily. NEEDS FOLLOW UP APPOINTMENT FOR MORE REFILLS 90 tablet 3   Tamsulosin  HCl (FLOMAX ) 0.4 MG CAPS Take 0.4 mg by mouth daily.      No current facility-administered medications for this visit.    Allergies as of 08/28/2023   (No Known Allergies)    Family History  Problem Relation  Age of Onset   Hypertension Other    Colon cancer Neg Hx     Social History   Tobacco Use   Smoking status: Never   Smokeless tobacco: Never  Vaping Use   Vaping status: Never Used  Substance Use Topics   Alcohol  use: Yes    Alcohol /week: 1.0 standard drink of alcohol     Types: 1 Glasses of wine per week    Comment: rare   Drug use: No     Review of Systems:    Constitutional: No weight loss, fever, chills, weakness or fatigue Skin: No rash or itching Cardiovascular: No chest pain, chest pressure or palpitations   Respiratory: No SOB or cough Gastrointestinal: See HPI and otherwise negative Genitourinary: No dysuria Neurological: No headache, dizziness or syncope Hematologic: Positive rectal bleeding    Physical Exam:  Vital signs: BP 104/70   Pulse (!) 50   Ht 5' 8 (1.727 m)   Wt 142 lb 6.4 oz (64.6 kg)   BMI 21.65 kg/m    Constitutional: Pleasant elderly male in NAD, alert and cooperative Head:  Normocephalic and atraumatic.  Eyes: No scleral icterus.  Respiratory: Respirations even and unlabored. Lungs clear to auscultation bilaterally.  No wheezes, crackles, or rhonchi.  Cardiovascular:  Regular rate and rhythm. No murmurs.  Mild bilateral nonpitting lower extremity edema. Gastrointestinal:  Soft, nondistended, nontender. No rebound or guarding. Normal bowel sounds. No appreciable masses or hepatomegaly. Rectal:  Rectal mass seen on external exam and anoscopy (performed by Dr. Shila in office today). Chaperone present.  Neurologic:  Alert and oriented x3 Skin:   Dry and intact without significant lesions or rashes. Psychiatric: Oriented to person, place and situation. Tangential speech   RELEVANT LABS AND IMAGING: CBC    Component Value Date/Time   WBC 4.1 04/04/2022 0959   RBC 3.25 (L) 04/04/2022 0959   HGB 10.3 (L) 04/04/2022 0959   HCT 31.9 (L) 04/04/2022 0959   PLT 219 04/04/2022 0959   MCV 98.2 04/04/2022 0959   MCH 31.7 04/04/2022 0959    MCHC 32.3 04/04/2022 0959   RDW 13.7 04/04/2022 0959   LYMPHSABS 1.0 08/19/2018 0313   MONOABS 0.6 08/19/2018 0313   EOSABS 0.0 08/19/2018 0313   BASOSABS 0.0 08/19/2018 0313    CMP     Component Value Date/Time   NA 139 05/16/2023 1531   K 4.4 05/16/2023 1531   CL 113 (H) 05/16/2023 1531   CO2 18 (L) 05/16/2023 1531   GLUCOSE 114 (H) 05/16/2023 1531   BUN 44 (H) 05/16/2023 1531   CREATININE 1.61 (H) 05/16/2023 1531   CREATININE 1.21 (H) 07/28/2015 1532   CALCIUM 9.1 05/16/2023 1531   PROT 6.3 04/05/2007 0938   ALBUMIN 3.7 04/05/2007 0938   AST 26 04/05/2007 0938   ALT  19 04/05/2007 0938   ALKPHOS 58 04/05/2007 0938   BILITOT 0.8 04/05/2007 0938   GFRNONAA 40 (L) 05/16/2023 1531   GFRAA 54 (L) 02/11/2019 1126   Echocardiogram 05/08/2023 1. Left ventricular ejection fraction, by estimation, is 60 to 65% . The left ventricle has normal function. The left ventricle has no regional wall motion abnormalities. There is mild left ventricular hypertrophy. Left ventricular diastolic function could not be evaluated.  2. Right ventricular systolic function is normal. The right ventricular size is normal.  3. Left atrial size was severely dilated.  4. Right atrial size was mildly dilated.  5. S/ p mitral valve annuloplasty ring (2009) . The mitral valve has been repaired/ replaced. Mild mitral valve regurgitation. No evidence of mitral stenosis. The mean mitral valve gradient is 3. 0 mmHg with average heart rate of 56 bpm. There is a present in the mitral position.  6. Tricuspid valve regurgitation is moderate.  7. Peak velocity 3.09 m/s, MG 21 mmHg, AVA VTI 1.23 cm2, DI 0.32. The aortic valve is tricuspid. There is moderate calcification of the aortic valve. There is moderate thickening of the aortic valve. Aortic valve regurgitation is mild. Moderate aortic valve stenosis.  8. Aortic dilatation noted. There is dilatation of the aortic root, measuring 40 mm. There is dilatation of the  ascending aorta, measuring 39 mm.  9. The inferior vena cava is normal in size with greater than 50% respiratory variability, suggesting right atrial pressure of 3 mmHg.  Assessment/Plan:   Rectal mass Rectal bleeding  Anemia Patient seen today having been referred by his PCP for rectal bleeding, worsening anemia, and finding of rectal mass on exam.  He reports that he has history of hemorrhoids and has intermittent rectal bleeding which has been going on for a while, but last week had significant episode of bleeding which was more than he had previously experienced.  He had a drop in his hemoglobin from 10.3 to 8.8 on 08/24/2023.  States that bleeding comes and goes and he has not had a repeat episode of significant bleeding.  Denies any abdominal pain.  States that he has daily bowel movements.  Denies upper GI symptoms.  Weight stable. Rectal mass is noted on exam today which is concerning for possible malignancy.  Last colonoscopy in 2005 with finding of diverticulosis and otherwise normal.  - Schedule sigmoidoscopy. I thoroughly discussed the procedure with the patient to include nature of the procedure, alternatives, benefits, and risks (including but not limited to bleeding, infection, perforation, anesthesia/cardiac/pulmonary complications). Patient verbalized understanding and gave verbal consent to proceed with procedure.  - Repeat CBC, CMP, IBC+ferritin - Will request cardiac clearance and Plavix  hold.   Case and plan discussed with Dr. Nandigam in office today.    Camie Furbish, PA-C [facility] 08/28/2023, 8:03 AM  Patient Care Team: Stephane Leita DEL, MD as PCP - General (Internal Medicine) Rolan Ezra RAMAN, MD as PCP - Advanced Heart Failure (Cardiology) Shlomo Wilbert SAUNDERS, MD as PCP - Sleep Medicine (Cardiology) Landy Atlas, MD (Internal Medicine)

## 2023-08-28 NOTE — Telephone Encounter (Signed)
 Left message to call back to schedule a tele pre op appt.  ?

## 2023-08-28 NOTE — Telephone Encounter (Signed)
   Name: [name] [name]  DOB: February 14, 1929  MRN: [id number]  Primary Cardiologist: None   Preoperative team, please contact this patient and set up a phone call appointment for further preoperative risk assessment. Please obtain consent and complete medication review. Thank you for your help.  I confirm that guidance regarding antiplatelet and oral anticoagulation therapy has been completed and, if necessary, noted below.  Per Dr. Rolan, pt may hold Plavix  for 5 days prior to surgery. Please resume Plavix  as soon as possible postprocedure, at the discretion of the surgeon.   I also confirmed the patient resides in the state of [place] . As per [facility] telemedicine laws, the patient must reside in the state in which the provider is licensed.   Wyn Raddle, Jackee Shove, NP 08/28/2023, 10:17 AM [facility]

## 2023-08-29 ENCOUNTER — Ambulatory Visit (HOSPITAL_COMMUNITY)
Admission: RE | Admit: 2023-08-29 | Discharge: 2023-08-29 | Disposition: A | Discharge status: Home / Self Care | Source: Ambulatory Visit | Attending: Family Medicine | Admitting: Family Medicine

## 2023-08-29 ENCOUNTER — Telehealth: Payer: Self-pay | Admitting: Gastroenterology

## 2023-08-29 ENCOUNTER — Encounter (HOSPITAL_COMMUNITY): Payer: Self-pay

## 2023-08-29 ENCOUNTER — Telehealth (HOSPITAL_COMMUNITY): Payer: Self-pay | Admitting: Cardiology

## 2023-08-29 VITALS — BP 108/60 | HR 71 | Wt 143.2 lb

## 2023-08-29 DIAGNOSIS — I350 Nonrheumatic aortic (valve) stenosis: Secondary | ICD-10-CM

## 2023-08-29 DIAGNOSIS — E7800 Pure hypercholesterolemia, unspecified: Secondary | ICD-10-CM

## 2023-08-29 DIAGNOSIS — K625 Hemorrhage of anus and rectum: Secondary | ICD-10-CM | POA: Insufficient documentation

## 2023-08-29 DIAGNOSIS — G4733 Obstructive sleep apnea (adult) (pediatric): Secondary | ICD-10-CM | POA: Diagnosis not present

## 2023-08-29 DIAGNOSIS — R609 Edema, unspecified: Secondary | ICD-10-CM | POA: Insufficient documentation

## 2023-08-29 DIAGNOSIS — I129 Hypertensive chronic kidney disease with stage 1 through stage 4 chronic kidney disease, or unspecified chronic kidney disease: Secondary | ICD-10-CM | POA: Diagnosis not present

## 2023-08-29 DIAGNOSIS — D631 Anemia in chronic kidney disease: Secondary | ICD-10-CM | POA: Diagnosis not present

## 2023-08-29 DIAGNOSIS — I5032 Chronic diastolic (congestive) heart failure: Secondary | ICD-10-CM | POA: Insufficient documentation

## 2023-08-29 DIAGNOSIS — N183 Chronic kidney disease, stage 3 unspecified: Secondary | ICD-10-CM | POA: Diagnosis not present

## 2023-08-29 DIAGNOSIS — Z951 Presence of aortocoronary bypass graft: Secondary | ICD-10-CM | POA: Insufficient documentation

## 2023-08-29 DIAGNOSIS — M75101 Unspecified rotator cuff tear or rupture of right shoulder, not specified as traumatic: Secondary | ICD-10-CM | POA: Diagnosis not present

## 2023-08-29 DIAGNOSIS — Z9889 Other specified postprocedural states: Secondary | ICD-10-CM

## 2023-08-29 DIAGNOSIS — I2510 Atherosclerotic heart disease of native coronary artery without angina pectoris: Secondary | ICD-10-CM | POA: Insufficient documentation

## 2023-08-29 DIAGNOSIS — I082 Rheumatic disorders of both aortic and tricuspid valves: Secondary | ICD-10-CM | POA: Diagnosis not present

## 2023-08-29 DIAGNOSIS — Z7982 Long term (current) use of aspirin: Secondary | ICD-10-CM | POA: Insufficient documentation

## 2023-08-29 DIAGNOSIS — E785 Hyperlipidemia, unspecified: Secondary | ICD-10-CM | POA: Diagnosis not present

## 2023-08-29 DIAGNOSIS — R001 Bradycardia, unspecified: Secondary | ICD-10-CM

## 2023-08-29 DIAGNOSIS — I493 Ventricular premature depolarization: Secondary | ICD-10-CM | POA: Diagnosis not present

## 2023-08-29 DIAGNOSIS — Z7902 Long term (current) use of antithrombotics/antiplatelets: Secondary | ICD-10-CM | POA: Diagnosis not present

## 2023-08-29 DIAGNOSIS — Z79899 Other long term (current) drug therapy: Secondary | ICD-10-CM | POA: Diagnosis not present

## 2023-08-29 DIAGNOSIS — M12811 Other specific arthropathies, not elsewhere classified, right shoulder: Secondary | ICD-10-CM | POA: Diagnosis not present

## 2023-08-29 DIAGNOSIS — I071 Rheumatic tricuspid insufficiency: Secondary | ICD-10-CM

## 2023-08-29 MED ORDER — POTASSIUM CHLORIDE CRYS ER 20 MEQ PO TBCR: 40.0000 meq | EXTENDED_RELEASE_TABLET | Freq: Every day | ORAL | 3 refills | Status: AC

## 2023-08-29 MED ORDER — FUROSEMIDE 20 MG PO TABS: 20.0000 mg | ORAL_TABLET | Freq: Every day | ORAL | 3 refills | Status: AC

## 2023-08-29 NOTE — Telephone Encounter (Signed)
 Called patient and lvm to give the pre-op team a call back. Patient needs to be scheduled for a tele visit today or tomorrow in order to be cleared in time for his procedure.

## 2023-08-29 NOTE — Telephone Encounter (Signed)
 Left message on machine to call back

## 2023-08-29 NOTE — Progress Notes (Addendum)
 Patient ID: [name] [name], male   DOB: 10-Aug-1928, 89 y.o.   MRN: [id number] PCP: Dr. Landy Cardiology: Dr. Rolan  89 y.o.with history of CAD s/p CABG and mitral regurgitation s/p mitral valve repair presents for followup of CAD.   Echo in 5/20 showed EF 50-55% with inferior hypokinesis, mild AS, mild-moderate AI, s/p MV repair with stable mitral valve.    In 6/20, he had left TKR.  Cardiology was consulted while he was in the hospital for ?atrial fibrillation, but this was thought to actually be frequent PACs versus MAT.    At a prior appointment, he reported significant fatigue and dyspnea post-op. CTA chest showed no PE or other active disease.  Cardiolite was done in 7/20 showing EF 61%, small fixed basal anterior defect, no ischemia.  I had him increase Lasix  to 40 mg daily due to concern for possible diastolic CHF.  He wore a 30 day monitor which showed short NSVT runs, no atrial fibrillation.  Sleep study showed severe OSA and he was started on CPAP.  Zio patch again in 12/20 showed 6.3% PVCs, Toprol  XL was started.   Echo in 12/21 showed EF 50-55%, septal-lateral dyssynchrony, mild LVH, normal RV, s/p MV repair with trivial MR and probably mild mitral stenosis, moderate AI, mild AS mean gradient 12 mmHg and AVA 1.72 cm^2.   Echo in 1/23 showed EF 50-55%, mild RV enlargement with moderately decreased RV systolic function, s/p MV repair with mean gradient 4 mmHg and mild MR, moderate-severe TR, mild-moderate AI and AS with mean gradient 17 mmHg, IVC normal.   Echo in 2/24 showed EF 45-50%, s/p MV repair with mild MR and no mitral stenosis, mild AS mean gradient 13 mmHg with AVA 1.67 cm^2, severe TR, mild RV enlargement with mildly decreased RV systolic function.   Echo 3/25 showed EF 60-65%, normal RV, s/p MV repair with mild MR and no mitral stenosis, moderate TR, moderate AS mean gradient 21 mmHg with AVA VTI 1.23 cm2      Today he returns for an acute visit with daughter with complaints of  new LEE. Swelling started a few days ago. Some history from daughter as Kishan has some memory deficits. He has had rectal bleeding and is planning colonoscopy next week and daughter wanted to make sure he was OK to undergo procedure. He lives alone, wife is in SNF and daughter and her family are in [place]. He is not SOB walking on flat ground. He still does yardwork, had a mechanical fall while raking leaves.  Denies palpitations, CP, dizziness, or PND/Orthopnea. Appetite ok.  Taking all medications.   ReDs reading: 22 %, abnormal  ECG (personally reviewed): NSR, IVCD QRS 146 msec  Labs (12/22): LDL 82, K 4.4, creatinine 8.67 Labs (2/24): hgb 10.3, BNP 157, K 5, creatinine 1.47 Labs (2/25): LDL 53 Labs (7/25): K 4.4, creatinine 1.25  PMH: 1. CAD: Acute MI in 2006 with ventricular fibrillation arrest.  Patient had 1st generation DES to LCx.  Patient then had CABG 2009 with LIMA-OM and SVG-PDA.  Plan has been to continue DAPT long-term because of 1st generation DES.  - Cardiolite (7/20): EF 61%, small fixed basal anterior defect, no ischemia. 2. Nephrolithiasis 3. HTN 4. Hyperlipidemia 5. Diverticulosis 6. Aortic stenosis: Mild on 7/20 echo. 7. Mitral valve prolapse with severe MR: MV repair in 2009.  Echo (2/14) with EF 50-55%, inferior HK, mild AS with mean gradient 12 mmHg, s/p MV repair with mild MR and no significant MS (  mean gradient 3 mmHg), mild RV dilation, PA systolic pressure 37 mmHg.  Echo (3/16) with EF 50-55%, mild focal basal septal hypertrophy, mild aortic stenosis mean gradient 14 mmHg, s/p MV repair with mild MR and no MS, PASP 38 mmHg.  - Echo (5/20): EF 50-55% with inferior hypokinesis, mild AS, mild-moderate AI, s/p MV repair with stable mitral valve. - Echo (12/21): EF 50-55%, septal-lateral dyssynchrony, mild LVH, nomral RV, s/p MV repair with trivial MR and probably mild mitral stenosis, moderate AI, mild AS mean gradient 12 mmHg and AVA 1.72 cm^2.  - Echo (1/23): EF  50-55%, mild RV enlargement with moderately decreased RV systolic function, s/p MV repair with mean gradient 4 mmHg and mild MR, moderate-severe TR, mild-moderate AI and AS with mean gradient 17 mmHg, IVC normal.  - Echo (2/24): EF 45-50%, s/p MV repair with mild MR and no mitral stenosis, mild AS mean gradient 13 mmHg with AVA 1.67 cm^2, severe TR, mild RV enlargement with mildly decreased RV systolic function.  - Echo 3/25 showed EF 60-65%, normal RV, s/p MV repair with mild MR and no mitral stenosis, moderate TR, moderate AS mean gradient 21 mmHg with AVA VTI 1.23 cm2  8. Thyroid  nodule: Biopsy with benign follicular nodule in 2018. 9. BPH  10. OSA: Uses CPAP.  11. Palpitations: 30 day monitor (8/20) showed short NSVT runs, no atrial fibrillation.  - 12/20 Zio patch: 6.3% PVCs 12. Aortic valve disorder: 2/24 with mild aortic stenosis. Moderate AS on 3/25 echo  13. CKD: Stage 3.  14. Tricuspid regurgitation: Severe on 2/24 echo. 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   FH: HTN  ROS: All systems reviewed and negative except as per HPI.   Current Outpatient Medications  Medication Sig Dispense Refill   aspirin  EC 81 MG tablet Take 1 tablet (81 mg total) by mouth daily. 90 tablet 3   clopidogrel  (PLAVIX ) 75 MG tablet TAKE ONE TABLET ONCE DAILY 30 tablet 11   ferrous sulfate  325 (65 FE) MG EC tablet Take 325 mg by mouth 2 (two) times daily.     lisinopril  (ZESTRIL ) 5 MG tablet Take 1 tablet (5 mg total) by mouth daily. NEEDS FOLLOW UP APPOINTMENT FOR MORE REFILLS 90 tablet 3   metoprolol  succinate (TOPROL -XL) 25 MG 24 hr tablet TAKE 1/2 TABLET BY MOUTH EVERY DAY 45 tablet 3   Multiple Vitamin (MULTIVITAMIN) tablet Take 1 tablet by mouth daily.       simvastatin  (ZOCOR ) 40 MG tablet Take 1 tablet (40 mg total) by mouth daily. NEEDS FOLLOW UP APPOINTMENT FOR MORE REFILLS 90 tablet 3   Tamsulosin  HCl (FLOMAX ) 0.4 MG CAPS Take 0.4 mg  by mouth daily.      furosemide  (LASIX ) 20 MG tablet Take 1 tablet (20 mg total) by mouth daily. 90 tablet 3   potassium chloride  SA (KLOR-CON  M) 20 MEQ tablet Take 2 tablets (40 mEq total) by mouth daily. 180 tablet 3   No current facility-administered medications for this encounter.   Wt Readings from Last 3 Encounters:  08/29/23 65 kg (143 lb 3.2 oz)  08/28/23 64.6 kg (142 lb 6.4 oz)  04/20/23 62.1 kg (136 lb 12.8 oz)    BP 108/60   Pulse 71   Wt 65 kg (143 lb 3.2 oz)   SpO2 97%   BMI 21.77 kg/m  Physical Exam General:  NAD. No resp difficulty, walked into clinic HEENT: Normal Neck: Supple.  No JVD. Cor: Regular rate & rhythm. No rubs, gallops, 3/6 SEM RUSB Lungs: Clear, diminished in bases Abdomen: Soft, nontender, nondistended.  Extremities: No cyanosis, clubbing, rash, 2+ BLE pre-tibial edema Neuro: Alert & oriented x 3, moves all 4 extremities w/o difficulty. Affect pleasant.  Assessment/Plan: 1. CAD: S/p 1st generation DES to LCx and later CABG. Cardiolite was done in 7/20, this showed no ischemia.  No chest pain.  - Patient has been on ASA and Plavix  long-term.  Based on current data, he could stop ASA and continue Plavix , but he prefers to continue both.  - Continue statin, good lipids 2/25. 2. S/p MV repair: Echo in 2/24 showed stable MV repair with mean gradient 2 mmHg and mild MR. Stable on echo 3/25, mean gradient 3 mmHg. 3. Aortic valve disorder: Echo in 2/24 with mild AS.  Now with moderate AS on echo 3/25, mean gradient 21 mmHg 4. Hyperlipidemia: Continue statin.  5. HTN: BP stable today, no changes.  6. Chronic HF with mid range EF: Echo in 2/24 with EF 45-50%, s/p MV repair with mild MR and no mitral stenosis, mild AS mean gradient 13 mmHg with AVA 1.67 cm^2, severe TR, mild RV enlargement with mildly decreased RV systolic function.  Echo 3/25 showed  EF 60-65%, normal RV, s/p MV repair with mild MR and no mitral stenosis, moderate TR, moderate AS mean gradient  21 mmHg with AVA VTI 1.23 cm2. NYHA class II symptoms.  Still very active with yardwork, main limitations are orthopedic. He is mildly volume overloaded, ReDS 22% so R>L HF - Place compression hose - Increase Lasix  to 20 mg daily, increase KCL to 40 daily. Labs reviewed from yesterday and are stable, K 4.4, SCr 1.25. Repeat BMET and BNP in 10-14 days - Continue Toprol  XL 12.5 mg daily, would not increase with HR 50.  - Continue lisinopril  5 mg daily. Labs reviewed from 08/28/23 and are stable 7. PVCs: Now on a beta blocker, with no palpitations. No PVCs on ECG today. 8. OSA: Continue CPAP.   9. Bradycardia: HR stable in 50-60s. Continue current Toprol  XL.    10. Tricuspid regurgitation: Severe on 2/24 echo but minimal symptoms.  Repeat echo 3/25 showed moderate TR. - Would manage conservatively.  - Diuresis as above. 11. Rectal bleeding/Rectal Mass/Anemia: Has sigmoidoscopy  planned for next week. He is at low/moderate CV risk for procedure with valvular disease, but not prohibitive. OK to hold Plavix  5 days prior. - RCRI score= 2 pts (10.1% risk of major cardiac event)  Follow up 6 months with Dr. Rolan.  Harlene HERO [facility] FNP-BC 08/29/2023

## 2023-08-29 NOTE — Telephone Encounter (Signed)
 Inbound call from patient's daughter, states patient went to see an orthopedic provider this morning and was bleeding rectally. She would like to know if this is something that can be managed and what she can do, or if she should wait until his procedure.

## 2023-08-29 NOTE — Telephone Encounter (Addendum)
 Spoke with patient this afternoon. Gave him the number to contact Cardiology to schedule tele pre-op visit. Advised that it is very important, otherwise we will have to postpone his procedure. Patient voiced understanding. We may contact Daughter- Karna, if needed at [phone].

## 2023-08-29 NOTE — Progress Notes (Signed)
 ReDS Vest / Clip - 08/29/23 1400       ReDS Vest / Clip   Station Marker C    Ruler Value 32    ReDS Value Range Low volume    ReDS Actual Value 22

## 2023-08-29 NOTE — Telephone Encounter (Signed)
 Patients daughter called to request appt for add on Concerned with LE edema and upcoming GI procedure    ADD ON 7/2 @ 2

## 2023-08-29 NOTE — Telephone Encounter (Signed)
 The pt daughter has been advised to make sure the pt keeps appt as planned for Flex and if bleeding becomes heavy or worse he should go to the ED or UC for evaluation. The pt has been advised of the information and verbalized understanding.

## 2023-08-29 NOTE — Patient Instructions (Addendum)
 CHANGE Lasix  to 20 mg daily.  CHANGE Potassium to 40 mEq ( 4 Tabs) daily.  Blood work in 10 days.  Please wear your CPAP  Please wear your compression hose.  Your physician recommends that you schedule a follow-up appointment in: 6 months ( January 2026) ** PLEASE CALL THE OFFICE IN NOVEMBER TO ARRANGE YOUR FOLLOW UP APPOINTMENT.**  If you have any questions or concerns before your next appointment please send us  a message through [place] or call our office at [phone].    TO LEAVE A MESSAGE FOR THE NURSE SELECT OPTION 2, PLEASE LEAVE A MESSAGE INCLUDING: YOUR NAME DATE OF BIRTH CALL BACK NUMBER REASON FOR CALL**this is important as we prioritize the call backs  YOU WILL RECEIVE A CALL BACK THE SAME DAY AS LONG AS YOU CALL BEFORE 4:00 PM  At the [facility], you and your health needs are our priority. As part of our continuing mission to provide you with exceptional heart care, we have created designated Provider Care Teams. These Care Teams include your primary Cardiologist (physician) and Advanced Practice Providers (APPs- Physician Assistants and Nurse Practitioners) who all work together to provide you with the care you need, when you need it.   You may see any of the following providers on your designated Care Team at your next follow up: Dr Toribio Fuel Dr Ezra Shuck Dr. Ria Commander Dr. Morene Brownie Amy Lenetta, NP Caffie Shed, [place] [facility] [place], [place] Beckey Coe, NP [place] Lee, NP Ellouise Class, NP Tinnie Redman, PharmD Jaun Bash, PharmD   Please be sure to bring in all your medications bottles to every appointment.    Thank you for choosing [facility]

## 2023-08-30 ENCOUNTER — Telehealth: Payer: Self-pay | Admitting: Gastroenterology

## 2023-08-30 NOTE — Telephone Encounter (Signed)
 Patient daughter called and stated that she was wanting to know if we have heard back from the cardiologist regarding her father cardiac [name] [name]. Patient daughter is requesting a call back. Best call back number for patient daughter is [phone]. Please advise.

## 2023-08-30 NOTE — Telephone Encounter (Signed)
   Patient Name: [name] [name]  DOB: 03-07-1928 MRN: [id number]  Primary Cardiologist: None  Chart reviewed as part of pre-operative protocol coverage. Given past medical history and time since last visit, based on ACC/AHA guidelines, [name] [name] is at acceptable risk for the planned procedure without further cardiovascular testing.   Patient was seen 08/29/2023 and preoperative clearance was addressed.  Per Harlene Gainer, FNP on 08/29/2023: Rectal bleeding/Rectal Mass/Anemia: Has sigmoidoscopy  planned for next week. He is at low/moderate CV risk for procedure with valvular disease, but not prohibitive. OK to hold Plavix  5 days prior.  - RCRI score= 2 pts (10.1% risk of major cardiac event)   Will route note to requesting office.  I will route this recommendation to the requesting party via Epic fax function and remove from pre-op pool.  Please call with questions.  Lum LITTIE Louis, NP 08/30/2023, 11:07 AM

## 2023-08-30 NOTE — Telephone Encounter (Signed)
 Patient and daughter have been informed - they both voiced understanding  Per Harlene Gainer, FNP on 08/29/2023:  He is at low/moderate CV risk for procedure with valvular disease, but not prohibitive. OK to hold Plavix  5 days prior.

## 2023-08-30 NOTE — Telephone Encounter (Signed)
 Spoke with Karna and informed her that Cardiology has given clearance cardiac and to hold Plavix  x5 days prior to procedure. She voiced understanding.

## 2023-09-03 ENCOUNTER — Ambulatory Visit (HOSPITAL_COMMUNITY): Payer: Self-pay | Admitting: Family Medicine

## 2023-09-06 ENCOUNTER — Telehealth (HOSPITAL_COMMUNITY): Payer: Self-pay | Admitting: Cardiology

## 2023-09-06 NOTE — Telephone Encounter (Signed)
Pt aware via daughter.

## 2023-09-06 NOTE — Telephone Encounter (Signed)
 Daughter called the after-hours answering service, according to the daughter, swelling and weeping leg is up to the knee.  I instructed the daughter to give extra dose of Lasix  for 2 days and reassess, if swelling is still significant may give extra Lasix  on Saturday as well.  Patient has flex sig/EGD tomorrow, history of hold Lasix  in the morning of the procedure, can always give the Lasix  after he gets home after the procedure.

## 2023-09-06 NOTE — Telephone Encounter (Signed)
 Daughter called again to report BLE  Reports weight is unchanged since last OV despite med changes. Daughter reports weeping on LLE last night despite use of compression stocking.   Pt is otherwise asymptomatic, did well on vacation  Daughter requested add on visit 7/10 for one more HF check before procedure 7/11  -no add on appts available will forward to provider for input

## 2023-09-06 NOTE — Telephone Encounter (Signed)
 Please call and instruct to take an extra 20 mg of lasix .   Keep legs elevated as able.   Tanieka Pownall NP-C  12:49 PM

## 2023-09-07 ENCOUNTER — Encounter: Payer: Self-pay | Admitting: Internal Medicine

## 2023-09-07 ENCOUNTER — Ambulatory Visit (AMBULATORY_SURGERY_CENTER): Admitting: Internal Medicine

## 2023-09-07 VITALS — BP 132/53 | HR 49 | Temp 97.8°F | Resp 15 | Ht 68.0 in | Wt 142.0 lb

## 2023-09-07 DIAGNOSIS — K5730 Diverticulosis of large intestine without perforation or abscess without bleeding: Secondary | ICD-10-CM

## 2023-09-07 DIAGNOSIS — K644 Residual hemorrhoidal skin tags: Secondary | ICD-10-CM | POA: Diagnosis not present

## 2023-09-07 DIAGNOSIS — K648 Other hemorrhoids: Secondary | ICD-10-CM

## 2023-09-07 DIAGNOSIS — K6289 Other specified diseases of anus and rectum: Secondary | ICD-10-CM | POA: Diagnosis not present

## 2023-09-07 MED ORDER — SODIUM CHLORIDE 0.9 % IV SOLN: 500.0000 mL | Freq: Once | INTRAVENOUS | Status: DC

## 2023-09-07 MED ORDER — HYDROCORTISONE (PERIANAL) 2.5 % EX CREA: 1.0000 | TOPICAL_CREAM | Freq: Two times a day (BID) | CUTANEOUS | 1 refills | Status: AC

## 2023-09-07 NOTE — Progress Notes (Signed)
 Report to PACU, RN, vss, BBS= Clear.

## 2023-09-07 NOTE — Patient Instructions (Addendum)
 I saw hemorrhoids but did not see a rectal mass today.  I am thinking he had bleeding from hemorrhoids or perhaps from his diverticulosis.  I have prescribed some hydrocortisone  cream.  He can restart Plavix  today.  I appreciate the opportunity to care for you. Lupita CHARLENA Commander, MD, FACG   YOU HAD AN ENDOSCOPIC PROCEDURE TODAY AT THE [facility]:   Refer to the procedure report that was given to you for any specific questions about what was found during the examination.  If the procedure report does not answer your questions, please call your gastroenterologist to clarify.  If you requested that your care partner not be given the details of your procedure findings, then the procedure report has been included in a sealed envelope for you to review at your convenience later.  YOU SHOULD EXPECT: Some feelings of bloating in the abdomen. Passage of more gas than usual.  Walking can help get rid of the air that was put into your GI tract during the procedure and reduce the bloating. If you had a lower endoscopy (such as a colonoscopy or flexible sigmoidoscopy) you may notice spotting of blood in your stool or on the toilet paper. If you underwent a bowel prep for your procedure, you may not have a normal bowel movement for a few days.  Please Note:  You might notice some irritation and congestion in your nose or some drainage.  This is from the oxygen used during your procedure.  There is no need for concern and it should clear up in a day or so.  SYMPTOMS TO REPORT IMMEDIATELY:  Following lower endoscopy (colonoscopy or flexible sigmoidoscopy):  Excessive amounts of blood in the stool  Significant tenderness or worsening of abdominal pains  Swelling of the abdomen that is new, acute  Fever of 100F or higher  For urgent or emergent issues, a gastroenterologist can be reached at any hour by calling [phone]. Do not use MyChart messaging for urgent concerns.    DIET:  We do  recommend a small meal at first, but then you may proceed to your regular diet.  Drink plenty of fluids but you should avoid alcoholic beverages for 24 hours.  ACTIVITY:  You should plan to take it easy for the rest of today and you should NOT DRIVE or use heavy machinery until tomorrow (because of the sedation medicines used during the test).    FOLLOW UP: Our staff will call the number listed on your records the next business day following your procedure.  We will call around 7:15- 8:00 am to check on you and address any questions or concerns that you may have regarding the information given to you following your procedure. If we do not reach you, we will leave a message.     If any biopsies were taken you will be contacted by phone or by letter within the next 1-3 weeks.  Please call us  at [phone] if you have not heard about the biopsies in 3 weeks.    SIGNATURES/CONFIDENTIALITY: You and/or your care partner have signed paperwork which will be entered into your electronic medical record.  These signatures attest to the fact that that the information above on your After Visit Summary has been reviewed and is understood.  Full responsibility of the confidentiality of this discharge information lies with you and/or your care-partner.

## 2023-09-07 NOTE — Op Note (Addendum)
 [facility] Patient Name: [name] [name] Procedure Date: 09/07/2023 10:50 AM MRN: [id number] Endoscopist: Lupita FORBES Commander , MD, [id number] Age: 89 Referring MD:  Date of Birth: [birth year] Gender: Male Account #: [id number] Procedure:                Flexible Sigmoidoscopy Indications:              Rectal mass Medicines:                Monitored Anesthesia Care Procedure:                Pre-Anesthesia Assessment:                           - Prior to the procedure, a History and Physical                            was performed, and patient medications and                            allergies were reviewed. The patient's tolerance of                            previous anesthesia was also reviewed. The risks                            and benefits of the procedure and the sedation                            options and risks were discussed with the patient.                            All questions were answered, and informed consent                            was obtained. Prior Anticoagulants: The patient                            last took Plavix  (clopidogrel ) 5 days prior to the                            procedure. ASA Grade Assessment: III - A patient                            with severe systemic disease. After reviewing the                            risks and benefits, the patient was deemed in                            satisfactory condition to undergo the procedure.                           After obtaining informed consent, the scope was  passed under direct vision. The [id number]-[id number] Slim SN                            [id number] was introduced through the anus and                            advanced to the the sigmoid colon. The flexible                            sigmoidoscopy was accomplished without difficulty.                            The patient tolerated the procedure well. The                            quality of the bowel preparation was  adequate. The                            bowel preparation used was Fleet enema and                            magnesium citrate. Scope In: 11:16:58 AM Scope Out: 11:20:20 AM Total Procedure Duration: 0 hours 3 minutes 22 seconds  Findings:                 Hemorrhoids were found on perianal exam. I did not                            palpate a mass on digital exam.                           External and internal hemorrhoids were found.                           Diverticula were found in the sigmoid colon.                           The exam was otherwise without abnormality. Complications:            No immediate complications. Estimated Blood Loss:     Estimated blood loss: none. Impression:               - Hemorrhoids found on perianal exam.                           - External and internal hemorrhoids. No mass                            palpated or seen - ? if hemorrhoids were larger and                            mimicked mass when seen in the office. Increased                            bleeding could have been hemorrhoids  or                            diverticulosis.                           - Diverticulosis in the sigmoid colon. Prep was not                            adequate beyond sigmoid.                           - The examination was otherwise normal including                            retroflexion.                           - No specimens collected. Recommendation:           - Patient has a contact number available for                            emergencies. The signs and symptoms of potential                            delayed complications were discussed with the                            patient. Return to normal activities tomorrow.                            Written discharge instructions were provided to the                            patient.                           - Resume Plavix  (clopidogrel ) at prior dose today.                           - Hydrocortisone   cream for hemorrhoids                           - If bleeding issues persist and seem like source                            other than hemorrhoids would consider CT or would                            need more aggressive prep and repeat exam Lupita FORBES Commander, MD 09/07/2023 11:37:56 AM This report has been signed electronically.

## 2023-09-07 NOTE — Progress Notes (Signed)
 History and Physical Interval Note:  09/07/2023 11:05 AM  [name] [name]  has presented today for endoscopic procedure(s), with the diagnosis of  Encounter Diagnosis  Name Primary?   Rectal mass Yes  .  The various methods of evaluation and treatment have been discussed with the patient and/or family. After consideration of risks, benefits and other options for treatment, the patient has consented to  the endoscopic procedure(s).   The patient's history has been reviewed, patient examined, no change in status except some weeping LE edema, stable for endoscopic procedure(s).  I have reviewed the patient's chart and labs.  Questions were answered to the patient's satisfaction.     Lupita CHARLENA Commander, MD, [place]

## 2023-09-10 ENCOUNTER — Other Ambulatory Visit (HOSPITAL_COMMUNITY)

## 2023-09-10 ENCOUNTER — Telehealth: Payer: Self-pay

## 2023-09-10 DIAGNOSIS — I5032 Chronic diastolic (congestive) heart failure: Secondary | ICD-10-CM | POA: Diagnosis not present

## 2023-09-10 NOTE — Telephone Encounter (Signed)
 No answer after follow up call. Voice message left.

## 2023-09-17 ENCOUNTER — Other Ambulatory Visit (HOSPITAL_COMMUNITY): Payer: Self-pay | Admitting: Cardiology

## 2023-10-18 DIAGNOSIS — D6869 Other thrombophilia: Secondary | ICD-10-CM | POA: Diagnosis not present

## 2023-10-18 DIAGNOSIS — I2510 Atherosclerotic heart disease of native coronary artery without angina pectoris: Secondary | ICD-10-CM | POA: Diagnosis not present

## 2023-11-30 DIAGNOSIS — Z125 Encounter for screening for malignant neoplasm of prostate: Secondary | ICD-10-CM | POA: Diagnosis not present

## 2023-11-30 DIAGNOSIS — Z1389 Encounter for screening for other disorder: Secondary | ICD-10-CM | POA: Diagnosis not present

## 2023-11-30 DIAGNOSIS — E7849 Other hyperlipidemia: Secondary | ICD-10-CM | POA: Diagnosis not present

## 2023-11-30 DIAGNOSIS — I130 Hypertensive heart and chronic kidney disease with heart failure and stage 1 through stage 4 chronic kidney disease, or unspecified chronic kidney disease: Secondary | ICD-10-CM | POA: Diagnosis not present

## 2023-12-04 DIAGNOSIS — I130 Hypertensive heart and chronic kidney disease with heart failure and stage 1 through stage 4 chronic kidney disease, or unspecified chronic kidney disease: Secondary | ICD-10-CM | POA: Diagnosis not present

## 2023-12-04 DIAGNOSIS — Z0000 Encounter for general adult medical examination without abnormal findings: Secondary | ICD-10-CM | POA: Diagnosis not present

## 2023-12-04 DIAGNOSIS — Z1339 Encounter for screening examination for other mental health and behavioral disorders: Secondary | ICD-10-CM | POA: Diagnosis not present

## 2023-12-04 DIAGNOSIS — Z1331 Encounter for screening for depression: Secondary | ICD-10-CM | POA: Diagnosis not present

## 2023-12-10 ENCOUNTER — Other Ambulatory Visit (HOSPITAL_COMMUNITY): Payer: Self-pay | Admitting: Cardiology

## 2023-12-18 ENCOUNTER — Ambulatory Visit: Admitting: Neurology

## 2023-12-18 ENCOUNTER — Encounter: Payer: Self-pay | Admitting: Neurology

## 2023-12-18 ENCOUNTER — Other Ambulatory Visit (HOSPITAL_COMMUNITY): Payer: Self-pay | Admitting: Cardiology

## 2023-12-18 VITALS — BP 122/58 | HR 55 | Ht 66.0 in | Wt 138.0 lb

## 2023-12-18 DIAGNOSIS — G4733 Obstructive sleep apnea (adult) (pediatric): Secondary | ICD-10-CM

## 2023-12-18 DIAGNOSIS — Z789 Other specified health status: Secondary | ICD-10-CM

## 2023-12-18 DIAGNOSIS — R413 Other amnesia: Secondary | ICD-10-CM | POA: Diagnosis not present
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
## 2023-12-18 NOTE — Patient Instructions (Addendum)
 It was nice to meet you today.  You have complaints of memory loss: memory loss or changes in cognitive function can have many reasons and does not always mean you have dementia.  There are several conditions and situations that can contribute to subjective or objective memory loss.  These factors include: depression, stress, sleep deprivation or poor sleep from insomnia or sleep apnea, dehydration, fluctuation in blood sugar values, thyroid  or electrolyte dysfunction, medication effects from sedating medications or narcotic pain medication for example and certain vitamin deficiencies such as vitamin B12 deficiency, and anemia. Dementia can be caused by stroke, brain atherosclerosis or brain vascular disease due to vascular risk factors (smoking, high blood pressure, high cholesterol, obesity and uncontrolled diabetes), certain degenerative brain disorders (including Parkinson's disease and Multiple sclerosis) and by Alzheimer's disease or other, more rare and sometimes hereditary causes.   Here is what I would recommend:   Blood work (was recently done with PCP). Please drop off a copy of the results, if you have it or we will ask for blood test results from your PCP office. We will do a brain scan, called MRI and call you with the test results. We will have to schedule you for this on a separate date. This test requires authorization from your insurance, and we will take care of the insurance process. I recommend you avoid alcohol  altogether.  Limit caffeine to 1-2 servings/day.  Increase water intake to 6-8 cups/day. Follow up with your cardiologist to make sure, your CPAP is working well and that the settings are right.  We will keep you posted as to your test results by phone call for now.  We will plan a follow-up after testing.

## 2023-12-19 ENCOUNTER — Telehealth: Payer: Self-pay | Admitting: Neurology

## 2023-12-19 NOTE — Telephone Encounter (Signed)
MRI order sent to [facility] [phone]

## 2024-01-04 DIAGNOSIS — R42 Dizziness and giddiness: Secondary | ICD-10-CM | POA: Diagnosis not present

## 2024-01-04 DIAGNOSIS — R9431 Abnormal electrocardiogram [ECG] [EKG]: Secondary | ICD-10-CM | POA: Diagnosis not present

## 2024-01-04 DIAGNOSIS — R6889 Other general symptoms and signs: Secondary | ICD-10-CM | POA: Diagnosis not present

## 2024-01-04 DIAGNOSIS — W19XXXA Unspecified fall, initial encounter: Secondary | ICD-10-CM | POA: Diagnosis not present

## 2024-01-17 ENCOUNTER — Ambulatory Visit (HOSPITAL_COMMUNITY): Payer: Self-pay | Admitting: Cardiology

## 2024-01-17 ENCOUNTER — Ambulatory Visit (HOSPITAL_COMMUNITY)
Admission: RE | Admit: 2024-01-17 | Discharge: 2024-01-17 | Disposition: A | Discharge status: Home / Self Care | Source: Ambulatory Visit | Attending: Cardiology | Admitting: Cardiology

## 2024-01-17 ENCOUNTER — Encounter (HOSPITAL_COMMUNITY): Payer: Self-pay

## 2024-01-17 VITALS — BP 112/58 | HR 54 | Ht 66.0 in | Wt 134.8 lb

## 2024-01-17 DIAGNOSIS — Z951 Presence of aortocoronary bypass graft: Secondary | ICD-10-CM | POA: Diagnosis not present

## 2024-01-17 DIAGNOSIS — I5032 Chronic diastolic (congestive) heart failure: Secondary | ICD-10-CM

## 2024-01-17 DIAGNOSIS — Z79899 Other long term (current) drug therapy: Secondary | ICD-10-CM | POA: Diagnosis not present

## 2024-01-17 DIAGNOSIS — I5022 Chronic systolic (congestive) heart failure: Secondary | ICD-10-CM | POA: Diagnosis not present

## 2024-01-17 DIAGNOSIS — I4819 Other persistent atrial fibrillation: Secondary | ICD-10-CM | POA: Diagnosis not present

## 2024-01-17 DIAGNOSIS — E785 Hyperlipidemia, unspecified: Secondary | ICD-10-CM | POA: Diagnosis not present

## 2024-01-17 DIAGNOSIS — I252 Old myocardial infarction: Secondary | ICD-10-CM | POA: Insufficient documentation

## 2024-01-17 DIAGNOSIS — Z8674 Personal history of sudden cardiac arrest: Secondary | ICD-10-CM | POA: Diagnosis not present

## 2024-01-17 DIAGNOSIS — G4733 Obstructive sleep apnea (adult) (pediatric): Secondary | ICD-10-CM | POA: Diagnosis not present

## 2024-01-17 DIAGNOSIS — Z8679 Personal history of other diseases of the circulatory system: Secondary | ICD-10-CM | POA: Diagnosis not present

## 2024-01-17 DIAGNOSIS — I083 Combined rheumatic disorders of mitral, aortic and tricuspid valves: Secondary | ICD-10-CM | POA: Insufficient documentation

## 2024-01-17 DIAGNOSIS — N183 Chronic kidney disease, stage 3 unspecified: Secondary | ICD-10-CM | POA: Insufficient documentation

## 2024-01-17 DIAGNOSIS — I129 Hypertensive chronic kidney disease with stage 1 through stage 4 chronic kidney disease, or unspecified chronic kidney disease: Secondary | ICD-10-CM | POA: Insufficient documentation

## 2024-01-17 DIAGNOSIS — I493 Ventricular premature depolarization: Secondary | ICD-10-CM | POA: Diagnosis not present

## 2024-01-17 DIAGNOSIS — I2510 Atherosclerotic heart disease of native coronary artery without angina pectoris: Secondary | ICD-10-CM | POA: Insufficient documentation

## 2024-01-17 LAB — CBC
HCT: 33.9 % — ABNORMAL LOW (ref 39.0–52.0)
Hemoglobin: 10.8 g/dL — ABNORMAL LOW (ref 13.0–17.0)
MCH: 31 pg (ref 26.0–34.0)
MCHC: 31.9 g/dL (ref 30.0–36.0)
MCV: 97.4 fL (ref 80.0–100.0)
Platelets: 219 K/uL (ref 150–400)
RBC: 3.48 MIL/uL — ABNORMAL LOW (ref 4.22–5.81)
RDW: 14.4 % (ref 11.5–15.5)
WBC: 6.2 K/uL (ref 4.0–10.5)
nRBC: 0.3 % — ABNORMAL HIGH (ref 0.0–0.2)

## 2024-01-17 LAB — COMPREHENSIVE METABOLIC PANEL WITH GFR
ALT: 17 U/L (ref 0–44)
AST: 28 U/L (ref 15–41)
Albumin: 3.8 g/dL (ref 3.5–5.0)
Alkaline Phosphatase: 74 U/L (ref 38–126)
Anion gap: 10 (ref 5–15)
BUN: 40 mg/dL — ABNORMAL HIGH (ref 8–23)
CO2: 17 mmol/L — ABNORMAL LOW (ref 22–32)
Calcium: 9.4 mg/dL (ref 8.9–10.3)
Chloride: 108 mmol/L (ref 98–111)
Creatinine, Ser: 1.75 mg/dL — ABNORMAL HIGH (ref 0.61–1.24)
GFR, Estimated: 36 mL/min — ABNORMAL LOW (ref >60)
Glucose, Bld: 96 mg/dL (ref 70–99)
Potassium: 5.4 mmol/L — ABNORMAL HIGH (ref 3.5–5.1)
Sodium: 135 mmol/L (ref 135–145)
Total Bilirubin: 0.6 mg/dL (ref 0.0–1.2)
Total Protein: 7 g/dL (ref 6.5–8.1)

## 2024-01-17 LAB — LIPID PANEL
Cholesterol: 122 mg/dL (ref 0–200)
HDL: 55 mg/dL (ref >40)
LDL Cholesterol: 54 mg/dL (ref 0–99)
Total CHOL/HDL Ratio: 2.2 ratio
Triglycerides: 66 mg/dL (ref <150)
VLDL: 13 mg/dL (ref 0–40)

## 2024-01-17 NOTE — Patient Instructions (Signed)
 Medication Changes:  No Changes In Medications at this time.   Lab Work:  Labs done today, your results will be available in MyChart, we will contact you for abnormal readings.  Special Instructions // Education:  Atrial Fibrillation Atrial fibrillation (AFib) is a type of heartbeat that is irregular or fast. If you have AFib, your heart beats without any order. This makes it hard for your heart to pump blood in a normal way. AFib may come and go, or it may become a long-lasting problem. If AFib is not treated, it can put you at higher risk for stroke, heart failure, and other heart problems. What are the causes? AFib may be caused by diseases that damage the heart's electrical system. They include: High blood pressure. Heart failure. Heart valve diseases. Heart surgery. Diabetes. Thyroid  disease. Kidney disease. Lung diseases, such as pneumonia or COPD. Sleep apnea. Sometimes the cause is not known. What increases the risk? You are more likely to develop AFib if: You are older. You exercise often and very hard. You have a family history of AFib. You are male. You are Caucasian. You are overweight. You smoke. You drink a lot of alcohol . What are the signs or symptoms? Common symptoms of this condition include: A feeling that your heart is beating very fast. Chest pain or discomfort. Feeling short of breath. Suddenly feeling light-headed or weak. Getting tired easily during activity. Fainting. Sweating. In some cases, there are no symptoms. How is this treated? Medicines to: Prevent blood clots. Treat heart rate or heart rhythm problems. Using devices, such as a pacemaker, to correct heart rhythm problems. Doing surgery to remove the part of the heart that sends bad signals. Closing an area where clots can form in the heart (left atrial appendage). In some cases, your doctor will treat other underlying conditions. Follow these instructions at home: Medicines Take  over-the-counter and prescription medicines only as told by your doctor. Do not take any new medicines without first talking to your doctor. If you are taking blood thinners: Talk with your doctor before taking aspirin  or NSAIDs, such as ibuprofen. Take your medicines as told. Take them at the same time each day. Do not do things that could hurt or bruise you. Be careful to avoid falls. Wear an alert bracelet or carry a card that says you take blood thinners. Lifestyle Do not smoke or use any products that contain nicotine or tobacco. If you need help quitting, ask your doctor. Eat heart-healthy foods. Talk with your doctor about the right eating plan for you. Exercise regularly as told by your doctor. Do not drink alcohol . Lose weight if you are overweight. General instructions If you have sleep apnea, treat it as told by your doctor. Do not use diet pills unless your doctor says they are safe for you. Diet pills may make heart problems worse. Keep all follow-up visits. Your doctor will check your heart rate and rhythm regularly. Contact a doctor if: You notice a change in the speed, rhythm, or strength of your heartbeat. You are taking a blood-thinning medicine and you get more bruising. You get tired more easily when you move or exercise. You have a sudden change in weight. Get help right away if:  You have pain in your chest. You have trouble breathing. You have side effects of blood thinners, such as blood in your vomit, poop (stool), or pee (urine), or bleeding that cannot stop. You have any signs of a stroke. BE FAST is an easy  way to remember the main warning signs: B - Balance. Dizziness, sudden trouble walking, or loss of balance. E - Eyes. Trouble seeing or a change in how you see. F - Face. Sudden weakness or loss of feeling in the face. The face or eyelid may droop on one side. A - Arms.Weakness or loss of feeling in an arm. This happens suddenly and usually on one side  of the body. S - Speech. Sudden trouble speaking, slurred speech, or trouble understanding what people say. T - Time.Time to call emergency services. Write down what time symptoms started. You have other signs of a stroke, such as: A sudden, very bad headache with no known cause. Feeling like you may vomit (nausea). Vomiting. A seizure. These symptoms may be an emergency. Get help right away. Call 911. Do not wait to see if the symptoms will go away. Do not drive yourself to the hospital. This information is not intended to replace advice given to you by your health care provider. Make sure you discuss any questions you have with your health care provider. Document Revised: 11/02/2021 Document Reviewed: 11/02/2021 Elsevier Patient Education  2024 [employer] Inc[employer]  Follow-Up in: 4 WEEKS AS SCHEDULED WITH APP CLINIC  At the [facility], you and your health needs are our priority. We have a designated team specialized in the treatment of Heart Failure. This Care Team includes your primary Heart Failure Specialized Cardiologist (physician), Advanced Practice Providers (APPs- Physician Assistants and Nurse Practitioners), and Pharmacist who all work together to provide you with the care you need, when you need it.   You may see any of the following providers on your designated Care Team at your next follow up:  Dr. Toribio Fuel Dr. Ezra Shuck Dr. Odis Brownie Greig Mosses, NP Caffie Shed, [place] [facility] [place], [place] Beckey Coe, NP Jordan Lee, NP Tinnie Redman, PharmD   Please be sure to bring in all your medications bottles to every appointment.   Need to Contact Us :  If you have any questions or concerns before your next appointment please send us  a message through [place] or call our office at [phone].    TO LEAVE A MESSAGE FOR THE NURSE SELECT OPTION 2, PLEASE LEAVE A MESSAGE INCLUDING: YOUR NAME DATE OF BIRTH CALL BACK NUMBER REASON FOR  CALL**this is important as we prioritize the call backs  YOU WILL RECEIVE A CALL BACK THE SAME DAY AS LONG AS YOU CALL BEFORE 4:00 PM

## 2024-01-17 NOTE — Progress Notes (Signed)
 Patient ID: [name] [name], male   DOB: [birth year], 89 y.o.   MRN: [id number] PCP: Dr. Landy Cardiology: Dr. Rolan  89 y.o.with history of CAD s/p CABG and mitral regurgitation s/p mitral valve repair presents for followup of CAD.  Echo in 5/20 showed EF 50-55% with inferior hypokinesis, mild AS, mild-moderate AI, s/p MV repair with stable mitral valve.    In 6/20, he had left TKR.  Cardiology was consulted while he was in the hospital for ?atrial fibrillation, but this was thought to actually be frequent PACs versus MAT.    At a prior appointment, he reported significant fatigue and dyspnea post-op. CTA chest showed no PE or other active disease.  Cardiolite was done in 7/20 showing EF 61%, small fixed basal anterior defect, no ischemia.  I had him increase Lasix  to 40 mg daily due to concern for possible diastolic CHF.  He wore a 30 day monitor which showed short NSVT runs, no atrial fibrillation.  Sleep study showed severe OSA and he was started on CPAP.  Zio patch again in 12/20 showed 6.3% PVCs, Toprol  XL was started.   Echo in 12/21 showed EF 50-55%, septal-lateral dyssynchrony, mild LVH, normal RV, s/p MV repair with trivial MR and probably mild mitral stenosis, moderate AI, mild AS mean gradient 12 mmHg and AVA 1.72 cm^2.   Echo in 1/23 showed EF 50-55%, mild RV enlargement with moderately decreased RV systolic function, s/p MV repair with mean gradient 4 mmHg and mild MR, moderate-severe TR, mild-moderate AI and AS with mean gradient 17 mmHg, IVC normal.   Echo in 2/24 showed EF 45-50%, s/p MV repair with mild MR and no mitral stenosis, mild AS mean gradient 13 mmHg with AVA 1.67 cm^2, severe TR, mild RV enlargement with mildly decreased RV systolic function.   Echo 3/25 showed EF 60-65%, normal RV, s/p MV repair with mild MR and no mitral stenosis, moderate TR, moderate AS mean gradient 21 mmHg with AVA VTI 1.23 cm2      Today he returns for an acute visit with daughter. He was seen at an  urgent care last wk after a mechanical fall (was out in his yard playing w/ his dog and lost footing) and EKG was done and demonstrated new atrial fibrillation 108 bpm w/ incomplete RBBB. He was instructed to f/u w/ primary cardiology team.   Repeat EKG today shows persistent Afib, rate controlled 83 bpm. He is asymptomatic. Denies palpitations. He denies CP. No resting dyspnea. Reports stable NYHA Class II symptoms. No LEE. ReDs normal at 25%.   He lives by himself but family checks on him. He gets around w/o an assistive device. He has had several falls. Also past h/o hemorrhoidal bleeding.    ReDs reading: 25 %, normal   ECG (personally reviewed): Afib 83 bpm, incomplete RBBB   Labs (12/22): LDL 82, K 4.4, creatinine 8.67 Labs (2/24): hgb 10.3, BNP 157, K 5, creatinine 1.47 Labs (2/25): LDL 53 Labs (7/25): K 4.4, creatinine 1.25  PMH: 1. CAD: Acute MI in 2006 with ventricular fibrillation arrest.  Patient had 1st generation DES to LCx.  Patient then had CABG 2009 with LIMA-OM and SVG-PDA.  Plan has been to continue DAPT long-term because of 1st generation DES.  - Cardiolite (7/20): EF 61%, small fixed basal anterior defect, no ischemia. 2. Nephrolithiasis 3. HTN 4. Hyperlipidemia 5. Diverticulosis 6. Aortic stenosis: Mild on 7/20 echo. 7. Mitral valve prolapse with severe MR: MV repair in 2009.  Echo (2/14)  with EF 50-55%, inferior HK, mild AS with mean gradient 12 mmHg, s/p MV repair with mild MR and no significant MS (mean gradient 3 mmHg), mild RV dilation, PA systolic pressure 37 mmHg.  Echo (3/16) with EF 50-55%, mild focal basal septal hypertrophy, mild aortic stenosis mean gradient 14 mmHg, s/p MV repair with mild MR and no MS, PASP 38 mmHg.  - Echo (5/20): EF 50-55% with inferior hypokinesis, mild AS, mild-moderate AI, s/p MV repair with stable mitral valve. - Echo (12/21): EF 50-55%, septal-lateral dyssynchrony, mild LVH, nomral RV, s/p MV repair with trivial MR and probably  mild mitral stenosis, moderate AI, mild AS mean gradient 12 mmHg and AVA 1.72 cm^2.  - Echo (1/23): EF 50-55%, mild RV enlargement with moderately decreased RV systolic function, s/p MV repair with mean gradient 4 mmHg and mild MR, moderate-severe TR, mild-moderate AI and AS with mean gradient 17 mmHg, IVC normal.  - Echo (2/24): EF 45-50%, s/p MV repair with mild MR and no mitral stenosis, mild AS mean gradient 13 mmHg with AVA 1.67 cm^2, severe TR, mild RV enlargement with mildly decreased RV systolic function.  - Echo 3/25 showed EF 60-65%, normal RV, s/p MV repair with mild MR and no mitral stenosis, moderate TR, moderate AS mean gradient 21 mmHg with AVA VTI 1.23 cm2  8. Thyroid  nodule: Biopsy with benign follicular nodule in 2018. 9. BPH  10. OSA: Uses CPAP.  11. Palpitations: 30 day monitor (8/20) showed short NSVT runs, no atrial fibrillation.  - 12/20 Zio patch: 6.3% PVCs 12. Aortic valve disorder: 2/24 with mild aortic stenosis. Moderate AS on 3/25 echo  13. CKD: Stage 3.  14. Tricuspid regurgitation: Severe on 2/24 echo. 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   FH: HTN  ROS: All systems reviewed and negative except as per HPI.   Current Outpatient Medications  Medication Sig Dispense Refill   aspirin  EC 81 MG tablet Take 1 tablet (81 mg total) by mouth daily. 90 tablet 3   clopidogrel  (PLAVIX ) 75 MG tablet TAKE ONE TABLET BY MOUTH ONCE DAILY 105 tablet 0   ferrous sulfate  325 (65 FE) MG EC tablet Take 325 mg by mouth 2 (two) times daily.     furosemide  (LASIX ) 20 MG tablet Take 1 tablet (20 mg total) by mouth daily. 90 tablet 3   hydrocortisone  (ANUSOL -HC) 2.5 % rectal cream Place 1 Application rectally 2 (two) times daily. 30 g 1   lisinopril  (ZESTRIL ) 5 MG tablet TAKE ONE TABLET BY MOUTH EVERY DAY 84 tablet 0   metoprolol  succinate (TOPROL -XL) 25 MG 24 hr tablet TAKE 1/2 TABLET BY MOUTH EVERY DAY 42 tablet 0    Multiple Vitamin (MULTIVITAMIN) tablet Take 1 tablet by mouth daily.       potassium chloride  SA (KLOR-CON  M) 20 MEQ tablet Take 2 tablets (40 mEq total) by mouth daily. 180 tablet 3   simvastatin  (ZOCOR ) 40 MG tablet TAKE ONE TABLET BY MOUTH DAILY 84 tablet 0   Tamsulosin  HCl (FLOMAX ) 0.4 MG CAPS Take 0.4 mg by mouth daily.      No current facility-administered medications for this visit.   Wt Readings from Last 3 Encounters:  12/18/23 62.6 kg (138 lb)  09/07/23 64.4 kg (142 lb)  08/29/23 65 kg (143 lb 3.2 oz)    Vitals:   01/17/24 1516  BP: (!) 112/58  Pulse: (!) 54  SpO2: 98%   GENERAL: frail,  elderly male, NAD Lungs- clear  CARDIAC:  JVP not elevated          Irregularly irregular rate and rhythm. 2/6 SEM.  ABDOMEN: Soft, non-tender, non-distended.  EXTREMITIES: Warm and well perfused. No LEE  NEUROLOGIC: No obvious FND   Assessment/Plan: 1. CAD: S/p 1st generation DES to LCx and later CABG. Cardiolite was done in 7/20, this showed no ischemia.  He denies CP.  - no longer on ASA. Continues on Plavix  75 mg daily  - continue simvastatin  40 mg 2. S/p MV repair: Echo in 2/24 showed stable MV repair with mean gradient 2 mmHg and mild MR. Stable on echo 3/25, mean gradient 3 mmHg. 3. Aortic valve disorder: Echo in 2/24 with mild AS.  Now with moderate AS on echo 3/25, mean gradient 21 mmHg 4. Hyperlipidemia, LDL Goal < 55:  - on simvastatin  40 mg - check LP and HFTs today  5. HTN: controlled on current regimen  6. Chronic HF with mid range EF: Echo in 2/24 with EF 45-50%, s/p MV repair with mild MR and no mitral stenosis, mild AS mean gradient 13 mmHg with AVA 1.67 cm^2, severe TR, mild RV enlargement with mildly decreased RV systolic function.  Echo 3/25 showed  EF 60-65%, normal RV, s/p MV repair with mild MR and no mitral stenosis, moderate TR, moderate AS mean gradient 21 mmHg with AVA VTI 1.23 cm2. Stable NYHA class II. Euvolemic on exam and ReDs, 25% - continue Lasix  20 mg  daily  - Continue Toprol  XL 12.5 mg daily - Continue lisinopril  5 mg daily - Check BMP today  7. PVCs: Now on a beta blocker, with no palpitations. No PVCs on ECG today. 8. OSA: Continue CPAP.   9. Atrial Fibrillation: new diagnosis, persistent. Noted on EKG at [facility] 11/7 and present on today's EKG, Rate controlled in the 80s and asymptomatic. We discussed risk and benefit of starting anticoagulation. I think he would be high risk for bleeding given age/fragility and high fall risk, also previous h/o GIB. He lives home along and has had numerous falls and does not use an assistive device. I also suspect some dementia based on interaction today. His daughter is strongly considering having him move in with her. At this time, will not start anticoagulation but will discuss further w/ Dr. Rolan.  - Continue rate control for now w/ Toprol  XL.   Not candidate for rhythm control w/o a/c  10. Tricuspid regurgitation: Severe on 2/24 echo but minimal symptoms.  Repeat echo 3/25 showed moderate TR. - Would manage conservatively.  - continue diuresis per above  11. Rectal bleeding/Rectal Mass/Anemia: Had sigmoidoscopy by Dr. Maryfrances 7/25, + for external and internal hemorrhoids, no palpable mass on digital exam   F/u in 4 wks w/ APP   Caffie Shed PA-C  01/17/2024

## 2024-01-17 NOTE — Progress Notes (Signed)
 ReDS Vest / Clip - 01/17/24 1600       ReDS Vest / Clip   Station Marker C    Ruler Value 30    ReDS Value Range Low volume    ReDS Actual Value 25

## 2024-01-22 ENCOUNTER — Telehealth (HOSPITAL_COMMUNITY): Payer: Self-pay

## 2024-01-22 NOTE — Telephone Encounter (Signed)
-----   Message from Tabiona sent at 01/18/2024 11:33 AM EST ----- Can you relay this to the patient's daughter. We will not start anticoagulation given safety concerns. If he moves in with daughter then we can later reconsider. ----- Message ----- From: Rolan Ezra RAMAN, MD Sent: 01/17/2024  11:14 PM EST To: Caffie CHRISTELLA Shed, PA-C  Sounds like it may be safest not to anticoagulate him.  Can consider if he moves in with his daughter. ----- Message ----- From: Shed Caffie CHRISTELLA, PA-C Sent: 01/17/2024   4:34 PM EST To: Ezra RAMAN Rolan, MD; Emer CHRISTELLA Sergeant, RN  Pt seen today, new Afib. Persistent but rate controlled and asymptomatic. I think he would be very high risk for bleeding given fall history and previous h/o GIB. We decided to hold off on starting a/c today but wanted to see if you felt strongly about starting. If so, would you stop Plavix ?

## 2024-01-22 NOTE — Telephone Encounter (Signed)
 Called patients daughter and made her aware of the below. Patients daughter verbalized understanding.

## 2024-01-23 ENCOUNTER — Ambulatory Visit (HOSPITAL_COMMUNITY)
Admission: RE | Admit: 2024-01-23 | Discharge: 2024-01-23 | Disposition: A | Discharge status: Home / Self Care | Source: Ambulatory Visit | Attending: Cardiology | Admitting: Cardiology

## 2024-01-23 ENCOUNTER — Ambulatory Visit (HOSPITAL_COMMUNITY): Payer: Self-pay | Admitting: Cardiology

## 2024-01-23 DIAGNOSIS — I5032 Chronic diastolic (congestive) heart failure: Secondary | ICD-10-CM | POA: Insufficient documentation

## 2024-01-23 LAB — BASIC METABOLIC PANEL WITH GFR
Anion gap: 8 (ref 5–15)
BUN: 36 mg/dL — ABNORMAL HIGH (ref 8–23)
CO2: 21 mmol/L — ABNORMAL LOW (ref 22–32)
Calcium: 9.2 mg/dL (ref 8.9–10.3)
Chloride: 112 mmol/L — ABNORMAL HIGH (ref 98–111)
Creatinine, Ser: 1.43 mg/dL — ABNORMAL HIGH (ref 0.61–1.24)
GFR, Estimated: 46 mL/min — ABNORMAL LOW (ref >60)
Glucose, Bld: 99 mg/dL (ref 70–99)
Potassium: 4.3 mmol/L (ref 3.5–5.1)
Sodium: 141 mmol/L (ref 135–145)

## 2024-01-29 NOTE — Telephone Encounter (Signed)
 Daughter aware.

## 2024-02-05 DIAGNOSIS — K080 Exfoliation of teeth due to systemic causes: Secondary | ICD-10-CM | POA: Diagnosis not present

## 2024-02-13 NOTE — Progress Notes (Signed)
 Patient ID: [name] [name], male   DOB: Mar 20, 1928, 89 y.o.   MRN: [id number] PCP: Dr. Landy Cardiology: Dr. Rolan  89 y.o.with history of CAD s/p CABG and mitral regurgitation s/p mitral valve repair presents for followup of CAD.  Echo in 5/20 showed EF 50-55% with inferior hypokinesis, mild AS, mild-moderate AI, s/p MV repair with stable mitral valve.    In 6/20, he had left TKR.  Cardiology was consulted while he was in the hospital for ?atrial fibrillation, but this was thought to actually be frequent PACs versus MAT.    At a prior appointment, he reported significant fatigue and dyspnea post-op. CTA chest showed no PE or other active disease.  Cardiolite was done in 7/20 showing EF 61%, small fixed basal anterior defect, no ischemia.  I had him increase Lasix  to 40 mg daily due to concern for possible diastolic CHF.  He wore a 30 day monitor which showed short NSVT runs, no atrial fibrillation.  Sleep study showed severe OSA and he was started on CPAP.  Zio patch again in 12/20 showed 6.3% PVCs, Toprol  XL was started.   Echo in 12/21 showed EF 50-55%, septal-lateral dyssynchrony, mild LVH, normal RV, s/p MV repair with trivial MR and probably mild mitral stenosis, moderate AI, mild AS mean gradient 12 mmHg and AVA 1.72 cm^2.   Echo in 1/23 showed EF 50-55%, mild RV enlargement with moderately decreased RV systolic function, s/p MV repair with mean gradient 4 mmHg and mild MR, moderate-severe TR, mild-moderate AI and AS with mean gradient 17 mmHg, IVC normal.   Echo in 2/24 showed EF 45-50%, s/p MV repair with mild MR and no mitral stenosis, mild AS mean gradient 13 mmHg with AVA 1.67 cm^2, severe TR, mild RV enlargement with mildly decreased RV systolic function.   Echo 3/25 showed EF 60-65%, normal RV, s/p MV repair with mild MR and no mitral stenosis, moderate TR, moderate AS mean gradient 21 mmHg with AVA VTI 1.23 cm2      Today he returns for an acute visit with daughter. He was seen at an  urgent care last wk after a mechanical fall (was out in his yard playing w/ his dog and lost footing) and EKG was done and demonstrated new atrial fibrillation 108 bpm w/ incomplete RBBB. He was instructed to f/u w/ primary cardiology team.   Repeat EKG today shows persistent Afib, rate controlled 83 bpm. He is asymptomatic. Denies palpitations. He denies CP. No resting dyspnea. Reports stable NYHA Class II symptoms. No LEE. ReDs normal at 25%.   He lives by himself but family checks on him. He gets around w/o an assistive device. He has had several falls. Also past h/o hemorrhoidal bleeding.    ReDs reading: 25 %, normal   ECG (personally reviewed): Afib 83 bpm, incomplete RBBB   Labs (12/22): LDL 82, K 4.4, creatinine 8.67 Labs (2/24): hgb 10.3, BNP 157, K 5, creatinine 1.47 Labs (2/25): LDL 53 Labs (7/25): K 4.4, creatinine 1.25  PMH: 1. CAD: Acute MI in 2006 with ventricular fibrillation arrest.  Patient had 1st generation DES to LCx.  Patient then had CABG 2009 with LIMA-OM and SVG-PDA.  Plan has been to continue DAPT long-term because of 1st generation DES.  - Cardiolite (7/20): EF 61%, small fixed basal anterior defect, no ischemia. 2. Nephrolithiasis 3. HTN 4. Hyperlipidemia 5. Diverticulosis 6. Aortic stenosis: Mild on 7/20 echo. 7. Mitral valve prolapse with severe MR: MV repair in 2009.  Echo (2/14)  with EF 50-55%, inferior HK, mild AS with mean gradient 12 mmHg, s/p MV repair with mild MR and no significant MS (mean gradient 3 mmHg), mild RV dilation, PA systolic pressure 37 mmHg.  Echo (3/16) with EF 50-55%, mild focal basal septal hypertrophy, mild aortic stenosis mean gradient 14 mmHg, s/p MV repair with mild MR and no MS, PASP 38 mmHg.  - Echo (5/20): EF 50-55% with inferior hypokinesis, mild AS, mild-moderate AI, s/p MV repair with stable mitral valve. - Echo (12/21): EF 50-55%, septal-lateral dyssynchrony, mild LVH, nomral RV, s/p MV repair with trivial MR and probably  mild mitral stenosis, moderate AI, mild AS mean gradient 12 mmHg and AVA 1.72 cm^2.  - Echo (1/23): EF 50-55%, mild RV enlargement with moderately decreased RV systolic function, s/p MV repair with mean gradient 4 mmHg and mild MR, moderate-severe TR, mild-moderate AI and AS with mean gradient 17 mmHg, IVC normal.  - Echo (2/24): EF 45-50%, s/p MV repair with mild MR and no mitral stenosis, mild AS mean gradient 13 mmHg with AVA 1.67 cm^2, severe TR, mild RV enlargement with mildly decreased RV systolic function.  - Echo 3/25 showed EF 60-65%, normal RV, s/p MV repair with mild MR and no mitral stenosis, moderate TR, moderate AS mean gradient 21 mmHg with AVA VTI 1.23 cm2  8. Thyroid  nodule: Biopsy with benign follicular nodule in 2018. 9. BPH  10. OSA: Uses CPAP.  11. Palpitations: 30 day monitor (8/20) showed short NSVT runs, no atrial fibrillation.  - 12/20 Zio patch: 6.3% PVCs 12. Aortic valve disorder: 2/24 with mild aortic stenosis. Moderate AS on 3/25 echo  13. CKD: Stage 3.  14. Tricuspid regurgitation: Severe on 2/24 echo. 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   FH: HTN  ROS: All systems reviewed and negative except as per HPI.   Current Outpatient Medications  Medication Sig Dispense Refill   aspirin  EC 81 MG tablet Take 1 tablet (81 mg total) by mouth daily. (Patient not taking: Reported on 01/17/2024) 90 tablet 3   clopidogrel  (PLAVIX ) 75 MG tablet TAKE ONE TABLET BY MOUTH ONCE DAILY 105 tablet 0   ferrous sulfate  325 (65 FE) MG EC tablet Take 325 mg by mouth 2 (two) times daily.     furosemide  (LASIX ) 20 MG tablet Take 1 tablet (20 mg total) by mouth daily. 90 tablet 3   hydrocortisone  (ANUSOL -HC) 2.5 % rectal cream Place 1 Application rectally 2 (two) times daily. 30 g 1   lisinopril  (ZESTRIL ) 5 MG tablet TAKE ONE TABLET BY MOUTH EVERY DAY 84 tablet 0   metoprolol  succinate (TOPROL -XL) 25 MG 24 hr tablet TAKE 1/2  TABLET BY MOUTH EVERY DAY 42 tablet 0   Multiple Vitamin (MULTIVITAMIN) tablet Take 1 tablet by mouth daily.       potassium chloride  SA (KLOR-CON  M) 20 MEQ tablet Take 2 tablets (40 mEq total) by mouth daily. 180 tablet 3   simvastatin  (ZOCOR ) 40 MG tablet TAKE ONE TABLET BY MOUTH DAILY 84 tablet 0   Tamsulosin  HCl (FLOMAX ) 0.4 MG CAPS Take 0.4 mg by mouth daily.      No current facility-administered medications for this visit.   Wt Readings from Last 3 Encounters:  01/17/24 61.1 kg (134 lb 12.8 oz)  12/18/23 62.6 kg (138 lb)  09/07/23 64.4 kg (142 lb)    There were no vitals filed for this visit.  GENERAL: frail, elderly male, NAD  Lungs- clear  CARDIAC:  JVP not elevated          Irregularly irregular rate and rhythm. 2/6 SEM.  ABDOMEN: Soft, non-tender, non-distended.  EXTREMITIES: Warm and well perfused. No LEE  NEUROLOGIC: No obvious FND   Assessment/Plan: 1. CAD: S/p 1st generation DES to LCx and later CABG. Cardiolite was done in 7/20, this showed no ischemia.  He denies CP.  - no longer on ASA. Continues on Plavix  75 mg daily  - continue simvastatin  40 mg 2. S/p MV repair: Echo in 2/24 showed stable MV repair with mean gradient 2 mmHg and mild MR. Stable on echo 3/25, mean gradient 3 mmHg. 3. Aortic valve disorder: Echo in 2/24 with mild AS.  Now with moderate AS on echo 3/25, mean gradient 21 mmHg 4. Hyperlipidemia, LDL Goal < 55:  - on simvastatin  40 mg - check LP and HFTs today  5. HTN: controlled on current regimen  6. Chronic HF with mid range EF: Echo in 2/24 with EF 45-50%, s/p MV repair with mild MR and no mitral stenosis, mild AS mean gradient 13 mmHg with AVA 1.67 cm^2, severe TR, mild RV enlargement with mildly decreased RV systolic function.  Echo 3/25 showed  EF 60-65%, normal RV, s/p MV repair with mild MR and no mitral stenosis, moderate TR, moderate AS mean gradient 21 mmHg with AVA VTI 1.23 cm2. Stable NYHA class II. Euvolemic on exam and ReDs, 25% -  continue Lasix  20 mg daily  - Continue Toprol  XL 12.5 mg daily - Continue lisinopril  5 mg daily - Check BMP today  7. PVCs: Now on a beta blocker, with no palpitations. No PVCs on ECG today. 8. OSA: Continue CPAP.   9. Atrial Fibrillation: new diagnosis, persistent. Not[facility] at [facility] 11/7 and present on today's EKG, Rate controlled in the 80s and asymptomatic. We discussed risk and benefit of starting anticoagulation. I think he would be high risk for bleeding given age/fragility and high fall risk, also previous h/o GIB. He lives home along and has had numerous falls and does not use an assistive device. I also suspect some dementia based on interaction today. His daughter is strongly considering having him move in with her. At this time, will not start anticoagulation but will discuss further w/ Dr. Rolan.  - Continue rate control for now w/ Toprol  XL.   Not candidate for rhythm control w/o a/c  10. Tricuspid regurgitation: Severe on 2/24 echo but minimal symptoms.  Repeat echo 3/25 showed moderate TR. - Would manage conservatively.  - continue diuresis per above  11. Rectal bleeding/Rectal Mass/Anemia: Had sigmoidoscopy by Dr. Maryfrances 7/25, + for external and internal hemorrhoids, no palpable mass on digital exam   F/u in 4 wks w/ APP   Harlene HERO [facility] PA-C  02/13/2024

## 2024-02-14 ENCOUNTER — Telehealth (HOSPITAL_COMMUNITY): Payer: Self-pay

## 2024-02-14 ENCOUNTER — Ambulatory Visit (HOSPITAL_COMMUNITY)

## 2024-02-14 NOTE — Telephone Encounter (Signed)
 Called to confirm/remind patient of their appointment at the [facility] on 02/15/24.   Appointment:   [] Confirmed  [x] Left mess   [] No answer/No voice mail  [] VM Full/unable to leave message  [] Phone not in service  And to bring in all medications and/or complete list.

## 2024-02-15 ENCOUNTER — Ambulatory Visit (HOSPITAL_COMMUNITY)
Admission: RE | Admit: 2024-02-15 | Discharge: 2024-02-15 | Disposition: A | Discharge status: Home / Self Care | Source: Ambulatory Visit | Attending: Family Medicine | Admitting: Family Medicine

## 2024-02-15 ENCOUNTER — Encounter (HOSPITAL_COMMUNITY): Payer: Self-pay

## 2024-02-15 VITALS — BP 142/69 | HR 64 | Ht 66.0 in | Wt 141.2 lb

## 2024-02-15 DIAGNOSIS — I252 Old myocardial infarction: Secondary | ICD-10-CM | POA: Insufficient documentation

## 2024-02-15 DIAGNOSIS — N183 Chronic kidney disease, stage 3 unspecified: Secondary | ICD-10-CM | POA: Diagnosis not present

## 2024-02-15 DIAGNOSIS — Z8674 Personal history of sudden cardiac arrest: Secondary | ICD-10-CM | POA: Insufficient documentation

## 2024-02-15 DIAGNOSIS — Z7902 Long term (current) use of antithrombotics/antiplatelets: Secondary | ICD-10-CM | POA: Insufficient documentation

## 2024-02-15 DIAGNOSIS — G4733 Obstructive sleep apnea (adult) (pediatric): Secondary | ICD-10-CM | POA: Insufficient documentation

## 2024-02-15 DIAGNOSIS — D649 Anemia, unspecified: Secondary | ICD-10-CM | POA: Insufficient documentation

## 2024-02-15 DIAGNOSIS — Z602 Problems related to living alone: Secondary | ICD-10-CM | POA: Diagnosis not present

## 2024-02-15 DIAGNOSIS — I2510 Atherosclerotic heart disease of native coronary artery without angina pectoris: Secondary | ICD-10-CM | POA: Insufficient documentation

## 2024-02-15 DIAGNOSIS — E785 Hyperlipidemia, unspecified: Secondary | ICD-10-CM | POA: Insufficient documentation

## 2024-02-15 DIAGNOSIS — I4819 Other persistent atrial fibrillation: Secondary | ICD-10-CM | POA: Insufficient documentation

## 2024-02-15 DIAGNOSIS — I129 Hypertensive chronic kidney disease with stage 1 through stage 4 chronic kidney disease, or unspecified chronic kidney disease: Secondary | ICD-10-CM | POA: Insufficient documentation

## 2024-02-15 DIAGNOSIS — I38 Endocarditis, valve unspecified: Secondary | ICD-10-CM

## 2024-02-15 DIAGNOSIS — I5020 Unspecified systolic (congestive) heart failure: Secondary | ICD-10-CM

## 2024-02-15 DIAGNOSIS — I493 Ventricular premature depolarization: Secondary | ICD-10-CM

## 2024-02-15 DIAGNOSIS — F0390 Unspecified dementia without behavioral disturbance: Secondary | ICD-10-CM | POA: Insufficient documentation

## 2024-02-15 DIAGNOSIS — Z7982 Long term (current) use of aspirin: Secondary | ICD-10-CM | POA: Insufficient documentation

## 2024-02-15 DIAGNOSIS — I071 Rheumatic tricuspid insufficiency: Secondary | ICD-10-CM | POA: Diagnosis not present

## 2024-02-15 DIAGNOSIS — Z79899 Other long term (current) drug therapy: Secondary | ICD-10-CM | POA: Insufficient documentation

## 2024-02-15 DIAGNOSIS — E7800 Pure hypercholesterolemia, unspecified: Secondary | ICD-10-CM | POA: Diagnosis not present

## 2024-02-15 DIAGNOSIS — Z9181 History of falling: Secondary | ICD-10-CM | POA: Insufficient documentation

## 2024-02-15 DIAGNOSIS — Z952 Presence of prosthetic heart valve: Secondary | ICD-10-CM | POA: Insufficient documentation

## 2024-02-15 DIAGNOSIS — Z8249 Family history of ischemic heart disease and other diseases of the circulatory system: Secondary | ICD-10-CM | POA: Diagnosis not present

## 2024-02-15 DIAGNOSIS — I4891 Unspecified atrial fibrillation: Secondary | ICD-10-CM

## 2024-02-15 DIAGNOSIS — Z9889 Other specified postprocedural states: Secondary | ICD-10-CM

## 2024-02-15 DIAGNOSIS — Z951 Presence of aortocoronary bypass graft: Secondary | ICD-10-CM | POA: Diagnosis not present

## 2024-02-15 NOTE — Patient Instructions (Signed)
 There has been no changes to your medications.  You have been referred to the Electrophysiologist. This office will call you to arrange your appointment.  Your physician recommends that you schedule a follow-up appointment in: 3 months ( March 2026) ** PLEASE CALL THE OFFICE IN MID JANUARY TO ARRANGE YOUR FOLLOW UP APPOINTMENT.**  If you have any questions or concerns before your next appointment please send us  a message through [place] or call our office at [phone].    TO LEAVE A MESSAGE FOR THE NURSE SELECT OPTION 2, PLEASE LEAVE A MESSAGE INCLUDING: YOUR NAME DATE OF BIRTH CALL BACK NUMBER REASON FOR CALL**this is important as we prioritize the call backs  YOU WILL RECEIVE A CALL BACK THE SAME DAY AS LONG AS YOU CALL BEFORE 4:00 PM  At the [facility], you and your health needs are our priority. As part of our continuing mission to provide you with exceptional heart care, we have created designated Provider Care Teams. These Care Teams include your primary Cardiologist (physician) and Advanced Practice Providers (APPs- Physician Assistants and Nurse Practitioners) who all work together to provide you with the care you need, when you need it.   You may see any of the following providers on your designated Care Team at your next follow up: Dr Toribio Fuel Dr Ezra Shuck Dr. Morene Brownie Greig Mosses, NP Caffie Shed, [place] [facility] [address], [place] Beckey Coe, NP Jordan Lee, NP Ellouise Class, NP Tinnie Redman, PharmD Jaun Bash, PharmD   Please be sure to bring in all your medications bottles to every appointment.    Thank you for choosing [facility]

## 2024-03-01 ENCOUNTER — Other Ambulatory Visit (HOSPITAL_COMMUNITY): Payer: Self-pay | Admitting: Cardiology

## 2024-06-03 ENCOUNTER — Ambulatory Visit (HOSPITAL_COMMUNITY): Admitting: Cardiology
